# Patient Record
Sex: Female | Born: 1940
Health system: Southern US, Community
[De-identification: ages and names within clinical notes are randomized; demographics above are authoritative.]

## PROBLEM LIST (undated history)

## (undated) DIAGNOSIS — F32A Depression, unspecified: Secondary | ICD-10-CM

## (undated) DIAGNOSIS — E785 Hyperlipidemia, unspecified: Secondary | ICD-10-CM

## (undated) DIAGNOSIS — M1990 Unspecified osteoarthritis, unspecified site: Secondary | ICD-10-CM

## (undated) DIAGNOSIS — F419 Anxiety disorder, unspecified: Secondary | ICD-10-CM

## (undated) DIAGNOSIS — C801 Malignant (primary) neoplasm, unspecified: Secondary | ICD-10-CM

## (undated) DIAGNOSIS — M8580 Other specified disorders of bone density and structure, unspecified site: Secondary | ICD-10-CM

## (undated) HISTORY — DX: Other specified disorders of bone density and structure, unspecified site: M85.80

## (undated) HISTORY — PX: CARPAL TUNNEL RELEASE: SHX101

## (undated) HISTORY — DX: Anxiety disorder, unspecified: F41.9

## (undated) HISTORY — DX: Hyperlipidemia, unspecified: E78.5

## (undated) HISTORY — PX: DILATION AND CURETTAGE OF UTERUS: SHX78

---

## 1998-10-21 ENCOUNTER — Other Ambulatory Visit: Admission: RE | Admit: 1998-10-21 | Discharge: 1998-10-21 | Payer: Self-pay | Admitting: Obstetrics and Gynecology

## 1999-11-26 ENCOUNTER — Other Ambulatory Visit: Admission: RE | Admit: 1999-11-26 | Discharge: 1999-11-26 | Payer: Self-pay | Admitting: Obstetrics and Gynecology

## 2000-12-06 ENCOUNTER — Other Ambulatory Visit: Admission: RE | Admit: 2000-12-06 | Discharge: 2000-12-06 | Payer: Self-pay | Admitting: Obstetrics and Gynecology

## 2001-12-07 ENCOUNTER — Other Ambulatory Visit: Admission: RE | Admit: 2001-12-07 | Discharge: 2001-12-07 | Payer: Self-pay | Admitting: Obstetrics and Gynecology

## 2002-12-10 ENCOUNTER — Other Ambulatory Visit: Admission: RE | Admit: 2002-12-10 | Discharge: 2002-12-10 | Payer: Self-pay | Admitting: Obstetrics and Gynecology

## 2003-12-16 ENCOUNTER — Other Ambulatory Visit: Admission: RE | Admit: 2003-12-16 | Discharge: 2003-12-16 | Payer: Self-pay | Admitting: Obstetrics and Gynecology

## 2005-04-02 ENCOUNTER — Other Ambulatory Visit: Admission: RE | Admit: 2005-04-02 | Discharge: 2005-04-02 | Payer: Self-pay | Admitting: Obstetrics and Gynecology

## 2006-04-19 ENCOUNTER — Other Ambulatory Visit: Admission: RE | Admit: 2006-04-19 | Discharge: 2006-04-19 | Payer: Self-pay | Admitting: Obstetrics and Gynecology

## 2007-09-19 LAB — HM COLONOSCOPY

## 2008-04-04 LAB — HM PAP SMEAR

## 2010-06-24 ENCOUNTER — Telehealth: Payer: Self-pay | Admitting: Internal Medicine

## 2010-06-24 NOTE — Telephone Encounter (Signed)
Pt has medicare  and two additional supplement ins. Pt has rx with no additional refills. Pt would like to become a new pt. Can I sch?

## 2010-07-08 NOTE — Telephone Encounter (Signed)
Per Dr. Fabian Sharp- can do 7/24- at 1:45pm to 2pm

## 2010-07-09 NOTE — Telephone Encounter (Signed)
Pt aware of this appt.

## 2010-08-03 ENCOUNTER — Encounter: Payer: Self-pay | Admitting: Internal Medicine

## 2010-08-11 ENCOUNTER — Ambulatory Visit (INDEPENDENT_AMBULATORY_CARE_PROVIDER_SITE_OTHER): Payer: Medicare Other | Admitting: Internal Medicine

## 2010-08-11 ENCOUNTER — Encounter: Payer: Self-pay | Admitting: Internal Medicine

## 2010-08-11 VITALS — BP 110/60 | HR 66 | Ht 65.25 in | Wt 151.0 lb

## 2010-08-11 DIAGNOSIS — F419 Anxiety disorder, unspecified: Secondary | ICD-10-CM | POA: Insufficient documentation

## 2010-08-11 DIAGNOSIS — E785 Hyperlipidemia, unspecified: Secondary | ICD-10-CM

## 2010-08-11 DIAGNOSIS — Z79890 Hormone replacement therapy (postmenopausal): Secondary | ICD-10-CM

## 2010-08-11 DIAGNOSIS — M899 Disorder of bone, unspecified: Secondary | ICD-10-CM

## 2010-08-11 DIAGNOSIS — M8580 Other specified disorders of bone density and structure, unspecified site: Secondary | ICD-10-CM

## 2010-08-11 DIAGNOSIS — F411 Generalized anxiety disorder: Secondary | ICD-10-CM

## 2010-08-11 NOTE — Patient Instructions (Addendum)
Stop the glucosamine and see how your feel and if get increasing joint pains then  Restart to see  If this ireally helping you. I will review your record about advisability of lipid medications. And interventions. No need to take the red yeast  rice   For now.  Decrease hormones trial to 5 days a week.    Check labs in about 8 weeks and then Wellness visit .

## 2010-08-11 NOTE — Progress Notes (Signed)
  Subjective:    Patient ID: Ina Homes, female    DOB: Mar 05, 1940, 70 y.o.   MRN: [id number]  HPI  New patient  Comes in as a new patient visit and has a few ongoing medical issues . There are med records to review.  Past PCP  Was Dr Raquel James who is no longer in practice . Prev  Dr Ashley Royalty .Marland Kitchen LIPIDS:  On meds for about 18 months .  No problems taking it  Had  Declined statins.   Concern about se.   Low doze  zoloft for anxiety for a while.      Helps.  Red yeast rice   and lovaza .     On prempro" forever".   Had sx  When tried to  stop .   Other  Options  didnt work well.  Review of Systems ? Episode a of chest pain in the past .    ROS:  GEN/ HEENTNo fever, significant weight changes sweats headaches vision problems hearing changes, CV/ PULM; [place], syncope,edema  change in exercise tolerance. GI /GU: No adominal pain, vomiting, change in bowel habits. No blood in the stool. No significant GU symptoms. SKIN/HEME: ,no acute skin rashes suspicious lesions or bleeding. No lymphadenopathy, nodules, masses.  NEURO/ PSYCH:  No neurologic signs such as weakness numbness No depression anxiety. IMM/ Allergy: No unusual infections.  Allergy .   REST of 12 system review negative G4P2 UTD on  HCM parameters Ex light smoker   Reviewed  Data sheet    Objective:   Physical Exam Physical Exam: Vital signs reviewed [id number] is a well-developed well-nourished alert cooperative  white female who appears her stated age in no acute distress.  HEENT: normocephalic  atraumatic , Eyes: PERRL EOM's full, conjunctiva clear, Nares: paten,t no deformity discharge or tenderness., Ears: no deformity EAC's clear TMs with normal landmarks. Mouth: clear OP, no lesions, edema.  Moist mucous membranes. Dentition in adequate repair. NECK: supple without masses, thyromegaly or bruits. CHEST/PULM:  Clear to auscultation and percussion breath sounds equal no wheeze , rales or rhonchi. No chest wall deformities  or tenderness. CV: PMI is nondisplaced, S1 S2 no gallops, murmurs, rubs. Peripheral pulses are full without delay.No JVD .  ABDOMEN: Bowel sounds normal nontender  No guard or rebound, no hepato splenomegal no CVA tenderness.  No  Extremtities:  No clubbing cyanosis or edema, no acute joint swelling or redness no focal atrophy NEURO:  Oriented x3, cranial nerves 3-12 appear to be intact, no obvious focal weakness,gait within normal limits  SKIN: No acute rashes normal turgor, color, no bruising or petechiae. PSYCH: Oriented, good eye contact, no obvious depression anxiety, cognition and judgment appear normal. records     Assessment & Plan:  HRT   Does better on than off per patient Mood  Does well on current med regimen LIPIDS. Disc use of supplements  And lack of credible data  Risk benefit  And outcomes data.  She doesn't have high risk except for age . Osteopenia  Supplements .

## 2010-08-22 ENCOUNTER — Encounter: Payer: Self-pay | Admitting: Internal Medicine

## 2010-08-22 DIAGNOSIS — E785 Hyperlipidemia, unspecified: Secondary | ICD-10-CM | POA: Insufficient documentation

## 2010-08-22 DIAGNOSIS — M8580 Other specified disorders of bone density and structure, unspecified site: Secondary | ICD-10-CM | POA: Insufficient documentation

## 2010-08-22 DIAGNOSIS — F419 Anxiety disorder, unspecified: Secondary | ICD-10-CM | POA: Insufficient documentation

## 2010-08-22 DIAGNOSIS — Z79890 Hormone replacement therapy (postmenopausal): Secondary | ICD-10-CM | POA: Insufficient documentation

## 2010-09-07 ENCOUNTER — Other Ambulatory Visit: Payer: Self-pay | Admitting: Internal Medicine

## 2010-10-09 ENCOUNTER — Other Ambulatory Visit (INDEPENDENT_AMBULATORY_CARE_PROVIDER_SITE_OTHER): Payer: Medicare Other

## 2010-10-09 DIAGNOSIS — M899 Disorder of bone, unspecified: Secondary | ICD-10-CM

## 2010-10-09 DIAGNOSIS — E785 Hyperlipidemia, unspecified: Secondary | ICD-10-CM

## 2010-10-09 LAB — CBC WITH DIFFERENTIAL/PLATELET
Basophils Relative: 0.9 % (ref 0.0–3.0)
Eosinophils Relative: 3.4 % (ref 0.0–5.0)
Lymphocytes Relative: 27.2 % (ref 12.0–46.0)
MCV: 94.7 fl (ref 78.0–100.0)
Monocytes Absolute: 0.5 10*3/uL (ref 0.1–1.0)
Neutrophils Relative %: 58.4 % (ref 43.0–77.0)
Platelets: 280 10*3/uL (ref 150.0–400.0)
RBC: 4.08 Mil/uL (ref 3.87–5.11)
WBC: 5.2 10*3/uL (ref 4.5–10.5)

## 2010-10-09 LAB — LIPID PANEL
Cholesterol: 170 mg/dL (ref 0–200)
LDL Cholesterol: 102 mg/dL — ABNORMAL HIGH (ref 0–99)
Total CHOL/HDL Ratio: 4
Triglycerides: 117 mg/dL (ref 0.0–149.0)
VLDL: 23.4 mg/dL (ref 0.0–40.0)

## 2010-10-09 LAB — HEPATIC FUNCTION PANEL
ALT: 68 U/L — ABNORMAL HIGH (ref 0–35)
Alkaline Phosphatase: 44 U/L (ref 39–117)
Bilirubin, Direct: 0 mg/dL (ref 0.0–0.3)
Total Bilirubin: 0.4 mg/dL (ref 0.3–1.2)
Total Protein: 7.3 g/dL (ref 6.0–8.3)

## 2010-10-09 LAB — BASIC METABOLIC PANEL
BUN: 18 mg/dL (ref 6–23)
Calcium: 9.1 mg/dL (ref 8.4–10.5)
Creatinine, Ser: 0.7 mg/dL (ref 0.4–1.2)
GFR: 92.46 mL/min (ref >60.00)

## 2010-10-15 ENCOUNTER — Encounter: Payer: Medicare Other | Admitting: Internal Medicine

## 2010-10-27 ENCOUNTER — Encounter: Payer: Self-pay | Admitting: Internal Medicine

## 2010-10-27 ENCOUNTER — Ambulatory Visit (INDEPENDENT_AMBULATORY_CARE_PROVIDER_SITE_OTHER): Payer: Medicare Other | Admitting: Internal Medicine

## 2010-10-27 DIAGNOSIS — Z0000 Encounter for general adult medical examination without abnormal findings: Secondary | ICD-10-CM

## 2010-10-27 DIAGNOSIS — F419 Anxiety disorder, unspecified: Secondary | ICD-10-CM

## 2010-10-27 DIAGNOSIS — Z23 Encounter for immunization: Secondary | ICD-10-CM

## 2010-10-27 DIAGNOSIS — Z136 Encounter for screening for cardiovascular disorders: Secondary | ICD-10-CM

## 2010-10-27 DIAGNOSIS — E785 Hyperlipidemia, unspecified: Secondary | ICD-10-CM

## 2010-10-27 DIAGNOSIS — Z79890 Hormone replacement therapy (postmenopausal): Secondary | ICD-10-CM

## 2010-10-27 DIAGNOSIS — R945 Abnormal results of liver function studies: Secondary | ICD-10-CM

## 2010-10-27 DIAGNOSIS — F411 Generalized anxiety disorder: Secondary | ICD-10-CM

## 2010-10-27 DIAGNOSIS — M949 Disorder of cartilage, unspecified: Secondary | ICD-10-CM

## 2010-10-27 DIAGNOSIS — M8580 Other specified disorders of bone density and structure, unspecified site: Secondary | ICD-10-CM

## 2010-10-27 NOTE — Patient Instructions (Addendum)
Avoid advil aleve . Alcohol.   Check labs in 1 month and then plan follow up  After that.   Wean hormone. To 3- 4 days per week as discussed . Get sleep.

## 2010-10-27 NOTE — Progress Notes (Signed)
Subjective:    Summer Collins is a 70 y.o. female who presents for a  Annual Medicare Wellness Visit. And other problems  Patient comes in today for preventive visit and follow-up of medical issues. Decreasing  hrt .   Update of her history since her last visit. Stopped the fish oil and glucosamine  And less hrt Has seen eye doc Cardiac risk factors: advanced age (older than 35 for men, 20 for women) and dyslipidemia.  Activities of Daily Living  In your present state of health, do you have any difficulty performing the following activities?:  Preparing food and eating?: No Bathing yourself: No Getting dressed: No Using the toilet:No Moving around from place to place: No In the past year have you fallen or had a near fall?:Yes  Current exercise habits: The patient does not participate in regular exercise at present.   Dietary issues discussed: healthy eating and exercise  Depression Screen (Note: if answer to either of the following is "Yes", then a more complete depression screening is indicated)  Q1: Over the past two weeks, have you felt down, depressed or hopeless?no Q2: Over the past two weeks, have you felt little interest or pleasure in doing things? no    Hearing: good  Vision:  No limitations at present .  roseacea of eyes   And had steroid treatment and  Another drop.  Safety:  Has smoke detector and wears seat belts.  No firearms. No excess sun exposure. Sees dentist regularly.  Falls: no fall of injury in last 6 months  Advance directive :  Reviewed  Has one.  Memory: Felt to be good  , no concern from her or her family.  Depression: No anhedonia unusual crying or depressive symptoms  Nutrition: Eats well balanced diet; adequate calcium and vitamin D. No swallowing chewiing problems.  Injury: no major injuries in the last six months.  Other healthcare providers:  Reviewed today .  Social:  Lives   With husband  . No pets.   Preventive parameters:  up-to-date on colonoscopy, mammogram, immunizations. Including Tdap and pneumovax.  ADLS:   There are no problems or need for assistance  driving, feeding, obtaining food, dressing, toileting and bathing, managing money using phone. She is independent.    Review of Systems ROS:  GEN/ HEENTNo fever, significant weight changes sweats headaches vision problems hearing changes, CV/ PULM; No chest pain shortness of breath cough, syncope,edema  change in exercise tolerance. GI /GU: No adominal pain, vomiting, change in bowel habits. No blood in the stool. No significant GU symptoms. SKIN/HEME: ,no acute skin rashes suspicious lesions or bleeding. No lymphadenopathy, nodules, masses.  NEURO/ PSYCH:  No neurologic signs such as weakness numbness No depression anxiety. IMM/ Allergy: No unusual infections.  Allergy .   REST of 12 system review negative  Past history family history social history reviewed in the electronic medical record.    Objective:     Vision done 09/19/2010 normal except for roceace of the eyeballs done by unsure of name. Blood pressure 110/70, pulse 70, height 5' 5.5" (1.664 m), weight 154 lb (69.854 kg). Body mass index is 25.24 kg/(m^2). Physical Exam: Vital signs reviewed [id number] is a well-developed well-nourished alert cooperative  white female who appears her stated age in no acute distress.  HEENT: normocephalic  traumatic , Eyes: PERRL EOM's full, conjunctiva clear, Nares: paten,t no deformity discharge or tenderness., Ears: no deformity EAC's clear TMs with normal landmarks. Mouth: clear OP, no lesions, edema.  Moist mucous membranes. Dentition in adequate repair. NECK: supple without masses, thyromegaly or bruits. Breast: normal by inspection . No dimpling, discharge, masses, tenderness or discharge .  CHEST/PULM:  Clear to auscultation and percussion breath sounds equal no wheeze , rales or rhonchi. No chest wall deformities or tenderness. CV: PMI is  nondisplaced, S1 S2 no gallops, murmurs, rubs. Peripheral pulses are full without delay.No JVD .  ABDOMEN: Bowel sounds normal nontender  No guard or rebound, no hepato splenomegal no CVA tenderness.  No hernia. Extremtities:  No clubbing cyanosis or edema, no acute joint swelling or redness no focal atrophy NEURO:  Oriented x3, cranial nerves 3-12 appear to be intact, no obvious focal weakness,gait within normal limits no abnormal reflexes or asymmetrical SKIN: No acute rashes normal turgor, color, no bruising or petechiae. PSYCH: Oriented, good eye contact, no obvious depression anxiety, cognition and judgment appear normal.   Pelvic: NL ext GU, labia clear without lesions or rash . Vagina no lesions . UTERUS: Neg CMT Adnexa:  clear no masses,   EKG nsr no acutefindings   Assessment:   Preventive Health Care Counseled regarding healthy nutrition, exercise, sleep, injury prevention, calcium vit d and healthy weight .  LIPIDS taking meds no se  LFTS  No excessive etoh some nsaid tylenol for ms pain HRT weaning Osteopenia hx no fracture  Mood hrt  Ok so far  Abnormal lfts  Minor but needs fu no hx of ivdu risk of hep etc  recheck     Plan:    Seeabove During the course of the visit the patient was educated and counseled about appropriate screening and preventive services including:     Patient Instructions (the written plan) was given to the patient.   Medicare Attestation I have personally reviewed: The patient's medical and social history Their use of alcohol, tobacco or illicit drugs Their current medications and supplements The patient's functional ability including ADLs,fall risks, home safety risks, cognitive, and hearing and visual impairment Diet and physical activities Evidence for depression or mood disorders  The patient's weight, height, BMI, and visual acuity have been recorded in the chart.  I have made referrals, counseling, and provided education to the patient  based on review of the above and I have provided the patient with a written personalized care plan for preventive services.

## 2010-10-31 DIAGNOSIS — Z0000 Encounter for general adult medical examination without abnormal findings: Secondary | ICD-10-CM | POA: Insufficient documentation

## 2010-10-31 DIAGNOSIS — R945 Abnormal results of liver function studies: Secondary | ICD-10-CM | POA: Insufficient documentation

## 2010-10-31 DIAGNOSIS — Z136 Encounter for screening for cardiovascular disorders: Secondary | ICD-10-CM | POA: Insufficient documentation

## 2010-11-02 DIAGNOSIS — Z23 Encounter for immunization: Secondary | ICD-10-CM

## 2010-11-23 ENCOUNTER — Other Ambulatory Visit (INDEPENDENT_AMBULATORY_CARE_PROVIDER_SITE_OTHER): Payer: Medicare Other

## 2010-11-23 DIAGNOSIS — R945 Abnormal results of liver function studies: Secondary | ICD-10-CM

## 2010-11-23 DIAGNOSIS — K769 Liver disease, unspecified: Secondary | ICD-10-CM

## 2010-11-23 LAB — HEPATIC FUNCTION PANEL
ALT: 38 U/L — ABNORMAL HIGH (ref 0–35)
Bilirubin, Direct: 0 mg/dL (ref 0.0–0.3)
Total Bilirubin: 0.4 mg/dL (ref 0.3–1.2)
Total Protein: 7.4 g/dL (ref 6.0–8.3)

## 2010-11-24 ENCOUNTER — Other Ambulatory Visit: Payer: Medicare Other

## 2010-11-24 LAB — HEPATITIS B SURFACE ANTIGEN: Hepatitis B Surface Ag: NEGATIVE

## 2010-11-24 LAB — HEPATITIS C ANTIBODY: HCV Ab: NEGATIVE

## 2010-11-24 LAB — HEPATITIS B SURFACE ANTIBODY,QUALITATIVE: Hep B S Ab: NEGATIVE

## 2010-11-27 ENCOUNTER — Encounter: Payer: Self-pay | Admitting: *Deleted

## 2010-11-27 ENCOUNTER — Other Ambulatory Visit: Payer: Self-pay | Admitting: Internal Medicine

## 2010-11-27 DIAGNOSIS — R945 Abnormal results of liver function studies: Secondary | ICD-10-CM

## 2011-01-05 ENCOUNTER — Other Ambulatory Visit: Payer: Self-pay | Admitting: Internal Medicine

## 2011-01-05 MED ORDER — CONJ ESTROG-MEDROXYPROGEST ACE 0.3-1.5 MG PO TABS: 1.0000 | ORAL_TABLET | Freq: Every day | ORAL | Status: DC

## 2011-01-05 NOTE — Telephone Encounter (Signed)
Pt need refills on prempro call into [facility]

## 2011-01-11 ENCOUNTER — Telehealth: Payer: Self-pay | Admitting: *Deleted

## 2011-01-11 MED ORDER — SERTRALINE HCL 50 MG PO TABS: 50.0000 mg | ORAL_TABLET | Freq: Every day | ORAL | Status: DC

## 2011-01-11 NOTE — Telephone Encounter (Signed)
Refill on zoloft  

## 2011-04-08 DIAGNOSIS — H01009 Unspecified blepharitis unspecified eye, unspecified eyelid: Secondary | ICD-10-CM | POA: Diagnosis not present

## 2011-04-08 DIAGNOSIS — L719 Rosacea, unspecified: Secondary | ICD-10-CM | POA: Diagnosis not present

## 2011-07-29 DIAGNOSIS — Z1231 Encounter for screening mammogram for malignant neoplasm of breast: Secondary | ICD-10-CM | POA: Diagnosis not present

## 2011-08-05 ENCOUNTER — Encounter: Payer: Self-pay | Admitting: Internal Medicine

## 2011-08-15 ENCOUNTER — Other Ambulatory Visit: Payer: Self-pay | Admitting: Internal Medicine

## 2011-08-16 DIAGNOSIS — C44611 Basal cell carcinoma of skin of unspecified upper limb, including shoulder: Secondary | ICD-10-CM | POA: Diagnosis not present

## 2011-08-16 DIAGNOSIS — D485 Neoplasm of uncertain behavior of skin: Secondary | ICD-10-CM | POA: Diagnosis not present

## 2011-08-16 DIAGNOSIS — L821 Other seborrheic keratosis: Secondary | ICD-10-CM | POA: Diagnosis not present

## 2011-08-16 DIAGNOSIS — D2330 Other benign neoplasm of skin of unspecified part of face: Secondary | ICD-10-CM | POA: Diagnosis not present

## 2011-08-17 NOTE — Telephone Encounter (Signed)
Needs refills.  Last seen 10-27-10.  No fu scheduled.  Please advise.

## 2011-08-18 NOTE — Telephone Encounter (Signed)
Due for visit in October  Have her schedule appt then and refill med until then

## 2011-08-27 DIAGNOSIS — L570 Actinic keratosis: Secondary | ICD-10-CM | POA: Diagnosis not present

## 2011-08-27 DIAGNOSIS — C44611 Basal cell carcinoma of skin of unspecified upper limb, including shoulder: Secondary | ICD-10-CM | POA: Diagnosis not present

## 2011-09-10 ENCOUNTER — Other Ambulatory Visit: Payer: Self-pay | Admitting: Internal Medicine

## 2011-09-10 NOTE — Telephone Encounter (Signed)
Needs CPE scheduled for Oct.

## 2011-09-13 NOTE — Telephone Encounter (Signed)
Pt is sch for 12-15-2011. Pt is out of med

## 2011-10-04 DIAGNOSIS — H01009 Unspecified blepharitis unspecified eye, unspecified eyelid: Secondary | ICD-10-CM | POA: Diagnosis not present

## 2011-10-04 DIAGNOSIS — H2510 Age-related nuclear cataract, unspecified eye: Secondary | ICD-10-CM | POA: Diagnosis not present

## 2011-10-04 DIAGNOSIS — L719 Rosacea, unspecified: Secondary | ICD-10-CM | POA: Diagnosis not present

## 2011-12-13 ENCOUNTER — Other Ambulatory Visit: Payer: Self-pay | Admitting: Internal Medicine

## 2011-12-15 ENCOUNTER — Encounter: Payer: Self-pay | Admitting: Internal Medicine

## 2011-12-15 ENCOUNTER — Ambulatory Visit (INDEPENDENT_AMBULATORY_CARE_PROVIDER_SITE_OTHER): Payer: Medicare Other | Admitting: Internal Medicine

## 2011-12-15 VITALS — BP 124/70 | HR 73 | Temp 98.4°F | Ht 65.5 in | Wt 154.0 lb

## 2011-12-15 DIAGNOSIS — Z23 Encounter for immunization: Secondary | ICD-10-CM

## 2011-12-15 DIAGNOSIS — L719 Rosacea, unspecified: Secondary | ICD-10-CM | POA: Diagnosis not present

## 2011-12-15 DIAGNOSIS — Z0000 Encounter for general adult medical examination without abnormal findings: Secondary | ICD-10-CM

## 2011-12-15 DIAGNOSIS — Z01419 Encounter for gynecological examination (general) (routine) without abnormal findings: Secondary | ICD-10-CM | POA: Diagnosis not present

## 2011-12-15 DIAGNOSIS — F419 Anxiety disorder, unspecified: Secondary | ICD-10-CM

## 2011-12-15 DIAGNOSIS — F411 Generalized anxiety disorder: Secondary | ICD-10-CM

## 2011-12-15 DIAGNOSIS — M949 Disorder of cartilage, unspecified: Secondary | ICD-10-CM

## 2011-12-15 DIAGNOSIS — Z85828 Personal history of other malignant neoplasm of skin: Secondary | ICD-10-CM | POA: Insufficient documentation

## 2011-12-15 DIAGNOSIS — Z79890 Hormone replacement therapy (postmenopausal): Secondary | ICD-10-CM | POA: Diagnosis not present

## 2011-12-15 DIAGNOSIS — M899 Disorder of bone, unspecified: Secondary | ICD-10-CM | POA: Diagnosis not present

## 2011-12-15 DIAGNOSIS — L718 Other rosacea: Secondary | ICD-10-CM | POA: Insufficient documentation

## 2011-12-15 DIAGNOSIS — M8580 Other specified disorders of bone density and structure, unspecified site: Secondary | ICD-10-CM

## 2011-12-15 DIAGNOSIS — E785 Hyperlipidemia, unspecified: Secondary | ICD-10-CM

## 2011-12-15 LAB — HEPATIC FUNCTION PANEL
ALT: 37 U/L — ABNORMAL HIGH (ref 0–35)
AST: 31 U/L (ref 0–37)
Albumin: 4 g/dL (ref 3.5–5.2)
Alkaline Phosphatase: 38 U/L — ABNORMAL LOW (ref 39–117)
Total Protein: 7.7 g/dL (ref 6.0–8.3)

## 2011-12-15 LAB — CBC WITH DIFFERENTIAL/PLATELET
Basophils Absolute: 0.1 10*3/uL (ref 0.0–0.1)
Basophils Relative: 1 % (ref 0.0–3.0)
Eosinophils Relative: 2.7 % (ref 0.0–5.0)
HCT: 40.5 % (ref 36.0–46.0)
Hemoglobin: 13.4 g/dL (ref 12.0–15.0)
Lymphocytes Relative: 34.1 % (ref 12.0–46.0)
Lymphs Abs: 1.9 10*3/uL (ref 0.7–4.0)
Monocytes Relative: 11.2 % (ref 3.0–12.0)
Neutro Abs: 2.9 10*3/uL (ref 1.4–7.7)
RBC: 4.34 Mil/uL (ref 3.87–5.11)
RDW: 13.3 % (ref 11.5–14.6)
WBC: 5.7 10*3/uL (ref 4.5–10.5)

## 2011-12-15 LAB — BASIC METABOLIC PANEL
CO2: 27 mEq/L (ref 19–32)
Calcium: 9.5 mg/dL (ref 8.4–10.5)
Creatinine, Ser: 0.7 mg/dL (ref 0.4–1.2)
GFR: 89.07 mL/min (ref >60.00)
Sodium: 138 mEq/L (ref 135–145)

## 2011-12-15 LAB — LIPID PANEL
Cholesterol: 161 mg/dL (ref 0–200)
VLDL: 27.2 mg/dL (ref 0.0–40.0)

## 2011-12-15 LAB — TSH: TSH: 2.48 u[IU]/mL (ref 0.35–5.50)

## 2011-12-15 NOTE — Patient Instructions (Signed)
Continue minimize hrt as needed.  Exercise can help  Maintain bone health  And cardio vascular health.  Will notify you  of labs when available.  Preventive Care for Adults, Female A healthy lifestyle and preventive care can promote health and wellness. Preventive health guidelines for women include the following key practices.  A routine yearly physical is a good way to check with your caregiver about your health and preventive screening. It is a chance to share any concerns and updates on your health, and to receive a thorough exam.  Visit your dentist for a routine exam and preventive care every 6 months. Brush your teeth twice a day and floss once a day. Good oral hygiene prevents tooth decay and gum disease.  The frequency of eye exams is based on your age, health, family medical history, use of contact lenses, and other factors. Follow your caregiver's recommendations for frequency of eye exams.  Eat a healthy diet. Foods like vegetables, fruits, whole grains, low-fat dairy products, and lean protein foods contain the nutrients you need without too many calories. Decrease your intake of foods high in solid fats, added sugars, and salt. Eat the right amount of calories for you.Get information about a proper diet from your caregiver, if necessary.  Regular physical exercise is one of the most important things you can do for your health. Most adults should get at least 150 minutes of moderate-intensity exercise (any activity that increases your heart rate and causes you to sweat) each week. In addition, most adults need muscle-strengthening exercises on 2 or more days a week.  Maintain a healthy weight. The body mass index (BMI) is a screening tool to identify possible weight problems. It provides an estimate of body fat based on height and weight. Your caregiver can help determine your BMI, and can help you achieve or maintain a healthy weight.For adults 20 years and older:  A BMI below 18.5  is considered underweight.  A BMI of 18.5 to 24.9 is normal.  A BMI of 25 to 29.9 is considered overweight.  A BMI of 30 and above is considered obese.  Maintain normal blood lipids and cholesterol levels by exercising and minimizing your intake of saturated fat. Eat a balanced diet with plenty of fruit and vegetables. Blood tests for lipids and cholesterol should begin at age 64 and be repeated every 5 years. If your lipid or cholesterol levels are high, you are over 50, or you are at high risk for heart disease, you may need your cholesterol levels checked more frequently.Ongoing high lipid and cholesterol levels should be treated with medicines if diet and exercise are not effective.  If you smoke, find out from your caregiver how to quit. If you do not use tobacco, do not start.  If you are pregnant, do not drink alcohol. If you are breastfeeding, be very cautious about drinking alcohol. If you are not pregnant and choose to drink alcohol, do not exceed 1 drink per day. One drink is considered to be 12 ounces (355 mL) of beer, 5 ounces (148 mL) of wine, or 1.5 ounces (44 mL) of liquor.  Avoid use of street drugs. Do not share needles with anyone. Ask for help if you need support or instructions about stopping the use of drugs.  High blood pressure causes heart disease and increases the risk of stroke. Your blood pressure should be checked at least every 1 to 2 years. Ongoing high blood pressure should be treated with medicines if weight  loss and exercise are not effective.  If you are 30 to 71 years old, ask your caregiver if you should take aspirin to prevent strokes.  Diabetes screening involves taking a blood sample to check your fasting blood sugar level. This should be done once every 3 years, after age 24, if you are within normal weight and without risk factors for diabetes. Testing should be considered at a younger age or be carried out more frequently if you are overweight and have  at least 1 risk factor for diabetes.  Breast cancer screening is essential preventive care for women. You should practice "breast self-awareness." This means understanding the normal appearance and feel of your breasts and may include breast self-examination. Any changes detected, no matter how small, should be reported to a caregiver. Women in their 33s and 30s should have a clinical breast exam (CBE) by a caregiver as part of a regular health exam every 1 to 3 years. After age 30, women should have a CBE every year. Starting at age 60, women should consider having a mammography (breast X-ray test) every year. Women who have a family history of breast cancer should talk to their caregiver about genetic screening. Women at a high risk of breast cancer should talk to their caregivers about having magnetic resonance imaging (MRI) and a mammography every year.  The Pap test is a screening test for cervical cancer. A Pap test can show cell changes on the cervix that might become cervical cancer if left untreated. A Pap test is a procedure in which cells are obtained and examined from the lower end of the uterus (cervix).  Women should have a Pap test starting at age 70.  Between ages 88 and 42, Pap tests should be repeated every 2 years.  Beginning at age 73, you should have a Pap test every 3 years as long as the past 3 Pap tests have been normal.  Some women have medical problems that increase the chance of getting cervical cancer. Talk to your caregiver about these problems. It is especially important to talk to your caregiver if a new problem develops soon after your last Pap test. In these cases, your caregiver may recommend more frequent screening and Pap tests.  The above recommendations are the same for women who have or have not gotten the vaccine for human papillomavirus (HPV).  If you had a hysterectomy for a problem that was not cancer or a condition that could lead to cancer, then you no  longer need Pap tests. Even if you no longer need a Pap test, a regular exam is a good idea to make sure no other problems are starting.  If you are between ages 55 and 34, and you have had normal Pap tests going back 10 years, you no longer need Pap tests. Even if you no longer need a Pap test, a regular exam is a good idea to make sure no other problems are starting.  If you have had past treatment for cervical cancer or a condition that could lead to cancer, you need Pap tests and screening for cancer for at least 20 years after your treatment.  If Pap tests have been discontinued, risk factors (such as a new sexual partner) need to be reassessed to determine if screening should be resumed.  The HPV test is an additional test that may be used for cervical cancer screening. The HPV test looks for the virus that can cause the cell changes on the cervix.  The cells collected during the Pap test can be tested for HPV. The HPV test could be used to screen women aged 70 years and older, and should be used in women of any age who have unclear Pap test results. After the age of 38, women should have HPV testing at the same frequency as a Pap test.  Colorectal cancer can be detected and often prevented. Most routine colorectal cancer screening begins at the age of 57 and continues through age 38. However, your caregiver may recommend screening at an earlier age if you have risk factors for colon cancer. On a yearly basis, your caregiver may provide home test kits to check for hidden blood in the stool. Use of a small camera at the end of a tube, to directly examine the colon (sigmoidoscopy or colonoscopy), can detect the earliest forms of colorectal cancer. Talk to your caregiver about this at age 76, when routine screening begins. Direct examination of the colon should be repeated every 5 to 10 years through age 60, unless early forms of pre-cancerous polyps or small growths are found.  Hepatitis C blood  testing is recommended for all people born from [birth year] through 1965 and any individual with known risks for hepatitis C.  Practice safe sex. Use condoms and avoid high-risk sexual practices to reduce the spread of sexually transmitted infections (STIs). STIs include gonorrhea, chlamydia, syphilis, trichomonas, herpes, HPV, and human immunodeficiency virus (HIV). Herpes, HIV, and HPV are viral illnesses that have no cure. They can result in disability, cancer, and death. Sexually active women aged 80 and younger should be checked for chlamydia. Older women with new or multiple partners should also be tested for chlamydia. Testing for other STIs is recommended if you are sexually active and at increased risk.  Osteoporosis is a disease in which the bones lose minerals and strength with aging. This can result in serious bone fractures. The risk of osteoporosis can be identified using a bone density scan. Women ages 48 and over and women at risk for fractures or osteoporosis should discuss screening with their caregivers. Ask your caregiver whether you should take a calcium supplement or vitamin D to reduce the rate of osteoporosis.  Menopause can be associated with physical symptoms and risks. Hormone replacement therapy is available to decrease symptoms and risks. You should talk to your caregiver about whether hormone replacement therapy is right for you.  Use sunscreen with sun protection factor (SPF) of 30 or more. Apply sunscreen liberally and repeatedly throughout the day. You should seek shade when your shadow is shorter than you. Protect yourself by wearing long sleeves, pants, a wide-brimmed hat, and sunglasses year round, whenever you are outdoors.  Once a month, do a whole body skin exam, using a mirror to look at the skin on your back. Notify your caregiver of new moles, moles that have irregular borders, moles that are larger than a pencil eraser, or moles that have changed in shape or  color.  Stay current with required immunizations.  Influenza. You need a dose every fall (or winter). The composition of the flu vaccine changes each year, so being vaccinated once is not enough.  Pneumococcal polysaccharide. You need 1 to 2 doses if you smoke cigarettes or if you have certain chronic medical conditions. You need 1 dose at age 34 (or older) if you have never been vaccinated.  Tetanus, diphtheria, pertussis (Tdap, Td). Get 1 dose of Tdap vaccine if you are younger than age 69, are over  65 and have contact with an infant, are a Research scientist (physical sciences), are pregnant, or simply want to be protected from whooping cough. After that, you need a Td booster dose every 10 years. Consult your caregiver if you have not had at least 3 tetanus and diphtheria-containing shots sometime in your life or have a deep or dirty wound.  HPV. You need this vaccine if you are a woman age 23 or younger. The vaccine is given in 3 doses over 6 months.  Measles, mumps, rubella (MMR). You need at least 1 dose of MMR if you were born in 1957 or later. You may also need a second dose.  Meningococcal. If you are age 68 to 45 and a first-year college student living in a residence hall, or have one of several medical conditions, you need to get vaccinated against meningococcal disease. You may also need additional booster doses.  Zoster (shingles). If you are age 80 or older, you should get this vaccine.  Varicella (chickenpox). If you have never had chickenpox or you were vaccinated but received only 1 dose, talk to your caregiver to find out if you need this vaccine.  Hepatitis A. You need this vaccine if you have a specific risk factor for hepatitis A virus infection or you simply wish to be protected from this disease. The vaccine is usually given as 2 doses, 6 to 18 months apart.  Hepatitis B. You need this vaccine if you have a specific risk factor for hepatitis B virus infection or you simply wish to be  protected from this disease. The vaccine is given in 3 doses, usually over 6 months. Preventive Services / Frequency Ages 58 and over  Blood pressure check.** / Every 1 to 2 years.  Lipid and cholesterol check.** / Every 5 years beginning at age 66.  Clinical breast exam.** / Every year after age 55.  Mammogram.** / Every year beginning at age 72 and continuing for as long as you are in good health. Consult with your caregiver.  Pap test.** / Every 3 years starting at age 90 through age 50 or 21 with a 3 consecutive normal Pap tests. Testing can be stopped between 65 and 70 with 3 consecutive normal Pap tests and no abnormal Pap or HPV tests in the past 10 years.  HPV screening.** / Every 3 years from ages 74 through ages 67 or 28 with a history of 3 consecutive normal Pap tests. Testing can be stopped between 65 and 70 with 3 consecutive normal Pap tests and no abnormal Pap or HPV tests in the past 10 years.  Fecal occult blood test (FOBT) of stool. / Every year beginning at age 15 and continuing until age 33. You may not need to do this test if you get a colonoscopy every 10 years.  Flexible sigmoidoscopy or colonoscopy.** / Every 5 years for a flexible sigmoidoscopy or every 10 years for a colonoscopy beginning at age 56 and continuing until age 36.  Hepatitis C blood test.** / For all people born from [birth year] through 1965 and any individual with known risks for hepatitis C.  Osteoporosis screening.** / A one-time screening for women ages 30 and over and women at risk for fractures or osteoporosis.  Skin self-exam. / Monthly.  Influenza immunization.** / Every year.  Pneumococcal polysaccharide immunization.** / 1 dose at age 59 (or older) if you have never been vaccinated.  Tetanus, diphtheria, pertussis (Tdap, Td) immunization. / A one-time dose of Tdap vaccine if you are  over 65 and have contact with an infant, are a Research scientist (physical sciences), or simply want to be protected from whooping  cough. After that, you need a Td booster dose every 10 years.  Varicella immunization.** / Consult your caregiver.  Meningococcal immunization.** / Consult your caregiver.  Hepatitis A immunization.** / Consult your caregiver. 2 doses, 6 to 18 months apart.  Hepatitis B immunization.** / Check with your caregiver. 3 doses, usually over 6 months. ** Family history and personal history of risk and conditions may change your caregiver's recommendations. Document Released: 03/02/2001 Document Revised: 03/29/2011 Document Reviewed: 06/01/2010 [facility] Patient Information 2013 [place], [place].

## 2011-12-15 NOTE — Progress Notes (Signed)
Chief Complaint  Patient presents with  . Annual Exam    Medicare    HPI: Patient comes in today for Preventive Medicare wellness visit . And medication management .  No major injuries, ed visits ,hospitalizations , new medications since last visit. Sees opthalmology  for ocular rosacea and dermatologist she had a skin cancer removed from her right arm. Increase med of Zoloft during x mas. This is to help with maintaining her mood stability she feels the Zoloft has helped her. Lipids continuing the TriCor due for lab tests. Hormone replacement therapy has gone down on the dosing and noticed increased hot flashes so staying on the lowest dose most days of the week. Wants to stay on the medication states that she is aware of risk.   Hearing:  Ok  Vision:  No limitations at present .  Safety:  Has smoke detector and wears seat belts.  No firearms. No excess sun exposure. Sees dentist regularly.  Falls: NONE   Advance directive :  Reviewed  Has one.  Memory: Felt to be good  , no concern from her or her family.  Depression: No anhedonia unusual crying or depressive symptoms  Nutrition: Eats well balanced diet; adequate calcium and vitamin D. No swallowing chewiing problems.  Injury: no major injuries in the last six months.  Other healthcare providers:  Reviewed today .  Social:  Lives with husband married. No pets.   Preventive parameters: up-to-date on colonoscopy, mammogram, immunizations. Including Tdap and pneumovax.  ADLS:   There are no problems or need for assistance  driving, feeding, obtaining food, dressing, toileting and bathing, managing money using phone. She is independent.  etoh few per week.  Remote tobacco off for 15 about 20 years.  sneaky  Exercise not regulary    ROS:  GEN/ HEENT: No fever, significant weight changes sweats headaches vision problems hearing changes, CV/ PULM; No  shortness of breath cough, syncope,edema  change in exercise tolerance. Had   episode of nocturnal chest pain without associated cyst symptoms took aspirin no heartburn per se no chest pain dyspnea on exertion that is change from baseline GI /GU: No adominal pain, vomiting, change in bowel habits. No blood in the stool. No significant GU symptoms. SKIN/HEME: ,no acute skin rashes suspicious lesions or bleeding. No lymphadenopathy, nodules, masses.  NEURO/ PSYCH:  No neurologic signs such as weakness numbness. No depression anxiety. Controlled by medication. IMM/ Allergy: No unusual infections.  Allergy .   REST of 12 system review negative except as per HPI   Past Medical History  Diagnosis Date  . Hyperlipidemia   . Anxiety      on low dose zoloft for a while  . Vitamin D deficiency   . Osteopenia     Family History  Problem Relation Age of Onset  . Cancer Father     prostate and spread    History   Social History  . Marital Status: Married    Spouse Name: N/A    Number of Children: N/A  . Years of Education: N/A   Social History Main Topics  . Smoking status: Former Games developer  . Smokeless tobacco: None  . Alcohol Use: 0.5 oz/week    1 drink(s) per week  . Drug Use: No  . Sexually Active: None   Other Topics Concern  . 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    Outpatient Encounter Prescriptions as of 12/15/2011  Medication Sig Dispense Refill  . Calcium Carbonate-Vitamin D (CALCIUM 600 + D PO) Take by mouth. 1 bid         . cholecalciferol (VITAMIN D) 1000 UNITS tablet Take 1,000 Units by mouth daily. 1 daily or bid        . Cyanocobalamin (VITAMIN B 12 PO) Take by mouth. daily       . estrogen, conjugated,-medroxyprogesterone (PREMPRO) 0.3-1.5 MG per tablet Take 1 tablet by mouth daily. Takes 5 days out of the week  30 tablet  11  . fenofibrate (TRICOR) 145 MG tablet TAKE 1 TABLET BY MOUTH  EVERY DAY  90 tablet  0  . sertraline (ZOLOFT) 50 MG tablet TAKE 1 TABLET (50 MG TOTAL) BY MOUTH DAILY.  30 tablet  3  . [DISCONTINUED] fenofibrate (TRICOR) 145 MG tablet TAKE 1 TABLET BY MOUTH EVERY DAY  90 tablet  0     EXAM:  BP 124/70  Pulse 73  Temp 98.4 F (36.9 C) (Oral)  Ht 5' 5.5" (1.664 m)  Wt 154 lb (69.854 kg)  BMI 25.24 kg/m2  SpO2 98%  Body mass index is 25.24 kg/(m^2).  Physical Exam: Vital signs reviewed MWU:XLKG is a well-developed well-nourished alert cooperative   female who appears her stated age in no acute distress.  HEENT: normocephalic atraumatic , Eyes: PERRL EOM's full, conjunctiva clear, Nares: paten,t no deformity discharge or tenderness., Ears: no deformity EAC's clear TMs with normal landmarks. Mouth: clear OP, no lesions, edema.  Moist mucous membranes. Dentition in adequate repair. NECK: supple without masses, thyromegaly or bruits. CHEST/PULM:  Clear to auscultation and percussion breath sounds equal no wheeze , rales or rhonchi. No chest wall deformities or tenderness. Breast; no nodules discharge or dimpling. Axilla is clear CV: PMI is nondisplaced, S1 S2 no gallops, murmurs, rubs. Peripheral pulses are full without delay.No JVD .  ABDOMEN: Bowel sounds normal nontender  No guard or rebound, no hepato splenomegal no CVA tenderness.  No hernia. Extremtities:  No clubbing cyanosis or edema, no acute joint swelling or redness no focal atrophy NEURO:  Oriented x3, cranial nerves 3-12 appear to be intact, no obvious focal weakness,gait within normal limits no abnormal reflexes or asymmetrical SKIN: No acute rashes normal turgor, color, no bruising or petechiae. Sun actinic changes well-healed scar on right forearm. PSYCH: Oriented, good eye contact, no obvious depression anxiety, cognition and judgment appear normal. LN: no cervical axillary inguinal adenopathy Pelvic Pelvic: NL ext GU, labia clear without lesions or rash . Vagina no lesions .Cervix:  clear  UTERUS: Neg CMT Adnexa:  clear no masses . rectal negative masses  Oriented x 3 and no noted deficits in memory, attention, and speech.   Lab Results  Component Value Date   WBC 5.2 10/09/2010   HGB 12.9 10/09/2010   HCT 38.6 10/09/2010   PLT 280.0 10/09/2010   GLUCOSE 91 10/09/2010   CHOL 170 10/09/2010   TRIG 117.0 10/09/2010   HDL 44.90 10/09/2010   LDLCALC 102* 10/09/2010   ALT 38* 11/23/2010   AST 30 11/23/2010   NA 141 10/09/2010   K 4.6 10/09/2010   CL 108 10/09/2010   CREATININE 0.7 10/09/2010   BUN 18 10/09/2010   CO2 27 10/09/2010   TSH 2.61 10/09/2010    ASSESSMENT AND PLAN:  Discussed the following assessment and plan: Preventive Health Care Counseled regarding healthy nutrition, exercise, sleep,  injury prevention, calcium vit d and healthy weight .   1. Medicare annual wellness visit, subsequent    2. Preventative health care  Basic metabolic panel, CBC with Differential, Hepatic function panel, Lipid panel, TSH, Vitamin D 25 hydroxy  3. Hyperlipidemia  Basic metabolic panel, CBC with Differential, Hepatic function panel, Lipid panel, TSH, Vitamin D 25 hydroxy   on tricor for a number of years  checl levelstoday  4. Hormone replacement therapy (postmenopausal)  Basic metabolic panel, CBC with Differential, Hepatic function panel, Lipid panel, TSH, Vitamin D 25 hydroxy   had weaned  at lower dose had hot flushes   5. Anxiety  Basic metabolic panel, CBC with Differential, Hepatic function panel, Lipid panel, TSH, Vitamin D 25 hydroxy   mood controlled by sertraline  ok to continue   6. Need for prophylactic vaccination and inoculation against influenza  Basic metabolic panel, CBC with Differential, Hepatic function panel, Lipid panel, TSH, Vitamin D 25 hydroxy  7. Ocular rosacea  Basic metabolic panel, CBC with Differential, Hepatic function panel, Lipid panel, TSH, Vitamin D 25 hydroxy  8. Osteopenia  Basic metabolic panel, CBC with Differential, Hepatic function panel,  Lipid panel, TSH, Vitamin D 25 hydroxy   check vit d today   9. Hx of nonmelanoma skin cancer     flu  Patient Instructions  Continue minimize hrt as needed.  Exercise can help  Maintain bone health  And cardio vascular health.  Will notify you  of labs when available.  Preventive Care for Adults, Female A healthy lifestyle and preventive care can promote health and wellness. Preventive health guidelines for women include the following key practices.  A routine yearly physical is a good way to check with your caregiver about your health and preventive screening. It is a chance to share any concerns and updates on your health, and to receive a thorough exam.  Visit your dentist for a routine exam and preventive care every 6 months. Brush your teeth twice a day and floss once a day. Good oral hygiene prevents tooth decay and gum disease.  The frequency of eye exams is based on your age, health, family medical history, use of contact lenses, and other factors. Follow your caregiver's recommendations for frequency of eye exams.  Eat a healthy diet. Foods like vegetables, fruits, whole grains, low-fat dairy products, and lean protein foods contain the nutrients you need without too many calories. Decrease your intake of foods high in solid fats, added sugars, and salt. Eat the right amount of calories for you.Get information about a proper diet from your caregiver, if necessary.  Regular physical exercise is one of the most important things you can do for your health. Most adults should get at least 150 minutes of moderate-intensity exercise (any activity that increases your heart rate and causes you to sweat) each week. In addition, most adults need muscle-strengthening exercises on 2 or more days a week.  Maintain a healthy weight. The body mass index (BMI) is a screening tool to identify possible weight problems. It provides an estimate of body fat based on height and weight. Your caregiver  can help determine your BMI, and can help you achieve or maintain a healthy weight.For adults 20 years and older:  A BMI below 18.5 is considered underweight.  A BMI of 18.5 to 24.9 is normal.  A BMI of 25 to 29.9 is considered overweight.  A BMI of 30 and above is considered obese.  Maintain normal blood lipids and  cholesterol levels by exercising and minimizing your intake of saturated fat. Eat a balanced diet with plenty of fruit and vegetables. Blood tests for lipids and cholesterol should begin at age 77 and be repeated every 5 years. If your lipid or cholesterol levels are high, you are over 50, or you are at high risk for heart disease, you may need your cholesterol levels checked more frequently.Ongoing high lipid and cholesterol levels should be treated with medicines if diet and exercise are not effective.  If you smoke, find out from your caregiver how to quit. If you do not use tobacco, do not start.  If you are pregnant, do not drink alcohol. If you are breastfeeding, be very cautious about drinking alcohol. If you are not pregnant and choose to drink alcohol, do not exceed 1 drink per day. One drink is considered to be 12 ounces (355 mL) of beer, 5 ounces (148 mL) of wine, or 1.5 ounces (44 mL) of liquor.  Avoid use of street drugs. Do not share needles with anyone. Ask for help if you need support or instructions about stopping the use of drugs.  High blood pressure causes heart disease and increases the risk of stroke. Your blood pressure should be checked at least every 1 to 2 years. Ongoing high blood pressure should be treated with medicines if weight loss and exercise are not effective.  If you are 54 to 71 years old, ask your caregiver if you should take aspirin to prevent strokes.  Diabetes screening involves taking a blood sample to check your fasting blood sugar level. This should be done once every 3 years, after age 10, if you are within normal weight and without  risk factors for diabetes. Testing should be considered at a younger age or be carried out more frequently if you are overweight and have at least 1 risk factor for diabetes.  Breast cancer screening is essential preventive care for women. You should practice "breast self-awareness." This means understanding the normal appearance and feel of your breasts and may include breast self-examination. Any changes detected, no matter how small, should be reported to a caregiver. Women in their 69s and 30s should have a clinical breast exam (CBE) by a caregiver as part of a regular health exam every 1 to 3 years. After age 60, women should have a CBE every year. Starting at age 4, women should consider having a mammography (breast X-ray test) every year. Women who have a family history of breast cancer should talk to their caregiver about genetic screening. Women at a high risk of breast cancer should talk to their caregivers about having magnetic resonance imaging (MRI) and a mammography every year.  The Pap test is a screening test for cervical cancer. A Pap test can show cell changes on the cervix that might become cervical cancer if left untreated. A Pap test is a procedure in which cells are obtained and examined from the lower end of the uterus (cervix).  Women should have a Pap test starting at age 68.  Between ages 64 and 81, Pap tests should be repeated every 2 years.  Beginning at age 47, you should have a Pap test every 3 years as long as the past 3 Pap tests have been normal.  Some women have medical problems that increase the chance of getting cervical cancer. Talk to your caregiver about these problems. It is especially important to talk to your caregiver if a new problem develops soon after your last  Pap test. In these cases, your caregiver may recommend more frequent screening and Pap tests.  The above recommendations are the same for women who have or have not gotten the vaccine for human  papillomavirus (HPV).  If you had a hysterectomy for a problem that was not cancer or a condition that could lead to cancer, then you no longer need Pap tests. Even if you no longer need a Pap test, a regular exam is a good idea to make sure no other problems are starting.  If you are between ages 46 and 37, and you have had normal Pap tests going back 10 years, you no longer need Pap tests. Even if you no longer need a Pap test, a regular exam is a good idea to make sure no other problems are starting.  If you have had past treatment for cervical cancer or a condition that could lead to cancer, you need Pap tests and screening for cancer for at least 20 years after your treatment.  If Pap tests have been discontinued, risk factors (such as a new sexual partner) need to be reassessed to determine if screening should be resumed.  The HPV test is an additional test that may be used for cervical cancer screening. The HPV test looks for the virus that can cause the cell changes on the cervix. The cells collected during the Pap test can be tested for HPV. The HPV test could be used to screen women aged 63 years and older, and should be used in women of any age who have unclear Pap test results. After the age of 61, women should have HPV testing at the same frequency as a Pap test.  Colorectal cancer can be detected and often prevented. Most routine colorectal cancer screening begins at the age of 65 and continues through age 62. However, your caregiver may recommend screening at an earlier age if you have risk factors for colon cancer. On a yearly basis, your caregiver may provide home test kits to check for hidden blood in the stool. Use of a small camera at the end of a tube, to directly examine the colon (sigmoidoscopy or colonoscopy), can detect the earliest forms of colorectal cancer. Talk to your caregiver about this at age 36, when routine screening begins. Direct examination of the colon should be  repeated every 5 to 10 years through age 49, unless early forms of pre-cancerous polyps or small growths are found.  Hepatitis C blood testing is recommended for all people born from [birth year] through 1965 and any individual with known risks for hepatitis C.  Practice safe sex. Use condoms and avoid high-risk sexual practices to reduce the spread of sexually transmitted infections (STIs). STIs include gonorrhea, chlamydia, syphilis, trichomonas, herpes, HPV, and human immunodeficiency virus (HIV). Herpes, HIV, and HPV are viral illnesses that have no cure. They can result in disability, cancer, and death. Sexually active women aged 21 and younger should be checked for chlamydia. Older women with new or multiple partners should also be tested for chlamydia. Testing for other STIs is recommended if you are sexually active and at increased risk.  Osteoporosis is a disease in which the bones lose minerals and strength with aging. This can result in serious bone fractures. The risk of osteoporosis can be identified using a bone density scan. Women ages 59 and over and women at risk for fractures or osteoporosis should discuss screening with their caregivers. Ask your caregiver whether you should take a calcium supplement or  vitamin D to reduce the rate of osteoporosis.  Menopause can be associated with physical symptoms and risks. Hormone replacement therapy is available to decrease symptoms and risks. You should talk to your caregiver about whether hormone replacement therapy is right for you.  Use sunscreen with sun protection factor (SPF) of 30 or more. Apply sunscreen liberally and repeatedly throughout the day. You should seek shade when your shadow is shorter than you. Protect yourself by wearing long sleeves, pants, a wide-brimmed hat, and sunglasses year round, whenever you are outdoors.  Once a month, do a whole body skin exam, using a mirror to look at the skin on your back. Notify your caregiver of new  moles, moles that have irregular borders, moles that are larger than a pencil eraser, or moles that have changed in shape or color.  Stay current with required immunizations.  Influenza. You need a dose every fall (or winter). The composition of the flu vaccine changes each year, so being vaccinated once is not enough.  Pneumococcal polysaccharide. You need 1 to 2 doses if you smoke cigarettes or if you have certain chronic medical conditions. You need 1 dose at age 5 (or older) if you have never been vaccinated.  Tetanus, diphtheria, pertussis (Tdap, Td). Get 1 dose of Tdap vaccine if you are younger than age 41, are over 71 and have contact with an infant, are a Research scientist (physical sciences), are pregnant, or simply want to be protected from whooping cough. After that, you need a Td booster dose every 10 years. Consult your caregiver if you have not had at least 3 tetanus and diphtheria-containing shots sometime in your life or have a deep or dirty wound.  HPV. You need this vaccine if you are a woman age 63 or younger. The vaccine is given in 3 doses over 6 months.  Measles, mumps, rubella (MMR). You need at least 1 dose of MMR if you were born in 1957 or later. You may also need a second dose.  Meningococcal. If you are age 31 to 81 and a first-year college student living in a residence hall, or have one of several medical conditions, you need to get vaccinated against meningococcal disease. You may also need additional booster doses.  Zoster (shingles). If you are age 7 or older, you should get this vaccine.  Varicella (chickenpox). If you have never had chickenpox or you were vaccinated but received only 1 dose, talk to your caregiver to find out if you need this vaccine.  Hepatitis A. You need this vaccine if you have a specific risk factor for hepatitis A virus infection or you simply wish to be protected from this disease. The vaccine is usually given as 2 doses, 6 to 18 months apart.  Hepatitis  B. You need this vaccine if you have a specific risk factor for hepatitis B virus infection or you simply wish to be protected from this disease. The vaccine is given in 3 doses, usually over 6 months. Preventive Services / Frequency Ages 55 and over  Blood pressure check.** / Every 1 to 2 years.  Lipid and cholesterol check.** / Every 5 years beginning at age 20.  Clinical breast exam.** / Every year after age 67.  Mammogram.** / Every year beginning at age 56 and continuing for as long as you are in good health. Consult with your caregiver.  Pap test.** / Every 3 years starting at age 44 through age 35 or 34 with a 3 consecutive normal Pap tests.  Testing can be stopped between 65 and 70 with 3 consecutive normal Pap tests and no abnormal Pap or HPV tests in the past 10 years.  HPV screening.** / Every 3 years from ages 63 through ages 61 or 74 with a history of 3 consecutive normal Pap tests. Testing can be stopped between 65 and 70 with 3 consecutive normal Pap tests and no abnormal Pap or HPV tests in the past 10 years.  Fecal occult blood test (FOBT) of stool. / Every year beginning at age 29 and continuing until age 55. You may not need to do this test if you get a colonoscopy every 10 years.  Flexible sigmoidoscopy or colonoscopy.** / Every 5 years for a flexible sigmoidoscopy or every 10 years for a colonoscopy beginning at age 16 and continuing until age 65.  Hepatitis C blood test.** / For all people born from [birth year] through 1965 and any individual with known risks for hepatitis C.  Osteoporosis screening.** / A one-time screening for women ages 18 and over and women at risk for fractures or osteoporosis.  Skin self-exam. / Monthly.  Influenza immunization.** / Every year.  Pneumococcal polysaccharide immunization.** / 1 dose at age 66 (or older) if you have never been vaccinated.  Tetanus, diphtheria, pertussis (Tdap, Td) immunization. / A one-time dose of Tdap vaccine if you  are over 65 and have contact with an infant, are a Research scientist (physical sciences), or simply want to be protected from whooping cough. After that, you need a Td booster dose every 10 years.  Varicella immunization.** / Consult your caregiver.  Meningococcal immunization.** / Consult your caregiver.  Hepatitis A immunization.** / Consult your caregiver. 2 doses, 6 to 18 months apart.  Hepatitis B immunization.** / Check with your caregiver. 3 doses, usually over 6 months. ** Family history and personal history of risk and conditions may change your caregiver's recommendations. Document Released: 03/02/2001 Document Revised: 03/29/2011 Document Reviewed: 06/01/2010 [facility] Patient Information 2013 [place], [place].      Neta Mends. Panosh M.D.

## 2011-12-16 LAB — VITAMIN D 25 HYDROXY (VIT D DEFICIENCY, FRACTURES): Vit D, 25-Hydroxy: 51 ng/mL (ref 30–89)

## 2012-01-26 ENCOUNTER — Other Ambulatory Visit: Payer: Self-pay | Admitting: Internal Medicine

## 2012-01-27 ENCOUNTER — Other Ambulatory Visit: Payer: Self-pay | Admitting: Internal Medicine

## 2012-01-27 MED ORDER — SERTRALINE HCL 50 MG PO TABS: 50.0000 mg | ORAL_TABLET | Freq: Every day | ORAL | Status: DC

## 2012-03-14 ENCOUNTER — Other Ambulatory Visit: Payer: Self-pay | Admitting: Internal Medicine

## 2012-04-05 ENCOUNTER — Other Ambulatory Visit: Payer: Self-pay | Admitting: Family Medicine

## 2012-04-05 MED ORDER — SERTRALINE HCL 50 MG PO TABS: 50.0000 mg | ORAL_TABLET | Freq: Every day | ORAL | Status: DC

## 2012-04-05 MED ORDER — CONJ ESTROG-MEDROXYPROGEST ACE 0.3-1.5 MG PO TABS: ORAL_TABLET | ORAL | Status: DC

## 2012-04-05 MED ORDER — FENOFIBRATE 145 MG PO TABS: ORAL_TABLET | ORAL | Status: DC

## 2012-05-08 ENCOUNTER — Telehealth: Payer: Self-pay | Admitting: Internal Medicine

## 2012-05-08 NOTE — Telephone Encounter (Signed)
Patient Information:  Caller Name: Summer Collins  Phone: [phone]  Patient: Summer Collins, Summer Collins  Gender: Female  DOB: 1940-06-17  Age: 72 Years  PCP: Berniece Andreas (Family Practice)  Office Follow Up:  Does the office need to follow up with this patient?: No  Instructions For The Office: N/A   Symptoms  Reason For Call & Symptoms: Pt/Summer Collins calling that Fenofibrate  (for cholesterol)145 mg 1 PO QD  she stopped taking 04/28/12.  She has been on this for a long time.  Pt is having pain in all her joints, legs and had trouble walking.   So she took herself off of this.   Also her right knee is bothering her.  It is swelling and has been hurting for the past week.  Reviewed Health History In EMR: Yes  Reviewed Medications In EMR: Yes  Reviewed Allergies In EMR: Yes  Reviewed Surgeries / Procedures: Yes  Date of Onset of Symptoms: 04/28/2012  Treatments Tried: Watching diet and she took an Ibuprofen x 1 and is helped.  Treatments Tried Worked: No  Guideline(s) Used:  Knee Pain  Disposition Per Guideline:   See Within 3 Days in Office  Reason For Disposition Reached:   Swollen knee joint (no fever or redness)  Advice Given:  Rest Your Knee   for the next couple days. Avoid activities that worsen your pain. Reduce activities that put a lot of strain on the knee joint (e.g., deep knee bends, stair climbing, running).  Pain Medicines:  For pain relief, you can take either acetaminophen, ibuprofen, or naproxen.  Patient Will Follow Care Advice:  YES  Appointment Scheduled:  05/09/2012 15:15:00 Appointment Scheduled Provider:  Berniece Andreas [facility])

## 2012-05-09 ENCOUNTER — Encounter: Payer: Self-pay | Admitting: Internal Medicine

## 2012-05-09 ENCOUNTER — Ambulatory Visit (INDEPENDENT_AMBULATORY_CARE_PROVIDER_SITE_OTHER): Payer: Medicare Other | Admitting: Internal Medicine

## 2012-05-09 VITALS — BP 114/74 | HR 74 | Temp 98.0°F

## 2012-05-09 DIAGNOSIS — M25469 Effusion, unspecified knee: Secondary | ICD-10-CM | POA: Diagnosis not present

## 2012-05-09 DIAGNOSIS — T887XXA Unspecified adverse effect of drug or medicament, initial encounter: Secondary | ICD-10-CM | POA: Diagnosis not present

## 2012-05-09 DIAGNOSIS — Z79890 Hormone replacement therapy (postmenopausal): Secondary | ICD-10-CM

## 2012-05-09 DIAGNOSIS — M25461 Effusion, right knee: Secondary | ICD-10-CM

## 2012-05-09 DIAGNOSIS — T50905A Adverse effect of unspecified drugs, medicaments and biological substances, initial encounter: Secondary | ICD-10-CM | POA: Insufficient documentation

## 2012-05-09 MED ORDER — CONJ ESTROG-MEDROXYPROGEST ACE 0.3-1.5 MG PO TABS: 1.0000 | ORAL_TABLET | Freq: Every day | ORAL | Status: DC

## 2012-05-09 NOTE — Progress Notes (Signed)
Chief Complaint  Patient presents with  . Joint Swelling    Rt knee.  Started on Saturday.  Painful when she is on her leg for extended periods.    HPI: Patient comes in today for SDA for  new problem evaluation. Onset 2 days ago . No fall sore knee    No  Knee surgery  No new sx   No increase   nocturnal and  ibu at times  .    Feels full in back and sometimes like giving away but no falling lockng or injury  Dr Luiz Blare is husband ortho ROS: See pertinent positives and negatives per HPI. Low dose prempro     For now and asking refill  for 90 days as was only given 2 months she is trying to take last up to 5 days a week or less new symptoms with this  Stopped the TriCor as she had severe body aches with this and got better when she stopped it. Past Medical History  Diagnosis Date  . Hyperlipidemia   . Anxiety      on low dose zoloft for a while  . Vitamin D deficiency   . Osteopenia     Family History  Problem Relation Age of Onset  . Cancer Father     prostate and spread    History   Social History  . Marital Status: Married    Spouse Name: N/A    Number of Children: N/A  . Years of Education: N/A   Social History Main Topics  . Smoking status: Former Games developer  . Smokeless tobacco: None  . Alcohol Use: 0.5 oz/week    1 drink(s) per week  . Drug Use: No  . Sexually Active: None   Other Topics Concern  . None   Social History Narrative   Married   hh of 2    2 children and grandchildren   ai   In [place] since 19 68   G4 P2  2 miscar   BS degree Home economics form [employer]   retired  Currently in Airline pilot.             Outpatient Encounter Prescriptions as of 05/09/2012  Medication Sig Dispense Refill  . Calcium Carbonate-Vitamin D (CALCIUM 600 + D PO) Take by mouth. 1 bid         . cholecalciferol (VITAMIN D) 1000 UNITS tablet Take 1,000 Units by mouth daily. 1 daily or bid        . Cyanocobalamin (VITAMIN B 12 PO) Take by mouth. daily       .  estrogen, conjugated,-medroxyprogesterone (PREMPRO) 0.3-1.5 MG per tablet TAKE 1 TABLET EVERY DAY (TAKE 5 DAYS OUT OF THE WEEK)  60 tablet  1  . sertraline (ZOLOFT) 50 MG tablet Take 1 tablet (50 mg total) by mouth daily.  90 tablet  1  . [DISCONTINUED] fenofibrate (TRICOR) 145 MG tablet TAKE 1 TABLET BY MOUTH EVERY DAY  90 tablet  1   No facility-administered encounter medications on file as of 05/09/2012.    EXAM:  BP 114/74  Pulse 74  Temp(Src) 98 F (36.7 C) (Oral)  SpO2 94%  Body mass index is 0.00 kg/(m^2).  GENERAL: vitals reviewed and listed above, alert, oriented, appears well hydrated and in no acute distress  HEENT: atraumatic, conjunctiva  clear, no obvious abnormalities on inspection of external nose and ears  NECK: no obvious masses on inspection palpation  MS: moves all extremities  Mild limp  Right knee with 2+ effusion no armth or redness    rom full neg negative drawer I believe no crepitus or locking .  Some tenderness in the anterior knee also medially.  left knee has mild swelling to minimal with some crepitance PSYCH: pleasant and cooperative, no obvious depression or anxiety  ASSESSMENT AND PLAN:  Discussed the following assessment and plan:  Knee swelling, right - acute onset  no hx of arthritis ? if internal derangement vs injury  see ortho she will contact dr Luiz Blare off for appt.   Hormone replacement therapy (postmenopausal) - weaning to lowest dose poss  ok to refill  for now.   Adverse drug effect, initial encounter - tricor myalgias ok to stay off    Expectant management.  -Patient advised to return or notify health care team  if symptoms worsen or persist or new concerns arise.  Patient Instructions  This is not an infection  But knee swelling   Although no injury concern about internal derangement such as cartilage etc.    Arthritis can also cause this but I agree that this came on suddenly  And should be evaluated by orthopedics .  In the  meantime  Antiinflammatory  meds for 7- 14 days maximum.  Take 1-2 Aleve  Twice a day  .   Stop if any stomach side effects .      Summer Collins. Summer Collins M.D.

## 2012-05-09 NOTE — Patient Instructions (Signed)
This is not an infection  But knee swelling   Although no injury concern about internal derangement such as cartilage etc.    Arthritis can also cause this but I agree that this came on suddenly  And should be evaluated by orthopedics .  In the meantime  Antiinflammatory  meds for 7- 14 days maximum.  Take 1-2 Aleve  Twice a day  .   Stop if any stomach side effects .

## 2012-05-09 NOTE — Addendum Note (Signed)
Addended by: Raj Janus T on: 05/09/2012 04:40 PM   Modules accepted: Orders

## 2012-05-10 DIAGNOSIS — M1710 Unilateral primary osteoarthritis, unspecified knee: Secondary | ICD-10-CM | POA: Diagnosis not present

## 2012-05-10 DIAGNOSIS — M12269 Villonodular synovitis (pigmented), unspecified knee: Secondary | ICD-10-CM | POA: Diagnosis not present

## 2012-05-24 DIAGNOSIS — M1710 Unilateral primary osteoarthritis, unspecified knee: Secondary | ICD-10-CM | POA: Diagnosis not present

## 2012-07-24 DIAGNOSIS — Z85828 Personal history of other malignant neoplasm of skin: Secondary | ICD-10-CM | POA: Diagnosis not present

## 2012-07-24 DIAGNOSIS — L570 Actinic keratosis: Secondary | ICD-10-CM | POA: Diagnosis not present

## 2012-07-24 DIAGNOSIS — D485 Neoplasm of uncertain behavior of skin: Secondary | ICD-10-CM | POA: Diagnosis not present

## 2012-07-24 DIAGNOSIS — L821 Other seborrheic keratosis: Secondary | ICD-10-CM | POA: Diagnosis not present

## 2012-07-24 DIAGNOSIS — D235 Other benign neoplasm of skin of trunk: Secondary | ICD-10-CM | POA: Diagnosis not present

## 2012-08-01 ENCOUNTER — Other Ambulatory Visit: Payer: Self-pay | Admitting: Internal Medicine

## 2012-09-07 DIAGNOSIS — S7000XA Contusion of unspecified hip, initial encounter: Secondary | ICD-10-CM | POA: Diagnosis not present

## 2012-09-07 DIAGNOSIS — S8000XA Contusion of unspecified knee, initial encounter: Secondary | ICD-10-CM | POA: Diagnosis not present

## 2012-09-13 ENCOUNTER — Encounter: Payer: Self-pay | Admitting: Internal Medicine

## 2012-09-13 DIAGNOSIS — Z1231 Encounter for screening mammogram for malignant neoplasm of breast: Secondary | ICD-10-CM | POA: Diagnosis not present

## 2012-09-13 LAB — HM MAMMOGRAPHY

## 2012-10-10 DIAGNOSIS — H43819 Vitreous degeneration, unspecified eye: Secondary | ICD-10-CM | POA: Diagnosis not present

## 2012-10-10 DIAGNOSIS — H2510 Age-related nuclear cataract, unspecified eye: Secondary | ICD-10-CM | POA: Diagnosis not present

## 2012-10-10 DIAGNOSIS — H01009 Unspecified blepharitis unspecified eye, unspecified eyelid: Secondary | ICD-10-CM | POA: Diagnosis not present

## 2012-10-10 DIAGNOSIS — H04129 Dry eye syndrome of unspecified lacrimal gland: Secondary | ICD-10-CM | POA: Diagnosis not present

## 2012-10-23 DIAGNOSIS — Z23 Encounter for immunization: Secondary | ICD-10-CM | POA: Diagnosis not present

## 2012-11-09 DIAGNOSIS — L719 Rosacea, unspecified: Secondary | ICD-10-CM | POA: Diagnosis not present

## 2012-11-09 DIAGNOSIS — H0019 Chalazion unspecified eye, unspecified eyelid: Secondary | ICD-10-CM | POA: Diagnosis not present

## 2012-12-18 ENCOUNTER — Ambulatory Visit (INDEPENDENT_AMBULATORY_CARE_PROVIDER_SITE_OTHER): Payer: Medicare Other | Admitting: Internal Medicine

## 2012-12-18 ENCOUNTER — Encounter: Payer: Self-pay | Admitting: Internal Medicine

## 2012-12-18 VITALS — BP 130/80 | HR 60 | Temp 98.1°F | Ht 65.5 in | Wt 158.0 lb

## 2012-12-18 DIAGNOSIS — Z0110 Encounter for examination of ears and hearing without abnormal findings: Secondary | ICD-10-CM

## 2012-12-18 DIAGNOSIS — Z23 Encounter for immunization: Secondary | ICD-10-CM

## 2012-12-18 DIAGNOSIS — F419 Anxiety disorder, unspecified: Secondary | ICD-10-CM

## 2012-12-18 DIAGNOSIS — R945 Abnormal results of liver function studies: Secondary | ICD-10-CM

## 2012-12-18 DIAGNOSIS — E785 Hyperlipidemia, unspecified: Secondary | ICD-10-CM

## 2012-12-18 DIAGNOSIS — Z0000 Encounter for general adult medical examination without abnormal findings: Secondary | ICD-10-CM

## 2012-12-18 DIAGNOSIS — F411 Generalized anxiety disorder: Secondary | ICD-10-CM | POA: Diagnosis not present

## 2012-12-18 DIAGNOSIS — Z79890 Hormone replacement therapy (postmenopausal): Secondary | ICD-10-CM | POA: Diagnosis not present

## 2012-12-18 LAB — BASIC METABOLIC PANEL
BUN: 13 mg/dL (ref 6–23)
Chloride: 106 mEq/L (ref 96–112)
Creatinine, Ser: 0.8 mg/dL (ref 0.4–1.2)
GFR: 77.1 mL/min (ref >60.00)
Potassium: 4.2 mEq/L (ref 3.5–5.1)

## 2012-12-18 LAB — LIPID PANEL
Cholesterol: 197 mg/dL (ref 0–200)
LDL Cholesterol: 128 mg/dL — ABNORMAL HIGH (ref 0–99)
Total CHOL/HDL Ratio: 5

## 2012-12-18 LAB — CBC WITH DIFFERENTIAL/PLATELET
Basophils Relative: 0.9 % (ref 0.0–3.0)
Eosinophils Relative: 2.4 % (ref 0.0–5.0)
HCT: 39.8 % (ref 36.0–46.0)
MCV: 92.3 fl (ref 78.0–100.0)
Monocytes Absolute: 0.5 10*3/uL (ref 0.1–1.0)
Monocytes Relative: 8.9 % (ref 3.0–12.0)
Neutrophils Relative %: 55.6 % (ref 43.0–77.0)
Platelets: 267 10*3/uL (ref 150.0–400.0)
RBC: 4.31 Mil/uL (ref 3.87–5.11)
WBC: 5.8 10*3/uL (ref 4.5–10.5)

## 2012-12-18 LAB — HEPATIC FUNCTION PANEL
ALT: 21 U/L (ref 0–35)
AST: 18 U/L (ref 0–37)
Bilirubin, Direct: 0 mg/dL (ref 0.0–0.3)
Total Bilirubin: 0.4 mg/dL (ref 0.3–1.2)
Total Protein: 7 g/dL (ref 6.0–8.3)

## 2012-12-18 NOTE — Patient Instructions (Signed)
Try to decrease the hormone days  When you are on the higher dose of the sertraline and wean as possible.  Will notify you  of labs when available.  ROV yearly or as  Indicated.

## 2012-12-18 NOTE — Progress Notes (Signed)
Chief Complaint  Patient presents with  . Medicare Wellness    HPI: Patient comes in today for Preventive Medicare wellness visit . No major injuries, ed visits ,hospitalizations , new medications since last visit.   Rosacea in eyes and was given  Pills  Allergy and other drops.   And  to change  Eye doctors.   Shoulder  Cortisone shot  BETTER   zoloft helps .   And now taking  Whole pill.  And weans down 25 mg   HOT FLUSHES helped by hrt  Taking 6 days per week  Helps her mood also  Hearing: ok  Vision:  No limitations at present . Last eye check UTD  Safety:  Has smoke detector and wears seat belts.  No firearms. No excess sun exposure. Sees dentist regularly.  Falls:  No   Advance directive :  Reviewed  Has one.  Memory: Felt to be good  , no concern from her or her family.  Depression: No anhedonia unusual crying or depressive symptoms  Nutrition: Eats well balanced diet; adequate calcium and vitamin D. No swallowing chewing problems.  Injury: no major injuries in the last six months.  Other healthcare providers:  Reviewed today .  Social:  Lives with spouse married. No pets.   Preventive parameters: up-to-date  Reviewed   ADLS:   There are no problems or need for assistance  driving, feeding, obtaining food, dressing, toileting and bathing, managing money using phone. She is independent.   EXERCISE/ HABITS  Per week going to start  Stairs    No tobacco  ;  etoh glass wine ocass white    ROS:  GEN/ HEENT: No fever, significant weight changes sweats headaches vision problems hearing changes, CV/ PULM; No chest pain shortness of breath cough, syncope,edema  change in exercise tolerance. GI /GU: No adominal pain, vomiting, change in bowel habits. No blood in the stool. No significant GU symptoms. SKIN/HEME: ,no acute skin rashes suspicious lesions or bleeding. Except hands forarems  Bleed easily when hit  Like what mom had  No lymphadenopathy, nodules, masses.   NEURO/ PSYCH:  No neurologic signs such as weakness numbness. No  New depression anxiety. IMM/ Allergy: No unusual infections.  Allergy .   REST of 12 system review negative except as per HPI   Past Medical History  Diagnosis Date  . Hyperlipidemia   . Anxiety      on low dose zoloft for a while  . Vitamin D deficiency   . Osteopenia     Family History  Problem Relation Age of Onset  . Cancer Father     prostate and spread    History   Social History  . Marital Status: Married    Spouse Name: N/A    Number of Children: N/A  . Years of Education: N/A   Social History Main Topics  . Smoking status: Former Games developer  . Smokeless tobacco: None  . Alcohol Use: 0.5 oz/week    1 drink(s) per week  . Drug Use: No  . Sexual Activity: None   Other Topics Concern  . 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   retired  Currently in Airline pilot.  Outpatient Encounter Prescriptions as of 12/18/2012  Medication Sig  . Calcium Carbonate-Vitamin D (CALCIUM 600 + D PO) Take by mouth. 1 bid     . Cholecalciferol (VITAMIN D3) 1000 UNITS CAPS Take by mouth.  . Cyanocobalamin (VITAMIN B 12 PO) Take by mouth. daily  . doxycycline (DORYX) 100 MG DR capsule Take 100 mg by mouth daily.  Marland Kitchen estrogen, conjugated,-medroxyprogesterone (PREMPRO) 0.3-1.5 MG per tablet TAKE 1 TABLET DAILY, 6 DAYS OUT OF THE WEEK  . Glucos-MSM-C-Mn-Ginger-Willow (GLUCOSAMINE MSM COMPLEX PO) Take by mouth.  . Omega-3 Fatty Acids (FISH OIL) 1000 MG CAPS Take by mouth.  . sertraline (ZOLOFT) 50 MG tablet Take 1 tablet (50 mg total) by mouth daily.  . [DISCONTINUED] PREMPRO 0.3-1.5 MG per tablet TAKE 1 TABLET DAILY, 5 DAYS OUT OF THE WEEK  . [DISCONTINUED] cholecalciferol (VITAMIN D) 1000 UNITS tablet Take 1,000 Units by mouth daily. 1 daily or bid    .  [DISCONTINUED] estrogen, conjugated,-medroxyprogesterone (PREMPRO) 0.3-1.5 MG per tablet Take 1 tablet by mouth daily.    EXAM:  BP 130/80  Pulse 60  Temp(Src) 98.1 F (36.7 C) (Oral)  Ht 5' 5.5" (1.664 m)  Wt 158 lb (71.668 kg)  BMI 25.88 kg/m2  SpO2 96%  Body mass index is 25.88 kg/(m^2).  Physical Exam: Vital signs reviewed ZOX:WRUE is a well-developed well-nourished alert cooperative   who appears stated age in no acute distress.  HEENT: normocephalic atraumatic , Eyes: PERRL EOM's full, conjunctiva clear, Nares: paten,t no deformity discharge or tenderness., Ears: no deformity EAC's clear TMs with normal landmarks. Mouth: clear OP, no lesions, edema.  Moist mucous membranes. Dentition in adequate repair. NECK: supple without masses, thyromegaly or bruits. CHEST/PULM:  Clear to auscultation and percussion breath sounds equal no wheeze , rales or rhonchi. No chest wall deformities or tenderness. CV: PMI is nondisplaced, S1 S2 no gallops, murmurs, rubs. Peripheral pulses are full without delay.No JVD .  Breast: normal by inspection . No dimpling, discharge, masses, tenderness or discharge . ABDOMEN: Bowel sounds normal nontender  No guard or rebound, no hepato splenomegal no CVA tenderness.  No hernia. Extremtities:  No clubbing cyanosis or edema, no acute joint swelling or redness no focal atrophy NEURO:  Oriented x3, cranial nerves 3-12 appear to be intact, no obvious focal weakness,gait within normal limits no abnormal reflexes or asymmetrical SKIN: No acute rashes normal turgor, color, no  Petechiae. Forearm forsal hands fadking ecchymosis  PSYCH: Oriented, good eye contact, no obvious depression anxiety, cognition and judgment appear normal. LN: no cervical axillary inguinal adenopathy No noted deficits in memory, attention, and speech.   Lab Results  Component Value Date   WBC 5.7 12/15/2011   HGB 13.4 12/15/2011   HCT 40.5 12/15/2011   PLT 319.0 12/15/2011   GLUCOSE 97  12/15/2011   CHOL 161 12/15/2011   TRIG 136.0 12/15/2011   HDL 40.20 12/15/2011   LDLCALC 94 12/15/2011   ALT 37* 12/15/2011   AST 31 12/15/2011   NA 138 12/15/2011   K 4.3 12/15/2011   CL 105 12/15/2011   CREATININE 0.7 12/15/2011   BUN 16 12/15/2011   CO2 27 12/15/2011   TSH 2.48 12/15/2011    ASSESSMENT AND PLAN:  Discussed the following assessment and plan:  Medicare annual wellness visit, subsequent - utd had flu inj prevnar 13 today - Plan: Omega-3 Fatty Acids (FISH OIL) 1000 MG CAPS, Cholecalciferol (VITAMIN D3) 1000 UNITS CAPS, doxycycline (DORYX) 100 MG DR capsule, Glucos-MSM-C-Mn-Ginger-Willow (GLUCOSAMINE MSM COMPLEX  PO), estrogen, conjugated,-medroxyprogesterone (PREMPRO) 0.3-1.5 MG per tablet, Basic metabolic panel, CBC with Differential, Hepatic function panel, Lipid panel, TSH  Hyperlipidemia - Plan: Omega-3 Fatty Acids (FISH OIL) 1000 MG CAPS, Cholecalciferol (VITAMIN D3) 1000 UNITS CAPS, doxycycline (DORYX) 100 MG DR capsule, Glucos-MSM-C-Mn-Ginger-Willow (GLUCOSAMINE MSM COMPLEX PO), estrogen, conjugated,-medroxyprogesterone (PREMPRO) 0.3-1.5 MG per tablet, Basic metabolic panel, CBC with Differential, Hepatic function panel, Lipid panel, TSH  Hormone replacement therapy (postmenopausal) - disc tryuing to wean again  reasons disc - Plan: Omega-3 Fatty Acids (FISH OIL) 1000 MG CAPS, Cholecalciferol (VITAMIN D3) 1000 UNITS CAPS, doxycycline (DORYX) 100 MG DR capsule, Glucos-MSM-C-Mn-Ginger-Willow (GLUCOSAMINE MSM COMPLEX PO), estrogen, conjugated,-medroxyprogesterone (PREMPRO) 0.3-1.5 MG per tablet, Basic metabolic panel, CBC with Differential, Hepatic function panel, Lipid panel, TSH  Nonspecific abnormal results of liver function study - better at last check recheck today had neg [facility]  - Plan: Omega-3 Fatty Acids (FISH OIL) 1000 MG CAPS, Cholecalciferol (VITAMIN D3) 1000 UNITS CAPS, doxycycline (DORYX) 100 MG DR capsule, Glucos-MSM-C-Mn-Ginger-Willow (GLUCOSAMINE MSM  COMPLEX PO), estrogen, conjugated,-medroxyprogesterone (PREMPRO) 0.3-1.5 MG per tablet, Basic metabolic panel, CBC with Differential, Hepatic function panel, Lipid panel, TSH  Need for prophylactic vaccination against Streptococcus pneumoniae (pneumococcus) - Plan: Pneumococcal conjugate vaccine 13-valent  Anxiety - zoloft helps  disc  dosing etc    Patient Care Team: Madelin Headings, MD as PCP - General (Internal Medicine)  Patient Instructions  Try to decrease the hormone days  When you are on the higher dose of the sertraline and wean as possible.  Will notify you  of labs when available.  ROV yearly or as  Indicated.     Neta Mends. Maeven Mcdougall M.D.    Health Maintenance  Topic Date Due  . Influenza Vaccine  08/18/2013  . Mammogram  09/13/2013  . Colonoscopy  09/18/2017  . Tetanus/tdap  07/26/2019  . Pneumococcal Polysaccharide Vaccine Age 40 And Over  Completed  . Zostavax  Completed   Health Maintenance Review }

## 2013-02-13 ENCOUNTER — Other Ambulatory Visit: Payer: Self-pay | Admitting: Family Medicine

## 2013-02-13 MED ORDER — SERTRALINE HCL 50 MG PO TABS: 50.0000 mg | ORAL_TABLET | Freq: Every day | ORAL | Status: DC

## 2013-05-09 ENCOUNTER — Other Ambulatory Visit: Payer: Self-pay | Admitting: Internal Medicine

## 2013-05-09 NOTE — Telephone Encounter (Signed)
Request is for 2 tablets.  Please advise.  Thanks!

## 2013-05-09 NOTE — Telephone Encounter (Signed)
i think it should be 2 monthly packs ? 60 tabs?

## 2013-07-29 DIAGNOSIS — J00 Acute nasopharyngitis [common cold]: Secondary | ICD-10-CM | POA: Diagnosis not present

## 2013-09-13 ENCOUNTER — Other Ambulatory Visit: Payer: Self-pay | Admitting: Internal Medicine

## 2013-09-13 NOTE — Telephone Encounter (Signed)
Sent to the pharmacy by e-scribe. 

## 2013-09-18 ENCOUNTER — Other Ambulatory Visit: Payer: Self-pay | Admitting: Internal Medicine

## 2013-09-18 DIAGNOSIS — B351 Tinea unguium: Secondary | ICD-10-CM | POA: Diagnosis not present

## 2013-09-18 DIAGNOSIS — L821 Other seborrheic keratosis: Secondary | ICD-10-CM | POA: Diagnosis not present

## 2013-09-18 DIAGNOSIS — L819 Disorder of pigmentation, unspecified: Secondary | ICD-10-CM | POA: Diagnosis not present

## 2013-09-18 DIAGNOSIS — L820 Inflamed seborrheic keratosis: Secondary | ICD-10-CM | POA: Diagnosis not present

## 2013-09-18 DIAGNOSIS — B353 Tinea pedis: Secondary | ICD-10-CM | POA: Diagnosis not present

## 2013-09-18 DIAGNOSIS — Z85828 Personal history of other malignant neoplasm of skin: Secondary | ICD-10-CM | POA: Diagnosis not present

## 2013-09-18 NOTE — Telephone Encounter (Signed)
Refilled for 3 months and send a letter as a reminder to schedule her wellness exam in Dec. 2015.

## 2013-09-25 DIAGNOSIS — Z1231 Encounter for screening mammogram for malignant neoplasm of breast: Secondary | ICD-10-CM | POA: Diagnosis not present

## 2013-09-25 LAB — HM MAMMOGRAPHY

## 2013-09-28 ENCOUNTER — Encounter: Payer: Self-pay | Admitting: Internal Medicine

## 2013-10-10 ENCOUNTER — Encounter: Payer: Self-pay | Admitting: Internal Medicine

## 2013-11-15 ENCOUNTER — Other Ambulatory Visit: Payer: Self-pay | Admitting: Internal Medicine

## 2013-11-15 NOTE — Telephone Encounter (Signed)
Sent to the pharmacy by e-scribe.  She will be due for her annual wellness in Dec.  I will send a reminder by mail for her to make that appt.

## 2013-11-19 ENCOUNTER — Encounter: Payer: Self-pay | Admitting: Internal Medicine

## 2013-11-29 ENCOUNTER — Other Ambulatory Visit: Payer: Self-pay | Admitting: Internal Medicine

## 2013-11-29 ENCOUNTER — Telehealth: Payer: Self-pay | Admitting: Internal Medicine

## 2013-11-29 ENCOUNTER — Telehealth: Payer: Self-pay | Admitting: Family Medicine

## 2013-11-29 NOTE — Telephone Encounter (Signed)
This patient is due for her Medicare Wellness exam in December.  Please help to get on the schedule for a fasting appointment.  Thanks!

## 2013-11-29 NOTE — Telephone Encounter (Signed)
Sent to the pharmacy for 90 days.  Pt is due for her medicare wellness exam in December.  Will send a message to scheduling to help her get on the schedule.

## 2013-11-29 NOTE — Telephone Encounter (Signed)
Pt has been sch

## 2013-11-29 NOTE — Telephone Encounter (Signed)
Pt is a little dizzy and off balance and would like to come in tomorrow. Can I use sda?

## 2013-11-29 NOTE — Telephone Encounter (Signed)
I looked in the chart.  Appears to be a new problem.  Please make 30 minute appointment.  Thanks!

## 2013-11-30 ENCOUNTER — Ambulatory Visit (INDEPENDENT_AMBULATORY_CARE_PROVIDER_SITE_OTHER): Payer: Medicare Other | Admitting: Internal Medicine

## 2013-11-30 ENCOUNTER — Encounter: Payer: Self-pay | Admitting: Internal Medicine

## 2013-11-30 VITALS — BP 136/80 | Temp 98.1°F | Ht 65.5 in

## 2013-11-30 DIAGNOSIS — Z23 Encounter for immunization: Secondary | ICD-10-CM | POA: Diagnosis not present

## 2013-11-30 DIAGNOSIS — R42 Dizziness and giddiness: Secondary | ICD-10-CM | POA: Diagnosis not present

## 2013-11-30 NOTE — Patient Instructions (Addendum)
Your exam is good today   This acts like vertigo positional .   Reassuring that your exam is normal today .   Plan  Wellness  Exam  In January . Or December if available   Dizziness Dizziness is a common problem. It is a feeling of unsteadiness or light-headedness. You may feel like you are about to faint. Dizziness can lead to injury if you stumble or fall. A person of any age group can suffer from dizziness, but dizziness is more common in older adults. CAUSES  Dizziness can be caused by many different things, including:  Middle ear problems.  Standing for too long.  Infections.  An allergic reaction.  Aging.  An emotional response to something, such as the sight of blood.  Side effects of medicines.  Tiredness.  Problems with circulation or blood pressure.  Excessive use of alcohol or medicines, or illegal drug use.  Breathing too fast (hyperventilation).  An irregular heart rhythm (arrhythmia).  A low red blood cell count (anemia).  Pregnancy.  Vomiting, diarrhea, fever, or other illnesses that cause body fluid loss (dehydration).  Diseases or conditions such as Parkinson's disease, high blood pressure (hypertension), diabetes, and thyroid problems.  Exposure to extreme heat. DIAGNOSIS  Your health care provider will ask about your symptoms, perform a physical exam, and perform an electrocardiogram (ECG) to record the electrical activity of your heart. Your health care provider may also perform other heart or blood tests to determine the cause of your dizziness. These may include:  Transthoracic echocardiogram (TTE). During echocardiography, sound waves are used to evaluate how blood flows through your heart.  Transesophageal echocardiogram (TEE).  Cardiac monitoring. This allows your health care provider to monitor your heart rate and rhythm in real time.  Holter monitor. This is a portable device that records your heartbeat and can help diagnose heart  arrhythmias. It allows your health care provider to track your heart activity for several days if needed.  Stress tests by exercise or by giving medicine that makes the heart beat faster. TREATMENT  Treatment of dizziness depends on the cause of your symptoms and can vary greatly. HOME CARE INSTRUCTIONS   Drink enough fluids to keep your urine clear or pale yellow. This is especially important in very hot weather. In older adults, it is also important in cold weather.  Take your medicine exactly as directed if your dizziness is caused by medicines. When taking blood pressure medicines, it is especially important to get up slowly.  Rise slowly from chairs and steady yourself until you feel okay.  In the morning, first sit up on the side of the bed. When you feel okay, stand slowly while holding onto something until you know your balance is fine.  Move your legs often if you need to stand in one place for a long time. Tighten and relax your muscles in your legs while standing.  Have someone stay with you for 1-2 days if dizziness continues to be a problem. Do this until you feel you are well enough to stay alone. Have the person call your health care provider if he or she notices changes in you that are concerning.  Do not drive or use heavy machinery if you feel dizzy.  Do not drink alcohol. SEEK IMMEDIATE MEDICAL CARE IF:   Your dizziness or light-headedness gets worse.  You feel nauseous or vomit.  You have problems talking, walking, or using your arms, hands, or legs.  You feel weak.  You  are not thinking clearly or you have trouble forming sentences. It may take a friend or family member to notice this.  You have chest pain, abdominal pain, shortness of breath, or sweating.  Your vision changes.  You notice any bleeding.  You have side effects from medicine that seems to be getting worse rather than better. MAKE SURE YOU:   Understand these instructions.  Will watch  your condition.  Will get help right away if you are not doing well or get worse. Document Released: 06/30/2000 Document Revised: 01/09/2013 Document Reviewed: 07/24/2010 [facility] Patient Information 2015 [place], [place]. This information is not intended to replace advice given to you by your health care provider. Make sure you discuss any questions you have with your health care provider.  BP Readings from Last 3 Encounters:  11/30/13 150/80  12/18/12 130/80  05/09/12 114/74

## 2013-11-30 NOTE — Progress Notes (Signed)
Pre visit review using our clinic review tool, if applicable. No additional management support is needed unless otherwise documented below in the visit note.  Chief Complaint  Patient presents with  . Dizziness    Started on Tuesday.  No dizziness today.    HPI: Patient Summer Collins  comes in today for SDA for  new problem evaluation. Last ov was 12 14 for wellnesshas been doing well since that however this week having problems with dizziness  Mon 4 days ago noted some minimal dizziness and then Tuesday when woke up and sat up and felt dizzy  And had to hold on to function initially  And tueas  Am  2 days ago sat up and was much worse felt nauseous couldn't keep her balance but had no falling visual changes hearing changes felt like she was going to get a headache and nausea but didn't laid on thereclinerwith an afghan and then felt better. Yesterday he was able to do many functions went to an event last night and has no residual symptoms today. Tender righ ear tenderness. Fever chest pain shortness of breath ROS: See pertinent positives and negatives per HPI. Saline washes for sinuses Has shoulder pain on left To see ortho.  Dr. Eddie Dibbles next week No change in medicines injuries are other wise change in health. Has never had this problem before no associated numbness weakness.  Past Medical History  Diagnosis Date  . Hyperlipidemia   . Anxiety      on low dose zoloft for a while  . Vitamin D deficiency   . Osteopenia     Family History  Problem Relation Age of Onset  . Cancer Father     prostate and spread    History   Social History  . Marital Status: Married    Spouse Name: N/A    Number of Children: N/A  . Years of Education: N/A   Social History Main Topics  . Smoking status: Former Research scientist (life sciences)  . Smokeless tobacco: None  . Alcohol Use: 0.5 oz/week    1 drink(s) per week  . Drug Use: No  . Sexual Activity: None   Other Topics Concern  . 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   retired  Currently in Press photographer.                Outpatient Encounter Prescriptions as of 11/30/2013  Medication Sig  . Calcium Carbonate-Vitamin D (CALCIUM 600 + D PO) Take by mouth. 1 bid     . Cholecalciferol (VITAMIN D3) 1000 UNITS CAPS Take by mouth.  . Cyanocobalamin (VITAMIN B 12 PO) Take by mouth. 2544mcg daily  . estrogen, conjugated,-medroxyprogesterone (PREMPRO) 0.3-1.5 MG per tablet TAKE 1 TABLET DAILY, 6 DAYS OUT OF THE WEEK  . Glucos-MSM-C-Mn-Ginger-Willow (GLUCOSAMINE MSM COMPLEX PO) Take by mouth.  . Omega-3 Fatty Acids (FISH OIL) 1000 MG CAPS Take by mouth.  . sertraline (ZOLOFT) 50 MG tablet TAKE 1 TABLET DAILY  . [DISCONTINUED] doxycycline (DORYX) 100 MG DR capsule Take 100 mg by mouth daily.  . [DISCONTINUED] PREMPRO 0.3-1.5 MG per tablet TAKE 1 TABLET DAILY FIVE DAYS OUT OF THE WEEK    EXAM:  BP 136/80 mmHg  Temp(Src) 98.1 F (36.7 C) (  Oral)  Ht 5' 5.5" (1.664 m)  Wt   Body mass index is 0.00 kg/(m^2).  GENERAL: vitals reviewed and listed above, alert, oriented, appears well hydrated and in no acute distress HEENT: atraumatic, conjunctiva  clear, EOMs are fullno obvious abnormalities on inspection of external nose and ears OP : no lesion edema or exudate tongue is midline NECK: no obvious masses on inspection palpation no bruit is heard LUNGS: clear to auscultation bilaterally, no wheezes, rales or rhonchi, good air movement CV: HRRR, no clubbing cyanosis or  peripheral edema nl cap refill  Abdomen soft without organomegaly guarding or rebound MS: moves all extremities without noticeable focal  abnormality PSYCH: pleasant and cooperative, no obvious depression or anxiety NEURO: oriented x 3 CN 3-12 appear intact. No focal muscle weakness or atrophy. DTRs symmetrical. Gait WNL.  Grossly non focal.  No tremor or abnormal movement. Negative Romberg finger to nose normal heel to toe normal  ASSESSMENT AND PLAN:  Discussed the following assessment and plan:  Need for prophylactic vaccination and inoculation against influenza - Plan: Flu vaccine HIGH DOSE PF (Fluzone Tri High dose)  Postural dizziness - prob vertiog vestibular  nl exam today  Need for prophylactic vaccination and inoculation against cholera alone Fu if recurring  Reassuring exam today due for wellens lab monitoring etc    Expectant management.  -Patient advised to return or notify health care team  if symptoms worsen ,persist or new concerns arise.  Patient Instructions   Your exam is good today   This acts like vertigo positional .   Reassuring that your exam is normal today .   Plan  Wellness  Exam  In January . Or December if available   Dizziness Dizziness is a common problem. It is a feeling of unsteadiness or light-headedness. You may feel like you are about to faint. Dizziness can lead to injury if you stumble or fall. A person of any age group can suffer from dizziness, but dizziness is more common in older adults. CAUSES  Dizziness can be caused by many different things, including:  Middle ear problems.  Standing for too long.  Infections.  An allergic reaction.  Aging.  An emotional response to something, such as the sight of blood.  Side effects of medicines.  Tiredness.  Problems with circulation or blood pressure.  Excessive use of alcohol or medicines, or illegal drug use.  Breathing too fast (hyperventilation).  An irregular heart rhythm (arrhythmia).  A low red blood cell count (anemia).  Pregnancy.  Vomiting, diarrhea, fever, or other illnesses that cause body fluid loss (dehydration).  Diseases or conditions such as Parkinson's disease, high blood pressure (hypertension), diabetes, and thyroid problems.  Exposure to extreme heat. DIAGNOSIS  Your health care provider  will ask about your symptoms, perform a physical exam, and perform an electrocardiogram (ECG) to record the electrical activity of your heart. Your health care provider may also perform other heart or blood tests to determine the cause of your dizziness. These may include:  Transthoracic echocardiogram (TTE). During echocardiography, sound waves are used to evaluate how blood flows through your heart.  Transesophageal echocardiogram (TEE).  Cardiac monitoring. This allows your health care provider to monitor your heart rate and rhythm in real time.  Holter monitor. This is a portable device that records your heartbeat and can help diagnose heart arrhythmias. It allows your health care provider to track your heart activity for several days if needed.  Stress tests by exercise or by giving  medicine that makes the heart beat faster. TREATMENT  Treatment of dizziness depends on the cause of your symptoms and can vary greatly. HOME CARE INSTRUCTIONS   Drink enough fluids to keep your urine clear or pale yellow. This is especially important in very hot weather. In older adults, it is also important in cold weather.  Take your medicine exactly as directed if your dizziness is caused by medicines. When taking blood pressure medicines, it is especially important to get up slowly.  Rise slowly from chairs and steady yourself until you feel okay.  In the morning, first sit up on the side of the bed. When you feel okay, stand slowly while holding onto something until you know your balance is fine.  Move your legs often if you need to stand in one place for a long time. Tighten and relax your muscles in your legs while standing.  Have someone stay with you for 1-2 days if dizziness continues to be a problem. Do this until you feel you are well enough to stay alone. Have the person call your health care provider if he or she notices changes in you that are concerning.  Do not drive or use heavy  machinery if you feel dizzy.  Do not drink alcohol. SEEK IMMEDIATE MEDICAL CARE IF:   Your dizziness or light-headedness gets worse.  You feel nauseous or vomit.  You have problems talking, walking, or using your arms, hands, or legs.  You feel weak.  You are not thinking clearly or you have trouble forming sentences. It may take a friend or family member to notice this.  You have chest pain, abdominal pain, shortness of breath, or sweating.  Your vision changes.  You notice any bleeding.  You have side effects from medicine that seems to be getting worse rather than better. MAKE SURE YOU:   Understand these instructions.  Will watch your condition.  Will get help right away if you are not doing well or get worse. Document Released: 06/30/2000 Document Revised: 01/09/2013 Document Reviewed: 07/24/2010 [facility] Patient Information 2015 [place], [place]. This information is not intended to replace advice given to you by your health care provider. Make sure you discuss any questions you have with your health care provider.  BP Readings from Last 3 Encounters:  11/30/13 150/80  12/18/12 130/80  05/09/12 114/74        Wanda K. Panosh M.D.  Total visit 75mins > 50% spent counseling and coordinating care

## 2013-12-03 DIAGNOSIS — M7542 Impingement syndrome of left shoulder: Secondary | ICD-10-CM | POA: Diagnosis not present

## 2013-12-07 NOTE — Telephone Encounter (Signed)
Pt has been sch

## 2013-12-11 DIAGNOSIS — J069 Acute upper respiratory infection, unspecified: Secondary | ICD-10-CM | POA: Diagnosis not present

## 2013-12-17 DIAGNOSIS — M75112 Incomplete rotator cuff tear or rupture of left shoulder, not specified as traumatic: Secondary | ICD-10-CM | POA: Diagnosis not present

## 2014-01-17 ENCOUNTER — Other Ambulatory Visit: Payer: Self-pay | Admitting: Internal Medicine

## 2014-01-17 NOTE — Telephone Encounter (Signed)
Will see if Dr. Regis Bill is filling.  No past refills in the system.

## 2014-01-17 NOTE — Telephone Encounter (Signed)
Can refill x 1 month   [facility] appt in   January

## 2014-01-17 NOTE — Telephone Encounter (Signed)
Sent to the pharmacy by e-scribe. 

## 2014-01-28 DIAGNOSIS — M7542 Impingement syndrome of left shoulder: Secondary | ICD-10-CM | POA: Diagnosis not present

## 2014-02-04 ENCOUNTER — Ambulatory Visit (INDEPENDENT_AMBULATORY_CARE_PROVIDER_SITE_OTHER): Payer: Medicare Other | Admitting: Internal Medicine

## 2014-02-04 ENCOUNTER — Encounter: Payer: Self-pay | Admitting: Internal Medicine

## 2014-02-04 VITALS — BP 124/80 | Temp 97.9°F | Ht 65.0 in | Wt 157.0 lb

## 2014-02-04 DIAGNOSIS — Z79890 Hormone replacement therapy (postmenopausal): Secondary | ICD-10-CM

## 2014-02-04 DIAGNOSIS — R0981 Nasal congestion: Secondary | ICD-10-CM

## 2014-02-04 DIAGNOSIS — Z0000 Encounter for general adult medical examination without abnormal findings: Secondary | ICD-10-CM

## 2014-02-04 DIAGNOSIS — Z79899 Other long term (current) drug therapy: Secondary | ICD-10-CM

## 2014-02-04 DIAGNOSIS — E785 Hyperlipidemia, unspecified: Secondary | ICD-10-CM | POA: Diagnosis not present

## 2014-02-04 DIAGNOSIS — E2839 Other primary ovarian failure: Secondary | ICD-10-CM | POA: Diagnosis not present

## 2014-02-04 LAB — BASIC METABOLIC PANEL
BUN: 16 mg/dL (ref 6–23)
CHLORIDE: 109 meq/L (ref 96–112)
CO2: 28 meq/L (ref 19–32)
Calcium: 9.5 mg/dL (ref 8.4–10.5)
Creatinine, Ser: 0.68 mg/dL (ref 0.40–1.20)
GFR: 90.04 mL/min (ref >60.00)
Glucose, Bld: 104 mg/dL — ABNORMAL HIGH (ref 70–99)
Potassium: 4.9 mEq/L (ref 3.5–5.1)
Sodium: 143 mEq/L (ref 135–145)

## 2014-02-04 LAB — CBC WITH DIFFERENTIAL/PLATELET
Basophils Absolute: 0.1 10*3/uL (ref 0.0–0.1)
Basophils Relative: 1.2 % (ref 0.0–3.0)
Eosinophils Absolute: 0.2 10*3/uL (ref 0.0–0.7)
Eosinophils Relative: 3 % (ref 0.0–5.0)
HCT: 40.1 % (ref 36.0–46.0)
HEMOGLOBIN: 13.3 g/dL (ref 12.0–15.0)
LYMPHS ABS: 1.9 10*3/uL (ref 0.7–4.0)
Lymphocytes Relative: 30.6 % (ref 12.0–46.0)
MCHC: 33.1 g/dL (ref 30.0–36.0)
MCV: 92.1 fl (ref 78.0–100.0)
Monocytes Absolute: 0.6 10*3/uL (ref 0.1–1.0)
Monocytes Relative: 9.3 % (ref 3.0–12.0)
Neutro Abs: 3.4 10*3/uL (ref 1.4–7.7)
Neutrophils Relative %: 55.9 % (ref 43.0–77.0)
PLATELETS: 285 10*3/uL (ref 150.0–400.0)
RBC: 4.35 Mil/uL (ref 3.87–5.11)
RDW: 13.3 % (ref 11.5–15.5)
WBC: 6.1 10*3/uL (ref 4.0–10.5)

## 2014-02-04 LAB — LIPID PANEL
Cholesterol: 204 mg/dL — ABNORMAL HIGH (ref 0–200)
HDL: 39.8 mg/dL (ref >39.00)
LDL CALC: 126 mg/dL — AB (ref 0–99)
NonHDL: 164.2
Total CHOL/HDL Ratio: 5
Triglycerides: 191 mg/dL — ABNORMAL HIGH (ref 0.0–149.0)
VLDL: 38.2 mg/dL (ref 0.0–40.0)

## 2014-02-04 LAB — HEPATIC FUNCTION PANEL
ALBUMIN: 3.9 g/dL (ref 3.5–5.2)
ALT: 15 U/L (ref 0–35)
AST: 15 U/L (ref 0–37)
Alkaline Phosphatase: 43 U/L (ref 39–117)
BILIRUBIN DIRECT: 0.1 mg/dL (ref 0.0–0.3)
BILIRUBIN TOTAL: 0.3 mg/dL (ref 0.2–1.2)
Total Protein: 6.9 g/dL (ref 6.0–8.3)

## 2014-02-04 LAB — TSH: TSH: 2.04 u[IU]/mL (ref 0.35–4.50)

## 2014-02-04 NOTE — Progress Notes (Signed)
Pre visit review using our clinic review tool, if applicable. No additional management support is needed unless otherwise documented below in the visit note.  Chief Complaint  Patient presents with  . Medicare Wellness    HPI: Patient comes in today for Preventive Medicare wellness visit .and Chronic disease management   ZOLOFT : for depressionDoubles  Dose at x mas and doing well   Had vertigo in fall and now with minor cough  No cp sob some nasal congestion no fever   prempro every day bu Sunday.  5  Still gest some hot flushes    Had tried other meds in past and got worse  Skin and shoulder  Sees specialists  Dr Eddie Dibbles and derm   Health Maintenance  Topic Date Due  . DEXA SCAN  08/29/2005  . INFLUENZA VACCINE  08/19/2014  . MAMMOGRAM  09/26/2014  . COLONOSCOPY  09/18/2017  . TETANUS/TDAP  07/26/2019  . PNEUMOCOCCAL POLYSACCHARIDE VACCINE AGE 74 AND OVER  Completed  . ZOSTAVAX  Completed   Health Maintenance Review  LIFESTYLE:  Exercise:  Nothing  Tobacco/ETS: no Alcohol: social Sugar beverages: no  To rare juice  Sleep: 7 hours  Drug use: no Bone density:  Colonoscopy:  SEE scanned Document for more hx  Hearing:  Ok   Vision:  No limitations at present . Last eye check due  Safety:  Has smoke detector and wears seat belts.  No firearms. No excess sun exposure. Sees dentist regularly.  Falls: no  Advance directive :  Reviewed .  No  Form given to her    Memory: Felt to be good  , no concern from her or her family.  Depression: No anhedonia unusual crying or depressive symptoms  Nutrition: Eats well balanced diet; adequate calcium and vitamin D. No swallowing chewing problems.  Injury: no major injuries in the last six months.  Other healthcare providers:  Reviewed today .  Social:  Lives with spouse married. No pets.   Preventive parameters: up-to-date  Reviewed   ADLS:   There are no problems or need for assistance  driving, feeding, obtaining food,  dressing, toileting and bathing, managing money using phone. She is independent.    ROS:  GEN/ HEENT: No fever, significant weight changes  headaches vision problems hearing changes, CV/ PULM; No chest pain shortness of breath cough, syncope,edema  change in exercise tolerance. GI /GU: No adominal pain, vomiting, change in bowel habits. No blood in the stool. No significant GU symptoms. SKIN/HEME: ,no acute skin rashes suspicious lesions or bleeding. No lymphadenopathy, nodules, masses.  NEURO/ PSYCH:  No neurologic signs such as weakness numbness. No depression anxiety. IMM/ Allergy: No unusual infections.  Allergy .   REST of 12 system review negative except as per HPI   Past Medical History  Diagnosis Date  . Hyperlipidemia   . Anxiety      on low dose zoloft for a while  . Vitamin D deficiency   . Osteopenia     Family History  Problem Relation Age of Onset  . Cancer Father     prostate and spread    History   Social History  . Marital Status: Married    Spouse Name: N/A    Number of Children: N/A  . Years of Education: N/A   Social History Main Topics  . Smoking status: Former Research scientist (life sciences)  . Smokeless tobacco: None  . Alcohol Use: 0.5 oz/week    1 drink(s) per week  . Drug  Use: No  . Sexual Activity: None   Other Topics Concern  . 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                Outpatient Encounter Prescriptions as of 02/04/2014  Medication Sig  . Calcium Carbonate-Vitamin D (CALCIUM 600 + D PO) Take by mouth. 1 bid     . Cholecalciferol (VITAMIN D3) 1000 UNITS CAPS Take by mouth.  . Cyanocobalamin (VITAMIN B 12 PO) Take by mouth. 2546mcg daily  . estrogen, conjugated,-medroxyprogesterone (PREMPRO) 0.3-1.5 MG per tablet TAKE 1 TABLET DAILY, 6 DAYS OUT OF THE WEEK  .  Glucos-MSM-C-Mn-Ginger-Willow (GLUCOSAMINE MSM COMPLEX PO) Take by mouth.  . Omega-3 Fatty Acids (FISH OIL) 1000 MG CAPS Take by mouth.  . sertraline (ZOLOFT) 50 MG tablet TAKE 1 TABLET DAILY  . [DISCONTINUED] estrogen, conjugated,-medroxyprogesterone (PREMPRO) 0.3-1.5 MG per tablet TAKE 1 TABLET FIVE DAYS OUT OF THE WEEK.  WEAN AS DIRECTED.    EXAM:  BP 124/80 mmHg  Temp(Src) 97.9 F (36.6 C) (Oral)  Ht 5\' 5"  (1.651 m)  Wt 157 lb (71.215 kg)  BMI 26.13 kg/m2  Body mass index is 26.13 kg/(m^2).  Physical Exam: Vital signs reviewed PQZ:RAQT is a well-developed well-nourished alert cooperative   who appears stated age in no acute distress.  HEENT: normocephalic atraumatic , Eyes: PERRL EOM's full, conjunctiva clear, Nares: paten,t no deformity discharge or tenderness., Ears: no deformity EAC's clear TMs with normal landmarks. Mouth: clear OP, no lesions, edema.  Moist mucous membranes. Dentition in adequate repair. NECK: supple without masses, thyromegaly or bruits. CHEST/PULM:  Clear to auscultation and percussion breath sounds equal no wheeze , rales or rhonchi. No chest wall deformities or tenderness. Breast: normal by inspection . No dimpling, discharge, masses, tenderness or discharge . CV: PMI is nondisplaced, S1 S2 no gallops, murmurs, rubs. Peripheral pulses are full without delay.No JVD .  ABDOMEN: Bowel sounds normal nontender  No guard or rebound, no hepato splenomegal no CVA tenderness.  No hernia. Extremtities:  No clubbing cyanosis or edema, no acute joint swelling or redness no focal atrophy NEURO:  Oriented x3, cranial nerves 3-12 appear to be intact, no obvious focal weakness,gait within normal limits no abnormal reflexes or asymmetrical SKIN: No acute rashes normal turgor, color, no bruising or petechiae. Solare changes  PSYCH: Oriented, good eye contact, no obvious depression anxiety, cognition and judgment appear normal. LN: no cervical axillary inguinal adenopathy No  noted deficits in memory, attention, and speech.   ASSESSMENT AND PLAN:  Discussed the following assessment and plan:  Medicare annual wellness visit, subsequent - dexa due order given to pt  - Plan: Hepatic function panel  Hyperlipidemia - add exer cise diet etc  - Plan: Lipid panel, TSH, Basic metabolic panel, CBC with Differential, Hepatic function panel  Hormone replacement therapy (postmenopausal) - advise dec  as tolerated for hot flushes  - Plan: Lipid panel, TSH, Basic metabolic panel, CBC with Differential, Hepatic function panel  Medication management - continue on the zoloft at this time helful for her  - Plan: Lipid panel, TSH, Basic metabolic panel, CBC with Differential, Hepatic function panel  Estrogen deficiency - dexa scan  when gets mammor  gram order given rx  - Plan: Hepatic function panel  Nasal congestion HO advance directives given . And review Patient Care Team: Burnis Medin, MD as PCP - General (Internal Medicine)  Patient Instructions  Can try nasal cortisone such as nasacort and flonase for congestion. Have to use every day for at least 2 weeks. Continue saline also. Decrease the prempro to 3-5 days per day .   Advanced.  Directives. Get back to exercise to help balance .  Healthy lifestyle includes : At least 150 minutes of exercise weeks  , weight at healthy levels, which is usually   BMI 19-25. Avoid trans fats and processed foods;  Increase fresh fruits and veges to 5 servings per day. And avoid sweet beverages including tea and juice. Mediterranean diet with olive oil and nuts have been noted to be heart and brain healthy . Avoid tobacco products . Limit  alcohol to  7 per week for women and 14 servings for men.  Get adequate sleep . Wear seat belts . Don't text and drive .    Bone density order  Can make appt with [facility] when convenient .     Standley Brooking. Ivyrose Hashman M.D.

## 2014-02-04 NOTE — Patient Instructions (Addendum)
Can try nasal cortisone such as nasacort and flonase for congestion. Have to use every day for at least 2 weeks. Continue saline also. Decrease the prempro to 3-5 days per day .   Advanced.  Directives. Get back to exercise to help balance .  Healthy lifestyle includes : At least 150 minutes of exercise weeks  , weight at healthy levels, which is usually   BMI 19-25. Avoid trans fats and processed foods;  Increase fresh fruits and veges to 5 servings per day. And avoid sweet beverages including tea and juice. Mediterranean diet with olive oil and nuts have been noted to be heart and brain healthy . Avoid tobacco products . Limit  alcohol to  7 per week for women and 14 servings for men.  Get adequate sleep . Wear seat belts . Don't text and drive .    Bone density order  Can make appt with [facility] when convenient .

## 2014-02-07 DIAGNOSIS — M7542 Impingement syndrome of left shoulder: Secondary | ICD-10-CM | POA: Diagnosis not present

## 2014-02-09 ENCOUNTER — Other Ambulatory Visit: Payer: Self-pay | Admitting: Internal Medicine

## 2014-02-11 NOTE — Telephone Encounter (Signed)
Sent to the pharmacy by e-scribe. 

## 2014-02-21 DIAGNOSIS — M899 Disorder of bone, unspecified: Secondary | ICD-10-CM | POA: Diagnosis not present

## 2014-02-27 DIAGNOSIS — H43393 Other vitreous opacities, bilateral: Secondary | ICD-10-CM | POA: Diagnosis not present

## 2014-03-18 DIAGNOSIS — H25013 Cortical age-related cataract, bilateral: Secondary | ICD-10-CM | POA: Diagnosis not present

## 2014-03-18 DIAGNOSIS — H2513 Age-related nuclear cataract, bilateral: Secondary | ICD-10-CM | POA: Diagnosis not present

## 2014-03-18 DIAGNOSIS — H43391 Other vitreous opacities, right eye: Secondary | ICD-10-CM | POA: Diagnosis not present

## 2014-03-20 ENCOUNTER — Other Ambulatory Visit: Payer: Self-pay | Admitting: Internal Medicine

## 2014-03-20 ENCOUNTER — Encounter: Payer: Self-pay | Admitting: Internal Medicine

## 2014-03-20 NOTE — Telephone Encounter (Signed)
Sent to the pharmacy by e-scribe. 

## 2014-09-25 DIAGNOSIS — L309 Dermatitis, unspecified: Secondary | ICD-10-CM | POA: Diagnosis not present

## 2014-09-25 DIAGNOSIS — L821 Other seborrheic keratosis: Secondary | ICD-10-CM | POA: Diagnosis not present

## 2014-09-25 DIAGNOSIS — L812 Freckles: Secondary | ICD-10-CM | POA: Diagnosis not present

## 2014-09-25 DIAGNOSIS — L820 Inflamed seborrheic keratosis: Secondary | ICD-10-CM | POA: Diagnosis not present

## 2014-09-25 DIAGNOSIS — D1801 Hemangioma of skin and subcutaneous tissue: Secondary | ICD-10-CM | POA: Diagnosis not present

## 2014-10-02 DIAGNOSIS — Z1231 Encounter for screening mammogram for malignant neoplasm of breast: Secondary | ICD-10-CM | POA: Diagnosis not present

## 2014-10-02 LAB — HM MAMMOGRAPHY

## 2014-10-07 ENCOUNTER — Encounter: Payer: Self-pay | Admitting: Family Medicine

## 2015-01-18 ENCOUNTER — Other Ambulatory Visit: Payer: Self-pay | Admitting: Internal Medicine

## 2015-01-21 ENCOUNTER — Telehealth: Payer: Self-pay | Admitting: Family Medicine

## 2015-01-21 NOTE — Telephone Encounter (Signed)
Sent to the pharmacy by e-scribe.  Message sent to scheduling.  Pt now due for medicare wellness.

## 2015-01-21 NOTE — Telephone Encounter (Signed)
Pt is now due for medicare wellness exam.  Please contact the pt for fasting appointment.  Thanks!

## 2015-01-21 NOTE — Telephone Encounter (Signed)
lmom for pt to call back

## 2015-01-24 NOTE — Telephone Encounter (Signed)
Pt has been sch

## 2015-01-25 ENCOUNTER — Other Ambulatory Visit: Payer: Self-pay | Admitting: Internal Medicine

## 2015-01-28 NOTE — Telephone Encounter (Signed)
Sent to the pharmacy by e-scribe.  Pt states she is using only 5 days per week.

## 2015-02-26 DIAGNOSIS — M65341 Trigger finger, right ring finger: Secondary | ICD-10-CM | POA: Diagnosis not present

## 2015-03-26 DIAGNOSIS — M65341 Trigger finger, right ring finger: Secondary | ICD-10-CM | POA: Diagnosis not present

## 2015-04-19 ENCOUNTER — Other Ambulatory Visit: Payer: Self-pay | Admitting: Internal Medicine

## 2015-04-21 NOTE — Telephone Encounter (Signed)
Sent to the pharmacy by e-scribe.  Pt has upcoming cpx on 06/11/15

## 2015-05-14 DIAGNOSIS — M65341 Trigger finger, right ring finger: Secondary | ICD-10-CM | POA: Diagnosis not present

## 2015-06-10 NOTE — Progress Notes (Signed)
Chief Complaint  Patient presents with  . Medicare Wellness    HPI: Summer Collins 75 y.o. comes in today for Preventive Medicare wellness visit .Since last visit.  LKG:MWNUUVO to 4 days per week . Doing ok  25 mg  Sertraline except at x mas.   50 mg  Seems to help her  Healthy ls but no exercise reg  Tend to get a off balance feeling a lot no evernt  Vascular events  Vision hearing changes or neuro sx.   Health Maintenance  Topic Date Due  . PNA vac Low Risk Adult (2 of 2 - PPSV23) 12/18/2013  . INFLUENZA VACCINE  08/19/2015  . MAMMOGRAM  10/02/2015  . COLONOSCOPY  09/18/2017  . TETANUS/TDAP  07/26/2019  . DEXA SCAN  Completed  . ZOSTAVAX  Completed   Health Maintenance Review LIFESTYLE:  TAD no tobacco  3-4 etoh week  No exercise   Reg   Active  Sugar beverages:  Lots of splenda and stevia  Sleep: lucky if gets 7     MEDICARE DOCUMENT QUESTIONS  TO SCAN   Hearing:  Ok   Vision:  No limitations at present . Last eye check UTD  Safety:  Has smoke detector and wears seat belts.  No firearms. No excess sun exposure. Sees dentist regularly.  Falls: no  Advance directive :  Reviewed   To be done .Marland Kitchen  Memory: Felt to be good  , no concern from her or her family.  Depression: No anhedonia unusual crying or depressive symptoms  Nutrition: Eats well balanced diet; adequate calcium and vitamin D. No swallowing chewing problems.  Injury: no major injuries in the last six months.  Other healthcare providers:  Reviewed today .  Social:  Lives with spouse married. No pets.  h h of 2   Preventive parameters: up-to-date  Reviewed   ADLS:   There are no problems or need for assistance  driving, feeding, obtaining food, dressing, toileting and bathing, managing money using phone. She is independent.    ROS:  GEN/ HEENT: No fever, significant weight changes sweats headaches vision problems hearing changes, CV/ PULM; No chest pain shortness of breath cough,  syncope,edema  change in exercise tolerance. GI /GU: No adominal pain, vomiting, change in bowel habits. No blood in the stool. No significant GU symptoms. SKIN/HEME: ,no acute skin rashes suspicious lesions or bleeding. No lymphadenopathy, nodules, masses.  NEURO/ PSYCH:  No neurologic signs such as weakness numbness. No depression anxiety. IMM/ Allergy: No unusual infections.  Allergy .   REST of 12 system review negative except as per HPI   Past Medical History  Diagnosis Date  . Hyperlipidemia   . Anxiety      on low dose zoloft for a while  . Vitamin D deficiency   . Osteopenia     Family History  Problem Relation Age of Onset  . Cancer Father     prostate and spread    Social History   Social History  . Marital Status: Married    Spouse Name: N/A  . Number of Children: N/A  . Years of Education: N/A   Social History Main Topics  . Smoking status: Former Research scientist (life sciences)  . Smokeless tobacco: None  . Alcohol Use: 0.5 oz/week    1 drink(s) per week  . Drug Use: No  . Sexual Activity: Not Asked   Other Topics Concern  . 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   retired  Currently in Press photographer.                Outpatient Encounter Prescriptions as of 06/11/2015  Medication Sig  . Calcium Carbonate-Vitamin D (CALCIUM 600 + D PO) Take by mouth. 1 bid     . Cholecalciferol (VITAMIN D3) 1000 UNITS CAPS Take by mouth.  . Cyanocobalamin (VITAMIN B 12 PO) Take by mouth. 2542mg daily  . estrogen, conjugated,-medroxyprogesterone (PREMPRO) 0.3-1.5 MG per tablet TAKE 1 TABLET DAILY, 6 DAYS OUT OF THE WEEK  . Glucos-MSM-C-Mn-Ginger-Willow (GLUCOSAMINE MSM COMPLEX PO) Take by mouth.  . Omega-3 Fatty Acids (FISH OIL) 1000 MG CAPS Take by mouth.  .Marland KitchenPREMPRO 0.3-1.5 MG tablet TAKE ONE TABLET 5 DAYS OUT OF THE WEEK, WEAN AS DIRECTED  .  sertraline (ZOLOFT) 50 MG tablet TAKE 1 TABLET DAILY   No facility-administered encounter medications on file as of 06/11/2015.    EXAM:  BP 130/82 mmHg  Pulse 71  Temp(Src) 98.1 F (36.7 C) (Oral)  Ht _0  (1.651 m)  Wt 160 lb (72.576 kg)  BMI 26.63 kg/m2  SpO2 97%  Body mass index is 26.63 kg/(m^2).  Physical Exam: Vital signs reviewed GMEB:RAXEis a well-developed well-nourished alert cooperative   who appears stated age in no acute distress.  HEENT: normocephalic atraumatic , Eyes: PERRL EOM's full, conjunctiva clear, Nares: paten,t no deformity discharge or tenderness., Ears: no deformity EAC's clear TMs with normal landmarks. Mouth: clear OP, no lesions, edema.  Moist mucous membranes. Dentition in adequate repair. NECK: supple without masses, thyromegaly or bruits. CHEST/PULM:  Clear to auscultation and percussion breath sounds equal no wheeze , rales or rhonchi. No chest wall deformities or tenderness.Breast: normal by inspection . No dimpling, discharge, masses, tenderness or discharge . CV: PMI is nondisplaced, S1 S2 no gallops, murmurs, rubs. Peripheral pulses are full without delay.No JVD .  ABDOMEN: Bowel sounds normal nontender  No guard or rebound, no hepato splenomegal no CVA tenderness.   Extremtities:  No clubbing cyanosis or edema, no acute joint swelling or redness no focal atrophy NEURO:  Oriented x3, cranial nerves 3-12 appear to be intact, no obvious focal weakness,gait within normal limits no abnormal reflexes or asymmetrical SKIN: No acute rashes normal turgor, color, no bruising or petechiae. PSYCH: Oriented, good eye contact, no obvious depression anxiety, cognition and judgment appear normal. LN: no cervical axillary inguinal adenopathy No noted deficits in memory, attention, and speech.   Lab Results  Component Value Date   WBC 6.1 02/04/2014   HGB 13.3 02/04/2014   HCT 40.1 02/04/2014   PLT 285.0 02/04/2014   GLUCOSE 104* 02/04/2014   CHOL 204*  02/04/2014   TRIG 191.0* 02/04/2014   HDL 39.80 02/04/2014   LDLCALC 126* 02/04/2014   ALT 15 02/04/2014   AST 15 02/04/2014   NA 143 02/04/2014   K 4.9 02/04/2014   CL 109 02/04/2014   CREATININE 0.68 02/04/2014   BUN 16 02/04/2014   CO2 28 02/04/2014   TSH 2.04 02/04/2014    ASSESSMENT AND PLAN:  Discussed the following assessment and plan:  Medicare annual wellness visit, subsequent  Hormone replacement therapy (postmenopausal) - Plan: Basic metabolic panel, CBC with Differential/Platelet, Hemoglobin A1c, Lipid panel, Hepatic function panel, TSH, VITAMIN D 25  Hydroxy (Vit-D Deficiency, Fractures)  Hyperlipidemia - Plan: Basic metabolic panel, CBC with Differential/Platelet, Hemoglobin A1c, Lipid panel, Hepatic function panel, TSH, VITAMIN D 25 Hydroxy (Vit-D Deficiency, Fractures)  Medication management - Plan: Basic metabolic panel, CBC with Differential/Platelet, Hemoglobin A1c, Lipid panel, Hepatic function panel, TSH, VITAMIN D 25 Hydroxy (Vit-D Deficiency, Fractures)  Osteopenia - Plan: Basic metabolic panel, CBC with Differential/Platelet, Hemoglobin A1c, Lipid panel, Hepatic function panel, TSH, VITAMIN D 25 Hydroxy (Vit-D Deficiency, Fractures)  Hyperglycemia - Plan: Basic metabolic panel, Hemoglobin A1c  Estrogen deficiency - Plan: VITAMIN D 25 Hydroxy (Vit-D Deficiency, Fractures)  Vitamin D deficiency - Plan: VITAMIN D 25 Hydroxy (Vit-D Deficiency, Fractures)   Recheck lipids bg  adiscactivities and if getting  persistent or progressive sx of  Concern make fu appt   Disc adding yoga or tai chi and balance  conditining Patient Care Team: Burnis Medin, MD as PCP - General (Internal Medicine)  Patient Instructions  Continue reduced  Hormone treatment . ty going down to 3 x per week .   Add exercise  Yoga and or tai chi may be good for balance .  Will notify you  of labs when available.  Exercising to Stay Healthy Exercising regularly is important. It has  many health benefits, such as:  Improving your overall fitness, flexibility, and endurance.  Increasing your bone density.  Helping with weight control.  Decreasing your body fat.  Increasing your muscle strength.  Reducing stress and tension.  Improving your overall health. In order to become healthy and stay healthy, it is recommended that you do moderate-intensity and vigorous-intensity exercise. You can tell that you are exercising at a moderate intensity if you have a higher heart rate and faster breathing, but you are still able to hold a conversation. You can tell that you are exercising at a vigorous intensity if you are breathing much harder and faster and cannot hold a conversation while exercising. HOW OFTEN SHOULD I EXERCISE? Choose an activity that you enjoy and set realistic goals. Your health care provider can help you to make an activity plan that works for you. Exercise regularly as directed by your health care provider. This may include:   Doing resistance training twice each week, such as:  Push-ups.  Sit-ups.  Lifting weights.  Using resistance bands.  Doing a given intensity of exercise for a given amount of time. Choose from these options:  150 minutes of moderate-intensity exercise every week.  75 minutes of vigorous-intensity exercise every week.  A mix of moderate-intensity and vigorous-intensity exercise every week. Children, pregnant women, people who are out of shape, people who are overweight, and older adults may need to consult a health care provider for individual recommendations. If you have any sort of medical condition, be sure to consult your health care provider before starting a new exercise program.  WHAT ARE SOME EXERCISE IDEAS? Some moderate-intensity exercise ideas include:   Walking at a rate of 1 mile in 15 minutes.  Biking.  Hiking.  Golfing.  Dancing. Some vigorous-intensity exercise ideas include:   Walking at a rate of  at least 4.5 miles per hour.  Jogging or running at a rate of 5 miles per hour.  Biking at a rate of at least 10 miles per hour.  Lap swimming.  Roller-skating or in-line skating.  Cross-country skiing.  Vigorous competitive sports, such as football, basketball, and soccer.  Jumping rope.  Aerobic dancing. WHAT ARE SOME EVERYDAY ACTIVITIES THAT CAN HELP ME TO  GET EXERCISE?  Yard work, such as:  Psychologist, educational.  Raking and bagging leaves.  Washing and waxing your car.  Pushing a stroller.  Shoveling snow.  Gardening.  Washing windows or floors. HOW CAN I BE MORE ACTIVE IN MY DAY-TO-DAY ACTIVITIES?  Use the stairs instead of the elevator.  Take a walk during your lunch break.  If you drive, park your car farther away from work or school.  If you take public transportation, get off one stop early and walk the rest of the way.  Make all of your phone calls while standing up and walking around.  Get up, stretch, and walk around every 30 minutes throughout the day. WHAT GUIDELINES SHOULD I FOLLOW WHILE EXERCISING?  Do not exercise so much that you hurt yourself, feel dizzy, or get very short of breath.  Consult your health care provider before starting a new exercise program.  Wear comfortable clothes and shoes with good support.  Drink plenty of water while you exercise to prevent dehydration or heat stroke. Body water is lost during exercise and must be replaced.  Work out until you breathe faster and your heart beats faster.   This information is not intended to replace advice given to you by your health care provider. Make sure you discuss any questions you have with your health care provider.   Document Released: 02/06/2010 Document Revised: 01/25/2014 Document Reviewed: 06/07/2013 Elsevier Interactive Patient Education 2016 [place] Maintenance, Female Adopting a healthy lifestyle and getting preventive care can go a long way to  promote health and wellness. Talk with your health care provider about what schedule of regular examinations is right for you. This is a good chance for you to check in with your provider about disease prevention and staying healthy. In between checkups, there are plenty of things you can do on your own. Experts have done a lot of research about which lifestyle changes and preventive measures are most likely to keep you healthy. Ask your health care provider for more information. WEIGHT AND DIET  Eat a healthy diet  Be sure to include plenty of vegetables, fruits, low-fat dairy products, and lean protein.  Do not eat a lot of foods high in solid fats, added sugars, or salt.  Get regular exercise. This is one of the most important things you can do for your health.  Most adults should exercise for at least 150 minutes each week. The exercise should increase your heart rate and make you sweat (moderate-intensity exercise).  Most adults should also do strengthening exercises at least twice a week. This is in addition to the moderate-intensity exercise.  Maintain a healthy weight  Body mass index (BMI) is a measurement that can be used to identify possible weight problems. It estimates body fat based on height and weight. Your health care provider can help determine your BMI and help you achieve or maintain a healthy weight.  For females 25 years of age and older:   A BMI below 18.5 is considered underweight.  A BMI of 18.5 to 24.9 is normal.  A BMI of 25 to 29.9 is considered overweight.  A BMI of 30 and above is considered obese.  Watch levels of cholesterol and blood lipids  You should start having your blood tested for lipids and cholesterol at 75 years of age, then have this test every 5 years.  You may need to have your cholesterol levels checked more often if:  Your lipid or cholesterol  levels are high.  You are older than 75 years of age.  You are at high risk for heart  disease.  CANCER SCREENING   Lung Cancer  Lung cancer screening is recommended for adults 107-25 years old who are at high risk for lung cancer because of a history of smoking.  A yearly low-dose CT scan of the lungs is recommended for people who:  Currently smoke.  Have quit within the past 15 years.  Have at least a 30-pack-year history of smoking. A pack year is smoking an average of one pack of cigarettes a day for 1 year.  Yearly screening should continue until it has been 15 years since you quit.  Yearly screening should stop if you develop a health problem that would prevent you from having lung cancer treatment.  Breast Cancer  Practice breast self-awareness. This means understanding how your breasts normally appear and feel.  It also means doing regular breast self-exams. Let your health care provider know about any changes, no matter how small.  If you are in your 20s or 30s, you should have a clinical breast exam (CBE) by a health care provider every 1-3 years as part of a regular health exam.  If you are 63 or older, have a CBE every year. Also consider having a breast X-ray (mammogram) every year.  If you have a family history of breast cancer, talk to your health care provider about genetic screening.  If you are at high risk for breast cancer, talk to your health care provider about having an MRI and a mammogram every year.  Breast cancer gene (BRCA) assessment is recommended for women who have family members with BRCA-related cancers. BRCA-related cancers include:  Breast.  Ovarian.  Tubal.  Peritoneal cancers.  Results of the assessment will determine the need for genetic counseling and BRCA1 and BRCA2 testing. Cervical Cancer Your health care provider may recommend that you be screened regularly for cancer of the pelvic organs (ovaries, uterus, and vagina). This screening involves a pelvic examination, including checking for microscopic changes to the  surface of your cervix (Pap test). You may be encouraged to have this screening done every 3 years, beginning at age 28.  For women ages 58-65, health care providers may recommend pelvic exams and Pap testing every 3 years, or they may recommend the Pap and pelvic exam, combined with testing for human papilloma virus (HPV), every 5 years. Some types of HPV increase your risk of cervical cancer. Testing for HPV may also be done on women of any age with unclear Pap test results.  Other health care providers may not recommend any screening for nonpregnant women who are considered low risk for pelvic cancer and who do not have symptoms. Ask your health care provider if a screening pelvic exam is right for you.  If you have had past treatment for cervical cancer or a condition that could lead to cancer, you need Pap tests and screening for cancer for at least 20 years after your treatment. If Pap tests have been discontinued, your risk factors (such as having a new sexual partner) need to be reassessed to determine if screening should resume. Some women have medical problems that increase the chance of getting cervical cancer. In these cases, your health care provider may recommend more frequent screening and Pap tests. Colorectal Cancer  This type of cancer can be detected and often prevented.  Routine colorectal cancer screening usually begins at 75 years of age and continues  through 75 years of age.  Your health care provider may recommend screening at an earlier age if you have risk factors for colon cancer.  Your health care provider may also recommend using home test kits to check for hidden blood in the stool.  A small camera at the end of a tube can be used to examine your colon directly (sigmoidoscopy or colonoscopy). This is done to check for the earliest forms of colorectal cancer.  Routine screening usually begins at age 89.  Direct examination of the colon should be repeated every 5-10  years through 75 years of age. However, you may need to be screened more often if early forms of precancerous polyps or small growths are found. Skin Cancer  Check your skin from head to toe regularly.  Tell your health care provider about any new moles or changes in moles, especially if there is a change in a mole's shape or color.  Also tell your health care provider if you have a mole that is larger than the size of a pencil eraser.  Always use sunscreen. Apply sunscreen liberally and repeatedly throughout the day.  Protect yourself by wearing long sleeves, pants, a wide-brimmed hat, and sunglasses whenever you are outside. HEART DISEASE, DIABETES, AND HIGH BLOOD PRESSURE   High blood pressure causes heart disease and increases the risk of stroke. High blood pressure is more likely to develop in:  People who have blood pressure in the high end of the normal range (130-139/85-89 mm Hg).  People who are overweight or obese.  People who are African American.  If you are 67-47 years of age, have your blood pressure checked every 3-5 years. If you are 48 years of age or older, have your blood pressure checked every year. You should have your blood pressure measured twice--once when you are at a hospital or clinic, and once when you are not at a hospital or clinic. Record the average of the two measurements. To check your blood pressure when you are not at a hospital or clinic, you can use:  An automated blood pressure machine at a pharmacy.  A home blood pressure monitor.  If you are between 64 years and 59 years old, ask your health care provider if you should take aspirin to prevent strokes.  Have regular diabetes screenings. This involves taking a blood sample to check your fasting blood sugar level.  If you are at a normal weight and have a low risk for diabetes, have this test once every three years after 74 years of age.  If you are overweight and have a high risk for diabetes,  consider being tested at a younger age or more often. PREVENTING INFECTION  Hepatitis B  If you have a higher risk for hepatitis B, you should be screened for this virus. You are considered at high risk for hepatitis B if:  You were born in a country where hepatitis B is common. Ask your health care provider which countries are considered high risk.  Your parents were born in a high-risk country, and you have not been immunized against hepatitis B (hepatitis B vaccine).  You have HIV or AIDS.  You use needles to inject street drugs.  You live with someone who has hepatitis B.  You have had sex with someone who has hepatitis B.  You get hemodialysis treatment.  You take certain medicines for conditions, including cancer, organ transplantation, and autoimmune conditions. Hepatitis C  Blood testing is recommended for:  Everyone born from [birth year] through 1965.  Anyone with known risk factors for hepatitis C. Sexually transmitted infections (STIs)  You should be screened for sexually transmitted infections (STIs) including gonorrhea and chlamydia if:  You are sexually active and are younger than 75 years of age.  You are older than 75 years of age and your health care provider tells you that you are at risk for this type of infection.  Your sexual activity has changed since you were last screened and you are at an increased risk for chlamydia or gonorrhea. Ask your health care provider if you are at risk.  If you do not have HIV, but are at risk, it may be recommended that you take a prescription medicine daily to prevent HIV infection. This is called pre-exposure prophylaxis (PrEP). You are considered at risk if:  You are sexually active and do not regularly use condoms or know the HIV status of your partner(s).  You take drugs by injection.  You are sexually active with a partner who has HIV. Talk with your health care provider about whether you are at high risk of being  infected with HIV. If you choose to begin PrEP, you should first be tested for HIV. You should then be tested every 3 months for as long as you are taking PrEP.  PREGNANCY   If you are premenopausal and you may become pregnant, ask your health care provider about preconception counseling.  If you may become pregnant, take 400 to 800 micrograms (mcg) of folic acid every day.  If you want to prevent pregnancy, talk to your health care provider about birth control (contraception). OSTEOPOROSIS AND MENOPAUSE   Osteoporosis is a disease in which the bones lose minerals and strength with aging. This can result in serious bone fractures. Your risk for osteoporosis can be identified using a bone density scan.  If you are 73 years of age or older, or if you are at risk for osteoporosis and fractures, ask your health care provider if you should be screened.  Ask your health care provider whether you should take a calcium or vitamin D supplement to lower your risk for osteoporosis.  Menopause may have certain physical symptoms and risks.  Hormone replacement therapy may reduce some of these symptoms and risks. Talk to your health care provider about whether hormone replacement therapy is right for you.  HOME CARE INSTRUCTIONS   Schedule regular health, dental, and eye exams.  Stay current with your immunizations.   Do not use any tobacco products including cigarettes, chewing tobacco, or electronic cigarettes.  If you are pregnant, do not drink alcohol.  If you are breastfeeding, limit how much and how often you drink alcohol.  Limit alcohol intake to no more than 1 drink per day for nonpregnant women. One drink equals 12 ounces of beer, 5 ounces of wine, or 1 ounces of hard liquor.  Do not use street drugs.  Do not share needles.  Ask your health care provider for help if you need support or information about quitting drugs.  Tell your health care provider if you often feel  depressed.  Tell your health care provider if you have ever been abused or do not feel safe at home.   This information is not intended to replace advice given to you by your health care provider. Make sure you discuss any questions you have with your health care provider.   Document Released: 07/20/2010 Document Revised: 01/25/2014 Document Reviewed: 12/06/2012 Elsevier Interactive  Patient Education 2016 [place] Jasn Xia M.D.

## 2015-06-11 ENCOUNTER — Encounter: Payer: Self-pay | Admitting: Internal Medicine

## 2015-06-11 ENCOUNTER — Ambulatory Visit (INDEPENDENT_AMBULATORY_CARE_PROVIDER_SITE_OTHER): Payer: Medicare Other | Admitting: Internal Medicine

## 2015-06-11 VITALS — BP 130/82 | HR 71 | Temp 98.1°F | Ht 65.0 in | Wt 160.0 lb

## 2015-06-11 DIAGNOSIS — Z0000 Encounter for general adult medical examination without abnormal findings: Secondary | ICD-10-CM | POA: Diagnosis not present

## 2015-06-11 DIAGNOSIS — E559 Vitamin D deficiency, unspecified: Secondary | ICD-10-CM

## 2015-06-11 DIAGNOSIS — E2839 Other primary ovarian failure: Secondary | ICD-10-CM

## 2015-06-11 DIAGNOSIS — M8580 Other specified disorders of bone density and structure, unspecified site: Secondary | ICD-10-CM | POA: Diagnosis not present

## 2015-06-11 DIAGNOSIS — E785 Hyperlipidemia, unspecified: Secondary | ICD-10-CM | POA: Diagnosis not present

## 2015-06-11 DIAGNOSIS — Z79890 Hormone replacement therapy (postmenopausal): Secondary | ICD-10-CM

## 2015-06-11 DIAGNOSIS — Z79899 Other long term (current) drug therapy: Secondary | ICD-10-CM

## 2015-06-11 DIAGNOSIS — R739 Hyperglycemia, unspecified: Secondary | ICD-10-CM

## 2015-06-11 LAB — LIPID PANEL
CHOLESTEROL: 222 mg/dL — AB (ref 0–200)
HDL: 35.1 mg/dL — AB (ref >39.00)
LDL CALC: 148 mg/dL — AB (ref 0–99)
NonHDL: 187.12
Total CHOL/HDL Ratio: 6
Triglycerides: 197 mg/dL — ABNORMAL HIGH (ref 0.0–149.0)
VLDL: 39.4 mg/dL (ref 0.0–40.0)

## 2015-06-11 LAB — CBC WITH DIFFERENTIAL/PLATELET
BASOS PCT: 0.9 % (ref 0.0–3.0)
Basophils Absolute: 0 10*3/uL (ref 0.0–0.1)
EOS ABS: 0.2 10*3/uL (ref 0.0–0.7)
EOS PCT: 3 % (ref 0.0–5.0)
HEMATOCRIT: 39.9 % (ref 36.0–46.0)
Hemoglobin: 13.4 g/dL (ref 12.0–15.0)
Lymphocytes Relative: 34.9 % (ref 12.0–46.0)
Lymphs Abs: 1.9 10*3/uL (ref 0.7–4.0)
MCHC: 33.6 g/dL (ref 30.0–36.0)
MCV: 90.1 fl (ref 78.0–100.0)
Monocytes Absolute: 0.6 10*3/uL (ref 0.1–1.0)
Monocytes Relative: 10 % (ref 3.0–12.0)
Neutro Abs: 2.8 10*3/uL (ref 1.4–7.7)
Neutrophils Relative %: 51.2 % (ref 43.0–77.0)
Platelets: 264 10*3/uL (ref 150.0–400.0)
RBC: 4.43 Mil/uL (ref 3.87–5.11)
RDW: 13.6 % (ref 11.5–15.5)
WBC: 5.5 10*3/uL (ref 4.0–10.5)

## 2015-06-11 LAB — HEPATIC FUNCTION PANEL
ALT: 15 U/L (ref 0–35)
AST: 15 U/L (ref 0–37)
Albumin: 4.2 g/dL (ref 3.5–5.2)
Alkaline Phosphatase: 38 U/L — ABNORMAL LOW (ref 39–117)
BILIRUBIN TOTAL: 0.4 mg/dL (ref 0.2–1.2)
Bilirubin, Direct: 0.1 mg/dL (ref 0.0–0.3)
TOTAL PROTEIN: 6.6 g/dL (ref 6.0–8.3)

## 2015-06-11 LAB — BASIC METABOLIC PANEL
BUN: 17 mg/dL (ref 6–23)
CHLORIDE: 106 meq/L (ref 96–112)
CO2: 29 mEq/L (ref 19–32)
CREATININE: 0.68 mg/dL (ref 0.40–1.20)
Calcium: 9.5 mg/dL (ref 8.4–10.5)
GFR: 89.71 mL/min (ref >60.00)
Glucose, Bld: 90 mg/dL (ref 70–99)
POTASSIUM: 4.6 meq/L (ref 3.5–5.1)
Sodium: 141 mEq/L (ref 135–145)

## 2015-06-11 LAB — HEMOGLOBIN A1C: Hgb A1c MFr Bld: 6.1 % (ref 4.6–6.5)

## 2015-06-11 LAB — TSH: TSH: 2.53 u[IU]/mL (ref 0.35–4.50)

## 2015-06-11 LAB — VITAMIN D 25 HYDROXY (VIT D DEFICIENCY, FRACTURES): VITD: 39.06 ng/mL (ref 30.00–100.00)

## 2015-06-11 NOTE — Patient Instructions (Addendum)
Continue reduced  Hormone treatment . ty going down to 3 x per week .   Add exercise  Yoga and or tai chi may be good for balance .  Will notify you  of labs when available.  Exercising to Stay Healthy Exercising regularly is important. It has many health benefits, such as:  Improving your overall fitness, flexibility, and endurance.  Increasing your bone density.  Helping with weight control.  Decreasing your body fat.  Increasing your muscle strength.  Reducing stress and tension.  Improving your overall health. In order to become healthy and stay healthy, it is recommended that you do moderate-intensity and vigorous-intensity exercise. You can tell that you are exercising at a moderate intensity if you have a higher heart rate and faster breathing, but you are still able to hold a conversation. You can tell that you are exercising at a vigorous intensity if you are breathing much harder and faster and cannot hold a conversation while exercising. HOW OFTEN SHOULD I EXERCISE? Choose an activity that you enjoy and set realistic goals. Your health care provider can help you to make an activity plan that works for you. Exercise regularly as directed by your health care provider. This may include:   Doing resistance training twice each week, such as:  Push-ups.  Sit-ups.  Lifting weights.  Using resistance bands.  Doing a given intensity of exercise for a given amount of time. Choose from these options:  150 minutes of moderate-intensity exercise every week.  75 minutes of vigorous-intensity exercise every week.  A mix of moderate-intensity and vigorous-intensity exercise every week. Children, pregnant women, people who are out of shape, people who are overweight, and older adults may need to consult a health care provider for individual recommendations. If you have any sort of medical condition, be sure to consult your health care provider before starting a new exercise  program.  WHAT ARE SOME EXERCISE IDEAS? Some moderate-intensity exercise ideas include:   Walking at a rate of 1 mile in 15 minutes.  Biking.  Hiking.  Golfing.  Dancing. Some vigorous-intensity exercise ideas include:   Walking at a rate of at least 4.5 miles per hour.  Jogging or running at a rate of 5 miles per hour.  Biking at a rate of at least 10 miles per hour.  Lap swimming.  Roller-skating or in-line skating.  Cross-country skiing.  Vigorous competitive sports, such as football, basketball, and soccer.  Jumping rope.  Aerobic dancing. WHAT ARE SOME EVERYDAY ACTIVITIES THAT CAN HELP ME TO GET EXERCISE?  Yard work, such as:  Psychologist, educational.  Raking and bagging leaves.  Washing and waxing your car.  Pushing a stroller.  Shoveling snow.  Gardening.  Washing windows or floors. HOW CAN I BE MORE ACTIVE IN MY DAY-TO-DAY ACTIVITIES?  Use the stairs instead of the elevator.  Take a walk during your lunch break.  If you drive, park your car farther away from work or school.  If you take public transportation, get off one stop early and walk the rest of the way.  Make all of your phone calls while standing up and walking around.  Get up, stretch, and walk around every 30 minutes throughout the day. WHAT GUIDELINES SHOULD I FOLLOW WHILE EXERCISING?  Do not exercise so much that you hurt yourself, feel dizzy, or get very short of breath.  Consult your health care provider before starting a new exercise program.  Wear comfortable clothes and shoes with good  support.  Drink plenty of water while you exercise to prevent dehydration or heat stroke. Body water is lost during exercise and must be replaced.  Work out until you breathe faster and your heart beats faster.   This information is not intended to replace advice given to you by your health care provider. Make sure you discuss any questions you have with your health care provider.    Document Released: 02/06/2010 Document Revised: 01/25/2014 Document Reviewed: 06/07/2013 Elsevier Interactive Patient Education 2016 [place] Maintenance, Female Adopting a healthy lifestyle and getting preventive care can go a long way to promote health and wellness. Talk with your health care provider about what schedule of regular examinations is right for you. This is a good chance for you to check in with your provider about disease prevention and staying healthy. In between checkups, there are plenty of things you can do on your own. Experts have done a lot of research about which lifestyle changes and preventive measures are most likely to keep you healthy. Ask your health care provider for more information. WEIGHT AND DIET  Eat a healthy diet  Be sure to include plenty of vegetables, fruits, low-fat dairy products, and lean protein.  Do not eat a lot of foods high in solid fats, added sugars, or salt.  Get regular exercise. This is one of the most important things you can do for your health.  Most adults should exercise for at least 150 minutes each week. The exercise should increase your heart rate and make you sweat (moderate-intensity exercise).  Most adults should also do strengthening exercises at least twice a week. This is in addition to the moderate-intensity exercise.  Maintain a healthy weight  Body mass index (BMI) is a measurement that can be used to identify possible weight problems. It estimates body fat based on height and weight. Your health care provider can help determine your BMI and help you achieve or maintain a healthy weight.  For females 12 years of age and older:   A BMI below 18.5 is considered underweight.  A BMI of 18.5 to 24.9 is normal.  A BMI of 25 to 29.9 is considered overweight.  A BMI of 30 and above is considered obese.  Watch levels of cholesterol and blood lipids  You should start having your blood tested for lipids and  cholesterol at 75 years of age, then have this test every 5 years.  You may need to have your cholesterol levels checked more often if:  Your lipid or cholesterol levels are high.  You are older than 75 years of age.  You are at high risk for heart disease.  CANCER SCREENING   Lung Cancer  Lung cancer screening is recommended for adults 49-68 years old who are at high risk for lung cancer because of a history of smoking.  A yearly low-dose CT scan of the lungs is recommended for people who:  Currently smoke.  Have quit within the past 15 years.  Have at least a 30-pack-year history of smoking. A pack year is smoking an average of one pack of cigarettes a day for 1 year.  Yearly screening should continue until it has been 15 years since you quit.  Yearly screening should stop if you develop a health problem that would prevent you from having lung cancer treatment.  Breast Cancer  Practice breast self-awareness. This means understanding how your breasts normally appear and feel.  It also means doing regular breast self-exams. Let  your health care provider know about any changes, no matter how small.  If you are in your 20s or 30s, you should have a clinical breast exam (CBE) by a health care provider every 1-3 years as part of a regular health exam.  If you are 46 or older, have a CBE every year. Also consider having a breast X-ray (mammogram) every year.  If you have a family history of breast cancer, talk to your health care provider about genetic screening.  If you are at high risk for breast cancer, talk to your health care provider about having an MRI and a mammogram every year.  Breast cancer gene (BRCA) assessment is recommended for women who have family members with BRCA-related cancers. BRCA-related cancers include:  Breast.  Ovarian.  Tubal.  Peritoneal cancers.  Results of the assessment will determine the need for genetic counseling and BRCA1 and BRCA2  testing. Cervical Cancer Your health care provider may recommend that you be screened regularly for cancer of the pelvic organs (ovaries, uterus, and vagina). This screening involves a pelvic examination, including checking for microscopic changes to the surface of your cervix (Pap test). You may be encouraged to have this screening done every 3 years, beginning at age 3.  For women ages 21-65, health care providers may recommend pelvic exams and Pap testing every 3 years, or they may recommend the Pap and pelvic exam, combined with testing for human papilloma virus (HPV), every 5 years. Some types of HPV increase your risk of cervical cancer. Testing for HPV may also be done on women of any age with unclear Pap test results.  Other health care providers may not recommend any screening for nonpregnant women who are considered low risk for pelvic cancer and who do not have symptoms. Ask your health care provider if a screening pelvic exam is right for you.  If you have had past treatment for cervical cancer or a condition that could lead to cancer, you need Pap tests and screening for cancer for at least 20 years after your treatment. If Pap tests have been discontinued, your risk factors (such as having a new sexual partner) need to be reassessed to determine if screening should resume. Some women have medical problems that increase the chance of getting cervical cancer. In these cases, your health care provider may recommend more frequent screening and Pap tests. Colorectal Cancer  This type of cancer can be detected and often prevented.  Routine colorectal cancer screening usually begins at 75 years of age and continues through 75 years of age.  Your health care provider may recommend screening at an earlier age if you have risk factors for colon cancer.  Your health care provider may also recommend using home test kits to check for hidden blood in the stool.  A small camera at the end of a  tube can be used to examine your colon directly (sigmoidoscopy or colonoscopy). This is done to check for the earliest forms of colorectal cancer.  Routine screening usually begins at age 69.  Direct examination of the colon should be repeated every 5-10 years through 75 years of age. However, you may need to be screened more often if early forms of precancerous polyps or small growths are found. Skin Cancer  Check your skin from head to toe regularly.  Tell your health care provider about any new moles or changes in moles, especially if there is a change in a mole's shape or color.  Also tell your  health care provider if you have a mole that is larger than the size of a pencil eraser.  Always use sunscreen. Apply sunscreen liberally and repeatedly throughout the day.  Protect yourself by wearing long sleeves, pants, a wide-brimmed hat, and sunglasses whenever you are outside. HEART DISEASE, DIABETES, AND HIGH BLOOD PRESSURE   High blood pressure causes heart disease and increases the risk of stroke. High blood pressure is more likely to develop in:  People who have blood pressure in the high end of the normal range (130-139/85-89 mm Hg).  People who are overweight or obese.  People who are African American.  If you are 52-50 years of age, have your blood pressure checked every 3-5 years. If you are 68 years of age or older, have your blood pressure checked every year. You should have your blood pressure measured twice--once when you are at a hospital or clinic, and once when you are not at a hospital or clinic. Record the average of the two measurements. To check your blood pressure when you are not at a hospital or clinic, you can use:  An automated blood pressure machine at a pharmacy.  A home blood pressure monitor.  If you are between 10 years and 53 years old, ask your health care provider if you should take aspirin to prevent strokes.  Have regular diabetes screenings. This  involves taking a blood sample to check your fasting blood sugar level.  If you are at a normal weight and have a low risk for diabetes, have this test once every three years after 75 years of age.  If you are overweight and have a high risk for diabetes, consider being tested at a younger age or more often. PREVENTING INFECTION  Hepatitis B  If you have a higher risk for hepatitis B, you should be screened for this virus. You are considered at high risk for hepatitis B if:  You were born in a country where hepatitis B is common. Ask your health care provider which countries are considered high risk.  Your parents were born in a high-risk country, and you have not been immunized against hepatitis B (hepatitis B vaccine).  You have HIV or AIDS.  You use needles to inject street drugs.  You live with someone who has hepatitis B.  You have had sex with someone who has hepatitis B.  You get hemodialysis treatment.  You take certain medicines for conditions, including cancer, organ transplantation, and autoimmune conditions. Hepatitis C  Blood testing is recommended for:  Everyone born from [birth year] through 1965.  Anyone with known risk factors for hepatitis C. Sexually transmitted infections (STIs)  You should be screened for sexually transmitted infections (STIs) including gonorrhea and chlamydia if:  You are sexually active and are younger than 75 years of age.  You are older than 75 years of age and your health care provider tells you that you are at risk for this type of infection.  Your sexual activity has changed since you were last screened and you are at an increased risk for chlamydia or gonorrhea. Ask your health care provider if you are at risk.  If you do not have HIV, but are at risk, it may be recommended that you take a prescription medicine daily to prevent HIV infection. This is called pre-exposure prophylaxis (PrEP). You are considered at risk if:  You are  sexually active and do not regularly use condoms or know the HIV status of your partner(s).  You  take drugs by injection.  You are sexually active with a partner who has HIV. Talk with your health care provider about whether you are at high risk of being infected with HIV. If you choose to begin PrEP, you should first be tested for HIV. You should then be tested every 3 months for as long as you are taking PrEP.  PREGNANCY   If you are premenopausal and you may become pregnant, ask your health care provider about preconception counseling.  If you may become pregnant, take 400 to 800 micrograms (mcg) of folic acid every day.  If you want to prevent pregnancy, talk to your health care provider about birth control (contraception). OSTEOPOROSIS AND MENOPAUSE   Osteoporosis is a disease in which the bones lose minerals and strength with aging. This can result in serious bone fractures. Your risk for osteoporosis can be identified using a bone density scan.  If you are 53 years of age or older, or if you are at risk for osteoporosis and fractures, ask your health care provider if you should be screened.  Ask your health care provider whether you should take a calcium or vitamin D supplement to lower your risk for osteoporosis.  Menopause may have certain physical symptoms and risks.  Hormone replacement therapy may reduce some of these symptoms and risks. Talk to your health care provider about whether hormone replacement therapy is right for you.  HOME CARE INSTRUCTIONS   Schedule regular health, dental, and eye exams.  Stay current with your immunizations.   Do not use any tobacco products including cigarettes, chewing tobacco, or electronic cigarettes.  If you are pregnant, do not drink alcohol.  If you are breastfeeding, limit how much and how often you drink alcohol.  Limit alcohol intake to no more than 1 drink per day for nonpregnant women. One drink equals 12 ounces of beer, 5  ounces of wine, or 1 ounces of hard liquor.  Do not use street drugs.  Do not share needles.  Ask your health care provider for help if you need support or information about quitting drugs.  Tell your health care provider if you often feel depressed.  Tell your health care provider if you have ever been abused or do not feel safe at home.   This information is not intended to replace advice given to you by your health care provider. Make sure you discuss any questions you have with your health care provider.   Document Released: 07/20/2010 Document Revised: 01/25/2014 Document Reviewed: 12/06/2012 Elsevier Interactive Patient Education [employer].

## 2015-06-11 NOTE — Progress Notes (Signed)
Pre visit review using our clinic review tool, if applicable. No additional management support is needed unless otherwise documented below in the visit note. 

## 2015-06-19 ENCOUNTER — Encounter: Payer: Self-pay | Admitting: *Deleted

## 2015-07-03 ENCOUNTER — Other Ambulatory Visit: Payer: Self-pay | Admitting: Internal Medicine

## 2015-07-03 NOTE — Telephone Encounter (Signed)
Sent to the pharmacy by e-scribe. 

## 2015-07-20 ENCOUNTER — Other Ambulatory Visit: Payer: Self-pay | Admitting: Internal Medicine

## 2015-07-21 NOTE — Telephone Encounter (Signed)
Sent to the pharmacy by e-scribe. 

## 2015-09-25 DIAGNOSIS — L814 Other melanin hyperpigmentation: Secondary | ICD-10-CM | POA: Diagnosis not present

## 2015-09-25 DIAGNOSIS — D1801 Hemangioma of skin and subcutaneous tissue: Secondary | ICD-10-CM | POA: Diagnosis not present

## 2015-09-25 DIAGNOSIS — Z85828 Personal history of other malignant neoplasm of skin: Secondary | ICD-10-CM | POA: Diagnosis not present

## 2015-09-25 DIAGNOSIS — L821 Other seborrheic keratosis: Secondary | ICD-10-CM | POA: Diagnosis not present

## 2015-09-25 DIAGNOSIS — L570 Actinic keratosis: Secondary | ICD-10-CM | POA: Diagnosis not present

## 2015-10-03 DIAGNOSIS — Z1231 Encounter for screening mammogram for malignant neoplasm of breast: Secondary | ICD-10-CM | POA: Diagnosis not present

## 2015-10-03 LAB — HM MAMMOGRAPHY

## 2015-10-07 ENCOUNTER — Encounter: Payer: Self-pay | Admitting: Family Medicine

## 2015-12-04 ENCOUNTER — Ambulatory Visit (INDEPENDENT_AMBULATORY_CARE_PROVIDER_SITE_OTHER): Payer: Medicare Other

## 2015-12-04 DIAGNOSIS — Z23 Encounter for immunization: Secondary | ICD-10-CM | POA: Diagnosis not present

## 2015-12-04 DIAGNOSIS — L821 Other seborrheic keratosis: Secondary | ICD-10-CM | POA: Diagnosis not present

## 2015-12-04 DIAGNOSIS — L570 Actinic keratosis: Secondary | ICD-10-CM | POA: Diagnosis not present

## 2016-02-23 ENCOUNTER — Telehealth: Payer: Self-pay | Admitting: Internal Medicine

## 2016-02-23 NOTE — Telephone Encounter (Signed)
Pt is scheduled for 2:15 PM on 02/24/16

## 2016-02-23 NOTE — Telephone Encounter (Signed)
Patient Name: Summer Collins  DOB: 1940/06/08    Initial Comment Caller states has upper resp congestion, coughing, asking if Z-pack would help   Nurse Assessment  Nurse: Raphael Gibney, RN, Vanita Ingles Date/Time (Eastern Time): 02/23/2016 10:50:43 AM  Confirm and document reason for call. If symptomatic, describe symptoms. ---Caller states she has nasal congestion and she is coughing. She has been taking [facility] decongestant and mucinex. Symptoms started on Friday. No fever.  Does the patient have any new or worsening symptoms? ---Yes  Will a triage be completed? ---Yes  Related visit to physician within the last 2 weeks? ---No  Does the PT have any chronic conditions? (i.e. diabetes, asthma, etc.) ---No  Is this a behavioral health or substance abuse call? ---No     Guidelines    Guideline Title Affirmed Question Affirmed Notes  Sinus Pain or Congestion Lots of coughing    Final Disposition User   See PCP When Office is Open (within 3 days) Raphael Gibney, Therapist, sports, Vanita Ingles    Comments  Pt states she does not have the energy to come in for an appt. She wants to know if she needs a Z pack or another antibiotic called in. Please call pt back regarding medication.   Disagree/Comply: Disagree  Disagree/Comply Reason: Disagree with instructions

## 2016-02-23 NOTE — Telephone Encounter (Signed)
Need OV to assess  What is best therapy

## 2016-02-23 NOTE — Telephone Encounter (Signed)
No fever or sob, Nasal Congestion (clear) and a productive cough (clear). Feels tired.  No chest pains or pain in her back.  Started Friday.  Husband treated with a Z-pak for same illness.    Treating with NyQuil at ngiht.  Mucinex and a [facility] decongestant.    Pt requesting antibiotic be sent to [facility] on [address].

## 2016-02-24 ENCOUNTER — Encounter: Payer: Self-pay | Admitting: Internal Medicine

## 2016-02-24 ENCOUNTER — Ambulatory Visit (INDEPENDENT_AMBULATORY_CARE_PROVIDER_SITE_OTHER): Payer: Medicare Other | Admitting: Internal Medicine

## 2016-02-24 VITALS — BP 126/74 | HR 95 | Temp 97.9°F

## 2016-02-24 DIAGNOSIS — J988 Other specified respiratory disorders: Secondary | ICD-10-CM

## 2016-02-24 DIAGNOSIS — B9789 Other viral agents as the cause of diseases classified elsewhere: Secondary | ICD-10-CM | POA: Diagnosis not present

## 2016-02-24 MED ORDER — BENZONATATE 100 MG PO CAPS: 100.0000 mg | ORAL_CAPSULE | Freq: Three times a day (TID) | ORAL | 0 refills | Status: DC | PRN

## 2016-02-24 NOTE — Patient Instructions (Addendum)
I think this is a viral respiratory infection  That should cure itself  And no signs of pneumonia .   YOu may cough for another week  Or so but  Continue to  feeling better  As the week goes on .  Rest fluids and   meds for comfort ( not cure)    as tolerated .   If   You get fever chills  And relapsing sx   After getting better  Contact us  Concern about other infections   Can try  Tessalon perles   To try for help with cough .Marland Kitchen But not curative .   Hot fluids  Humidified air can help soothe the airway .

## 2016-02-24 NOTE — Progress Notes (Signed)
Pre visit review using our clinic review tool, if applicable. No additional management support is needed unless otherwise documented below in the visit note.  Chief Complaint  Patient presents with  . Cough  . Hoarse  . Shortness of Breath  . Nasal Congestion  . Fatigue    HPI: Summer Collins 76 y.o.  sda  For illness   See phone note  Husband had illness and given z pack  Onset about 4 days ago drippy nose felt bad yesterday.Marland Kitchen Hoarse coughing up some colored phlegm. No hemoptysis. Not as drippy and getting  Out.    Breathing through mouth and dryness  mucinex  And old nyquil   And [facility]  Pill decongestant and saline   Feels better today than yesterday. No fever at this time see above ROS: See pertinent positives and negatives per HPI.  Past Medical History:  Diagnosis Date  . Anxiety     on low dose zoloft for a while  . Hyperlipidemia   . Osteopenia   . Vitamin D deficiency     Family History  Problem Relation Age of Onset  . Cancer Father     prostate and spread    Social History   Social History  . Marital status: Married    Spouse name: N/A  . Number of children: N/A  . Years of education: N/A   Social History Main Topics  . Smoking status: Former Research scientist (life sciences)  . Smokeless tobacco: Never Used  . Alcohol use 0.5 oz/week    1 Standard drinks or equivalent per week  . Drug use: No  . Sexual activity: Not Asked   Other Topics Concern  . 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   retired  Currently in Press photographer.                Outpatient Medications Prior to Visit  Medication Sig Dispense Refill  . Calcium Carbonate-Vitamin D (CALCIUM 600 + D PO) Take by mouth. 1 bid       . Cholecalciferol (VITAMIN D3) 1000 UNITS CAPS Take by mouth.    . Cyanocobalamin (VITAMIN B 12 PO) Take by mouth. 2587mcg daily    . estrogen,  conjugated,-medroxyprogesterone (PREMPRO) 0.3-1.5 MG per tablet TAKE 1 TABLET DAILY, 6 DAYS OUT OF THE WEEK    . Glucos-MSM-C-Mn-Ginger-Willow (GLUCOSAMINE MSM COMPLEX PO) Take by mouth.    . Omega-3 Fatty Acids (FISH OIL) 1000 MG CAPS Take by mouth.    Marland Kitchen PREMPRO 0.3-1.5 MG tablet TAKE ONE TABLET 5 DAYS OUT OF THE WEEK, WEAN AS DIRECTED 56 tablet 2  . sertraline (ZOLOFT) 50 MG tablet TAKE 1 TABLET DAILY 90 tablet 2   No facility-administered medications prior to visit.      EXAM:  BP 126/74 (BP Location: Right Arm, Patient Position: Sitting, Cuff Size: Normal)   Pulse 95   Temp 97.9 F (36.6 C) (Oral)   SpO2 96%   There is no height or weight on file to calculate BMI. WDWN in NAD  quiet respirations; mildly congested  somewhat hoarse. Non toxic . HEENT: Normocephalic ;atraumatic , Eyes;  PERRL, EOMs  Full, lids and conjunctiva clear,,Ears: no deformities, canals nl, TM landmarks normal, Nose: no deformity or discharge but congested;face  minimally tender Mouth : OP clear without lesion or edema . Neck: Supple without adenopathy or masses or bruits Chest:  Clear to A&P without wheezes rales or rhonchi CV:  S1-S2 no gallops or murmurs peripheral perfusion is normal Skin :nl perfusion and no acute rashes    ASSESSMENT AND PLAN:  Discussed the following assessment and plan:  Viral respiratory infection - Seems uncomplicated influenza in the community expectant management follow-up alarm symptoms relapsing etc. Rest and fluids keep [place] informed.  -Patient advised to return or notify health care team  if symptoms worsen ,persist or new concerns arise.  Patient Instructions  I think this is a viral respiratory infection  That should cure itself  And no signs of pneumonia .   YOu may cough for another week  Or so but  Continue to  feeling better  As the week goes on .  Rest fluids and   meds for comfort ( not cure)    as tolerated .   If   You get fever chills  And relapsing sx   After  getting better  Contact us  Concern about other infections   Can try  Tessalon perles   To try for help with cough .Marland Kitchen But not curative .   Hot fluids  Humidified air can help soothe the airway .        Standley Brooking. Daiton Cowles M.D.

## 2016-03-02 ENCOUNTER — Encounter: Payer: Self-pay | Admitting: Internal Medicine

## 2016-03-02 DIAGNOSIS — M8589 Other specified disorders of bone density and structure, multiple sites: Secondary | ICD-10-CM | POA: Diagnosis not present

## 2016-08-04 DIAGNOSIS — R609 Edema, unspecified: Secondary | ICD-10-CM | POA: Diagnosis not present

## 2016-08-06 ENCOUNTER — Other Ambulatory Visit: Payer: Self-pay | Admitting: Orthopedic Surgery

## 2016-08-06 DIAGNOSIS — M7989 Other specified soft tissue disorders: Secondary | ICD-10-CM

## 2016-08-10 ENCOUNTER — Other Ambulatory Visit: Payer: Self-pay

## 2016-08-11 ENCOUNTER — Ambulatory Visit
Admission: RE | Admit: 2016-08-11 | Discharge: 2016-08-11 | Disposition: A | Discharge status: Home / Self Care | Payer: Medicare Other | Source: Ambulatory Visit | Attending: Orthopedic Surgery | Admitting: Orthopedic Surgery

## 2016-08-11 DIAGNOSIS — M7989 Other specified soft tissue disorders: Secondary | ICD-10-CM | POA: Diagnosis not present

## 2016-08-23 DIAGNOSIS — R609 Edema, unspecified: Secondary | ICD-10-CM | POA: Diagnosis not present

## 2016-08-29 ENCOUNTER — Other Ambulatory Visit: Payer: Self-pay | Admitting: Internal Medicine

## 2016-08-31 NOTE — Telephone Encounter (Signed)
Sent to the pharmacy by e-scribe.  Pt has upcoming yearly with Dr. Regis Bill on 09/08/16.

## 2016-09-08 ENCOUNTER — Ambulatory Visit (INDEPENDENT_AMBULATORY_CARE_PROVIDER_SITE_OTHER): Payer: Medicare Other | Admitting: Internal Medicine

## 2016-09-08 ENCOUNTER — Encounter: Payer: Self-pay | Admitting: Internal Medicine

## 2016-09-08 VITALS — BP 100/70 | HR 89 | Temp 98.3°F | Ht 65.0 in | Wt 155.4 lb

## 2016-09-08 DIAGNOSIS — Z79890 Hormone replacement therapy (postmenopausal): Secondary | ICD-10-CM | POA: Diagnosis not present

## 2016-09-08 DIAGNOSIS — E785 Hyperlipidemia, unspecified: Secondary | ICD-10-CM | POA: Diagnosis not present

## 2016-09-08 DIAGNOSIS — E2839 Other primary ovarian failure: Secondary | ICD-10-CM | POA: Diagnosis not present

## 2016-09-08 DIAGNOSIS — Z79899 Other long term (current) drug therapy: Secondary | ICD-10-CM | POA: Diagnosis not present

## 2016-09-08 MED ORDER — ZOSTER VAC RECOMB ADJUVANTED 50 MCG/0.5ML IM SUSR: 0.5000 mL | Freq: Once | INTRAMUSCULAR | 1 refills | Status: AC

## 2016-09-08 NOTE — Patient Instructions (Addendum)
Get fasting labs   As discussed   To check cholesterol .   I fdont wat to take statin then zetia has been recommended for help .  Continue to weane the prm pro  ? 1/2 pill.    Ok to stay on the sertraline.   Can get    shingrix    Vaccine .      Preventive Care 1 Years and Older, Female Preventive care refers to lifestyle choices and visits with your health care provider that can promote health and wellness. What does preventive care include?  A yearly physical exam. This is also called an annual well check.  Dental exams once or twice a year.  Routine eye exams. Ask your health care provider how often you should have your eyes checked.  Personal lifestyle choices, including: ? Daily care of your teeth and gums. ? Regular physical activity. ? Eating a healthy diet. ? Avoiding tobacco and drug use. ? Limiting alcohol use. ? Practicing safe sex. ? Taking low-dose aspirin every day. ? Taking vitamin and mineral supplements as recommended by your health care provider. What happens during an annual well check? The services and screenings done by your health care provider during your annual well check will depend on your age, overall health, lifestyle risk factors, and family history of disease. Counseling Your health care provider may ask you questions about your:  Alcohol use.  Tobacco use.  Drug use.  Emotional well-being.  Home and relationship well-being.  Sexual activity.  Eating habits.  History of falls.  Memory and ability to understand (cognition).  Work and work Statistician.  Reproductive health.  Screening You may have the following tests or measurements:  Height, weight, and BMI.  Blood pressure.  Lipid and cholesterol levels. These may be checked every 5 years, or more frequently if you are over 34 years old.  Skin check.  Lung cancer screening. You may have this screening every year starting at age 73 if you have a 30-pack-year history of  smoking and currently smoke or have quit within the past 15 years.  Fecal occult blood test (FOBT) of the stool. You may have this test every year starting at age 42.  Flexible sigmoidoscopy or colonoscopy. You may have a sigmoidoscopy every 5 years or a colonoscopy every 10 years starting at age 54.  Hepatitis C blood test.  Hepatitis B blood test.  Sexually transmitted disease (STD) testing.  Diabetes screening. This is done by checking your blood sugar (glucose) after you have not eaten for a while (fasting). You may have this done every 1-3 years.  Bone density scan. This is done to screen for osteoporosis. You may have this done starting at age 37.  Mammogram. This may be done every 1-2 years. Talk to your health care provider about how often you should have regular mammograms.  Talk with your health care provider about your test results, treatment options, and if necessary, the need for more tests. Vaccines Your health care provider may recommend certain vaccines, such as:  Influenza vaccine. This is recommended every year.  Tetanus, diphtheria, and acellular pertussis (Tdap, Td) vaccine. You may need a Td booster every 10 years.  Varicella vaccine. You may need this if you have not been vaccinated.  Zoster vaccine. You may need this after age 16.  Measles, mumps, and rubella (MMR) vaccine. You may need at least one dose of MMR if you were born in 1957 or later. You may also need a  second dose.  Pneumococcal 13-valent conjugate (PCV13) vaccine. One dose is recommended after age 13.  Pneumococcal polysaccharide (PPSV23) vaccine. One dose is recommended after age 72.  Meningococcal vaccine. You may need this if you have certain conditions.  Hepatitis A vaccine. You may need this if you have certain conditions or if you travel or work in places where you may be exposed to hepatitis A.  Hepatitis B vaccine. You may need this if you have certain conditions or if you travel or  work in places where you may be exposed to hepatitis B.  Haemophilus influenzae type b (Hib) vaccine. You may need this if you have certain conditions.  Talk to your health care provider about which screenings and vaccines you need and how often you need them. This information is not intended to replace advice given to you by your health care provider. Make sure you discuss any questions you have with your health care provider. Document Released: 01/31/2015 Document Revised: 09/24/2015 Document Reviewed: 11/05/2014 Elsevier Interactive Patient Education  2017 [employer].      Why follow it? Research shows. . Those who follow the Mediterranean diet have a reduced risk of heart disease  . The diet is associated with a reduced incidence of Parkinson's and Alzheimer's diseases . People following the diet may have longer life expectancies and lower rates of chronic diseases  . The Dietary Guidelines for Americans recommends the Mediterranean diet as an eating plan to promote health and prevent disease  What Is the Mediterranean Diet?  . Healthy eating plan based on typical foods and recipes of Mediterranean-style cooking . The diet is primarily a plant based diet; these foods should make up a majority of meals   Starches - Plant based foods should make up a majority of meals - They are an important sources of vitamins, minerals, energy, antioxidants, and fiber - Choose whole grains, foods high in fiber and minimally processed items  - Typical grain sources include wheat, oats, barley, corn, brown rice, bulgar, farro, millet, polenta, couscous  - Various types of beans include chickpeas, lentils, fava beans, black beans, white beans   Fruits  Veggies - Large quantities of antioxidant rich fruits & veggies; 6 or more servings  - Vegetables can be eaten raw or lightly drizzled with oil and cooked  - Vegetables common to the traditional Mediterranean Diet include: artichokes, arugula, beets,  broccoli, brussel sprouts, cabbage, carrots, celery, collard greens, cucumbers, eggplant, kale, leeks, lemons, lettuce, mushrooms, okra, onions, peas, peppers, potatoes, pumpkin, radishes, rutabaga, shallots, spinach, sweet potatoes, turnips, zucchini - Fruits common to the Mediterranean Diet include: apples, apricots, avocados, cherries, clementines, dates, figs, grapefruits, grapes, melons, nectarines, oranges, peaches, pears, pomegranates, strawberries, tangerines  Fats - Replace butter and margarine with healthy oils, such as olive oil, canola oil, and tahini  - Limit nuts to no more than a handful a day  - Nuts include walnuts, almonds, pecans, pistachios, pine nuts  - Limit or avoid candied, honey roasted or heavily salted nuts - Olives are central to the [employer] - can be eaten whole or used in a variety of dishes   Meats Protein - Limiting red meat: no more than a few times a month - When eating red meat: choose lean cuts and keep the portion to the size of deck of cards - Eggs: approx. 0 to 4 times a week  - Fish and lean poultry: at least 2 a week  - Healthy protein sources include, chicken, [place],  lean beef, lamb - Increase intake of seafood such as tuna, salmon, trout, mackerel, shrimp, scallops - Avoid or limit high fat processed meats such as sausage and bacon  Dairy - Include moderate amounts of low fat dairy products  - Focus on healthy dairy such as fat free yogurt, skim milk, low or reduced fat cheese - Limit dairy products higher in fat such as whole or 2% milk, cheese, ice cream  Alcohol - Moderate amounts of red wine is ok  - No more than 5 oz daily for women (all ages) and men older than age 28  - No more than 10 oz of wine daily for men younger than 16  Other - Limit sweets and other desserts  - Use herbs and spices instead of salt to flavor foods  - Herbs and spices common to the traditional Mediterranean Diet include: basil, bay leaves, chives, cloves, cumin,  fennel, garlic, lavender, marjoram, mint, oregano, parsley, pepper, rosemary, sage, savory, sumac, tarragon, thyme   It's not just a diet, it's a lifestyle:  . The Mediterranean diet includes lifestyle factors typical of those in the region  . Foods, drinks and meals are best eaten with others and savored . Daily physical activity is important for overall good health . This could be strenuous exercise like running and aerobics . This could also be more leisurely activities such as walking, housework, yard-work, or taking the stairs . Moderation is the key; a balanced and healthy diet accommodates most foods and drinks . Consider portion sizes and frequency of consumption of certain foods   Meal Ideas & Options:  . Breakfast:  o Whole wheat toast or whole wheat English muffins with peanut butter & hard boiled egg o Steel cut oats topped with apples & cinnamon and skim milk  o Fresh fruit: banana, strawberries, melon, berries, peaches  o Smoothies: strawberries, bananas, greek yogurt, peanut butter o Low fat greek yogurt with blueberries and granola  o Egg white omelet with spinach and mushrooms o Breakfast couscous: whole wheat couscous, apricots, skim milk, cranberries  . Sandwiches:  o Hummus and grilled vegetables (peppers, zucchini, squash) on whole wheat bread   o Grilled chicken on whole wheat pita with lettuce, tomatoes, cucumbers or tzatziki  o Tuna salad on whole wheat bread: tuna salad made with greek yogurt, olives, red peppers, capers, green onions o Garlic rosemary lamb pita: lamb sauted with garlic, rosemary, salt & pepper; add lettuce, cucumber, greek yogurt to pita - flavor with lemon juice and black pepper  . Seafood:  o Mediterranean grilled salmon, seasoned with garlic, basil, parsley, lemon juice and black pepper o Shrimp, lemon, and spinach whole-grain pasta salad made with low fat greek yogurt  o Seared scallops with lemon orzo  o Seared tuna steaks seasoned salt,  pepper, coriander topped with tomato mixture of olives, tomatoes, olive oil, minced garlic, parsley, green onions and cappers  . Meats:  o Herbed greek chicken salad with kalamata olives, cucumber, feta  o Red bell peppers stuffed with spinach, bulgur, lean ground beef (or lentils) & topped with feta   o Kebabs: skewers of chicken, tomatoes, onions, zucchini, squash  o [place] burgers: made with red onions, mint, dill, lemon juice, feta cheese topped with roasted red peppers . Vegetarian o Cucumber salad: cucumbers, artichoke hearts, celery, red onion, feta cheese, tossed in olive oil & lemon juice  o Hummus and whole grain pita points with a greek salad (lettuce, tomato, feta, olives, cucumbers, red onion) o  Lentil soup with celery, carrots made with vegetable broth, garlic, salt and pepper  o Tabouli salad: parsley, bulgur, mint, scallions, cucumbers, tomato, radishes, lemon juice, olive oil, salt and pepper.

## 2016-09-08 NOTE — Progress Notes (Signed)
Chief Complaint  Patient presents with  . Annual Exam    HPI: Summer Collins 76 y.o. come in for Chronic disease management  Medicare yearly visit  On ht about 4 days per week  Trying to avoid hot sweats  Doesn't want to take statin cause   Know people with side effects .    hh of 2 no pets .   Tobacco  etoh per week 5-6  Exercise  Not now  Sleep  [facility] up.  No sugar  drings    Hard boiled egg and toast. This am nf for this visit  utd on colon cancer screen  To get mammo  Sertraline help ful for her mood and takes 1/2 per day and increase in x mas season   Wants to continue..  ROS: See pertinent positives and negatives per HPI.   Had episode of dizziness when got up one day and lasted almost to lunch no falling no assoc sx ? Vertigo  baance never that good doing yoga no baseline changes   Past Medical History:  Diagnosis Date  . Anxiety     on low dose zoloft for a while  . Hyperlipidemia   . Osteopenia   . Vitamin D deficiency     Family History  Problem Relation Age of Onset  . Cancer Father        prostate and spread    Social History   Social History  . Marital status: Married    Spouse name: N/A  . Number of children: N/A  . Years of education: N/A   Social History Main Topics  . Smoking status: Former Research scientist (life sciences)  . Smokeless tobacco: Never Used  . Alcohol use 0.5 oz/week    1 Standard drinks or equivalent per week  . Drug use: No  . Sexual activity: Not Asked   Other Topics Concern  . 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                Outpatient Medications Prior to Visit  Medication Sig Dispense Refill  . Calcium Carbonate-Vitamin D (CALCIUM 600 + D PO) Take by mouth. 1 bid       . Cholecalciferol (VITAMIN D3) 1000 UNITS CAPS Take by mouth.    . Cyanocobalamin  (VITAMIN B 12 PO) Take by mouth. 2547mg daily    . estrogen, conjugated,-medroxyprogesterone (PREMPRO) 0.3-1.5 MG per tablet TAKE 1 TABLET DAILY, 6 DAYS OUT OF THE WEEK    . Glucos-MSM-C-Mn-Ginger-Willow (GLUCOSAMINE MSM COMPLEX PO) Take by mouth.    . Omega-3 Fatty Acids (FISH OIL) 1000 MG CAPS Take by mouth.    .Marland KitchenPREMPRO 0.3-1.5 MG tablet TAKE ONE TABLET 5 DAYS OUT OF THE WEEK, WEAN AS DIRECTED 56 tablet 0  . sertraline (ZOLOFT) 50 MG tablet TAKE 1 TABLET DAILY 90 tablet 2  . benzonatate (TESSALON) 100 MG capsule Take 1 capsule (100 mg total) by mouth 3 (three) times daily as needed for cough. (Patient not taking: Reported on 09/08/2016) 21 capsule 0   No facility-administered medications prior to visit.      EXAM:  BP 100/70 (BP Location: Right Arm, Patient Position: Sitting, Cuff Size: Normal)  Pulse 89   Temp 98.3 F (36.8 C) (Oral)   Ht '5\' 5"'  (1.651 m)   Wt 155 lb 6.4 oz (70.5 kg)   BMI 25.86 kg/m   Body mass index is 25.86 kg/m. Physical Exam: Vital signs reviewed ZOX:WRUE is a well-developed well-nourished alert cooperative  female who appears her stated age in no acute distress.  HEENT: normocephalic atraumatic , Eyes: PERRL EOM's full, conjunctiva clear, Nares: paten,t no deformity discharge or tenderness., Ears: no deformity EAC's clear TMs with normal landmarks. Mouth: clear OP, no lesions, edema.  Moist mucous membranes. Dentition in adequate repair. NECK: supple without masses, thyromegaly or bruits. CHEST/PULM:  Clear to auscultation and percussion breath sounds equal no wheeze , rales or rhonchi. No chest wall deformities or tenderness. CV: PMI is nondisplaced, S1 S2 no gallops, murmurs, rubs. Peripheral pulses are full without delay.No JVD . Breast: normal by inspection . No dimpling, discharge, masses, tenderness or discharge . ABDOMEN: Bowel sounds normal nontender  No guard or rebound, no hepato splenomegal no CVA tenderness.  Extremtities:  No clubbing cyanosis  or edema, no acute joint swelling or redness no focal atrophy NEURO:  Oriented x3, cranial nerves 3-12 appear to be intact, no obvious focal weakness,gait within normal limits no abnormal reflexes or asymmetrical SKIN: No acute rashes normal turgor, color, no bruising or petechiae. Sun  Changes  PSYCH: Oriented, good eye contact, no obvious depression anxiety, cognition and judgment appear normal. LN: no cervical axillary iadenopathy    Lab Results  Component Value Date   WBC 5.5 06/11/2015   HGB 13.4 06/11/2015   HCT 39.9 06/11/2015   PLT 264.0 06/11/2015   GLUCOSE 90 06/11/2015   CHOL 222 (H) 06/11/2015   TRIG 197.0 (H) 06/11/2015   HDL 35.10 (L) 06/11/2015   LDLCALC 148 (H) 06/11/2015   ALT 15 06/11/2015   AST 15 06/11/2015   NA 141 06/11/2015   K 4.6 06/11/2015   CL 106 06/11/2015   CREATININE 0.68 06/11/2015   BUN 17 06/11/2015   CO2 29 06/11/2015   TSH 2.53 06/11/2015   HGBA1C 6.1 06/11/2015   BP Readings from Last 3 Encounters:  09/08/16 100/70  02/24/16 126/74  06/11/15 130/82    ASSESSMENT AND PLAN:  Discussed the following assessment and plan:  Hyperlipidemia, unspecified hyperlipidemia type - Plan: Basic metabolic panel, Lipid panel, Hepatic function panel  Medication management - continue sertraline   - Plan: Basic metabolic panel, Lipid panel, Hepatic function panel  Hormone replacement therapy (postmenopausal) - dsic dv cancer  risk and advise wean as discussed  - Plan: Basic metabolic panel, Lipid panel, Hepatic function panel  Estrogen deficiency Decline statin  Today  Concern about side effects  Fastin lab appt  Orders placed  Total visit 67mns > 50% spent counseling and coordinating care as indicated in above note and in instructions to patient .   The 10-year ASCVD risk score (Mikey BussingDC JBrooke Bonito, et al., 2013) is: 11.2%   Values used to calculate the score:     Age: [age]years     Sex: Female     Is Non-Hispanic African American: No     Diabetic: No      Tobacco smoker: No     Systolic Blood Pressure: 1454mmHg     Is BP treated: No     HDL Cholesterol: 35.1 mg/dL     Total Cholesterol: 222 mg/dL  -Patient advised to return or notify health care team  if  new concerns  arise.  Patient Instructions   Get fasting labs   As discussed   To check cholesterol .   I fdont wat to take statin then zetia has been recommended for help .  Continue to weane the prm pro  ? 1/2 pill.    Ok to stay on the sertraline.   Can get    shingrix    Vaccine .      Preventive Care 42 Years and Older, Female Preventive care refers to lifestyle choices and visits with your health care provider that can promote health and wellness. What does preventive care include?  A yearly physical exam. This is also called an annual well check.  Dental exams once or twice a year.  Routine eye exams. Ask your health care provider how often you should have your eyes checked.  Personal lifestyle choices, including: ? Daily care of your teeth and gums. ? Regular physical activity. ? Eating a healthy diet. ? Avoiding tobacco and drug use. ? Limiting alcohol use. ? Practicing safe sex. ? Taking low-dose aspirin every day. ? Taking vitamin and mineral supplements as recommended by your health care provider. What happens during an annual well check? The services and screenings done by your health care provider during your annual well check will depend on your age, overall health, lifestyle risk factors, and family history of disease. Counseling Your health care provider may ask you questions about your:  Alcohol use.  Tobacco use.  Drug use.  Emotional well-being.  Home and relationship well-being.  Sexual activity.  Eating habits.  History of falls.  Memory and ability to understand (cognition).  Work and work Statistician.  Reproductive health.  Screening You may have the following tests or measurements:  Height, weight, and BMI.  Blood  pressure.  Lipid and cholesterol levels. These may be checked every 5 years, or more frequently if you are over 53 years old.  Skin check.  Lung cancer screening. You may have this screening every year starting at age 19 if you have a 30-pack-year history of smoking and currently smoke or have quit within the past 15 years.  Fecal occult blood test (FOBT) of the stool. You may have this test every year starting at age 13.  Flexible sigmoidoscopy or colonoscopy. You may have a sigmoidoscopy every 5 years or a colonoscopy every 10 years starting at age 73.  Hepatitis C blood test.  Hepatitis B blood test.  Sexually transmitted disease (STD) testing.  Diabetes screening. This is done by checking your blood sugar (glucose) after you have not eaten for a while (fasting). You may have this done every 1-3 years.  Bone density scan. This is done to screen for osteoporosis. You may have this done starting at age 19.  Mammogram. This may be done every 1-2 years. Talk to your health care provider about how often you should have regular mammograms.  Talk with your health care provider about your test results, treatment options, and if necessary, the need for more tests. Vaccines Your health care provider may recommend certain vaccines, such as:  Influenza vaccine. This is recommended every year.  Tetanus, diphtheria, and acellular pertussis (Tdap, Td) vaccine. You may need a Td booster every 10 years.  Varicella vaccine. You may need this if you have not been vaccinated.  Zoster vaccine. You may need this after age 81.  Measles, mumps, and rubella (MMR) vaccine. You may need at least one dose of MMR if you were born in 1957 or  later. You may also need a second dose.  Pneumococcal 13-valent conjugate (PCV13) vaccine. One dose is recommended after age 48.  Pneumococcal polysaccharide (PPSV23) vaccine. One dose is recommended after age 28.  Meningococcal vaccine. You may need this if you  have certain conditions.  Hepatitis A vaccine. You may need this if you have certain conditions or if you travel or work in places where you may be exposed to hepatitis A.  Hepatitis B vaccine. You may need this if you have certain conditions or if you travel or work in places where you may be exposed to hepatitis B.  Haemophilus influenzae type b (Hib) vaccine. You may need this if you have certain conditions.  Talk to your health care provider about which screenings and vaccines you need and how often you need them. This information is not intended to replace advice given to you by your health care provider. Make sure you discuss any questions you have with your health care provider. Document Released: 01/31/2015 Document Revised: 09/24/2015 Document Reviewed: 11/05/2014 Elsevier Interactive Patient Education  2017 [employer].      Why follow it? Research shows. . Those who follow the Mediterranean diet have a reduced risk of heart disease  . The diet is associated with a reduced incidence of Parkinson's and Alzheimer's diseases . People following the diet may have longer life expectancies and lower rates of chronic diseases  . The Dietary Guidelines for Americans recommends the Mediterranean diet as an eating plan to promote health and prevent disease  What Is the Mediterranean Diet?  . Healthy eating plan based on typical foods and recipes of Mediterranean-style cooking . The diet is primarily a plant based diet; these foods should make up a majority of meals   Starches - Plant based foods should make up a majority of meals - They are an important sources of vitamins, minerals, energy, antioxidants, and fiber - Choose whole grains, foods high in fiber and minimally processed items  - Typical grain sources include wheat, oats, barley, corn, brown rice, bulgar, farro, millet, polenta, couscous  - Various types of beans include chickpeas, lentils, fava beans, black beans, white beans    Fruits  Veggies - Large quantities of antioxidant rich fruits & veggies; 6 or more servings  - Vegetables can be eaten raw or lightly drizzled with oil and cooked  - Vegetables common to the traditional Mediterranean Diet include: artichokes, arugula, beets, broccoli, brussel sprouts, cabbage, carrots, celery, collard greens, cucumbers, eggplant, kale, leeks, lemons, lettuce, mushrooms, okra, onions, peas, peppers, potatoes, pumpkin, radishes, rutabaga, shallots, spinach, sweet potatoes, turnips, zucchini - Fruits common to the Mediterranean Diet include: apples, apricots, avocados, cherries, clementines, dates, figs, grapefruits, grapes, melons, nectarines, oranges, peaches, pears, pomegranates, strawberries, tangerines  Fats - Replace butter and margarine with healthy oils, such as olive oil, canola oil, and tahini  - Limit nuts to no more than a handful a day  - Nuts include walnuts, almonds, pecans, pistachios, pine nuts  - Limit or avoid candied, honey roasted or heavily salted nuts - Olives are central to the [employer] - can be eaten whole or used in a variety of dishes   Meats Protein - Limiting red meat: no more than a few times a month - When eating red meat: choose lean cuts and keep the portion to the size of deck of cards - Eggs: approx. 0 to 4 times a week  - Fish and lean poultry: at least 2 a week  -  Healthy protein sources include, chicken, [place], lean beef, lamb - Increase intake of seafood such as tuna, salmon, trout, mackerel, shrimp, scallops - Avoid or limit high fat processed meats such as sausage and bacon  Dairy - Include moderate amounts of low fat dairy products  - Focus on healthy dairy such as fat free yogurt, skim milk, low or reduced fat cheese - Limit dairy products higher in fat such as whole or 2% milk, cheese, ice cream  Alcohol - Moderate amounts of red wine is ok  - No more than 5 oz daily for women (all ages) and men older than age 11  - No more  than 10 oz of wine daily for men younger than 57  Other - Limit sweets and other desserts  - Use herbs and spices instead of salt to flavor foods  - Herbs and spices common to the traditional Mediterranean Diet include: basil, bay leaves, chives, cloves, cumin, fennel, garlic, lavender, marjoram, mint, oregano, parsley, pepper, rosemary, sage, savory, sumac, tarragon, thyme   It's not just a diet, it's a lifestyle:  . The Mediterranean diet includes lifestyle factors typical of those in the region  . Foods, drinks and meals are best eaten with others and savored . Daily physical activity is important for overall good health . This could be strenuous exercise like running and aerobics . This could also be more leisurely activities such as walking, housework, yard-work, or taking the stairs . Moderation is the key; a balanced and healthy diet accommodates most foods and drinks . Consider portion sizes and frequency of consumption of certain foods   Meal Ideas & Options:  . Breakfast:  o Whole wheat toast or whole wheat English muffins with peanut butter & hard boiled egg o Steel cut oats topped with apples & cinnamon and skim milk  o Fresh fruit: banana, strawberries, melon, berries, peaches  o Smoothies: strawberries, bananas, greek yogurt, peanut butter o Low fat greek yogurt with blueberries and granola  o Egg white omelet with spinach and mushrooms o Breakfast couscous: whole wheat couscous, apricots, skim milk, cranberries  . Sandwiches:  o Hummus and grilled vegetables (peppers, zucchini, squash) on whole wheat bread   o Grilled chicken on whole wheat pita with lettuce, tomatoes, cucumbers or tzatziki  o Tuna salad on whole wheat bread: tuna salad made with greek yogurt, olives, red peppers, capers, green onions o Garlic rosemary lamb pita: lamb sauted with garlic, rosemary, salt & pepper; add lettuce, cucumber, greek yogurt to pita - flavor with lemon juice and black pepper   . Seafood:  o Mediterranean grilled salmon, seasoned with garlic, basil, parsley, lemon juice and black pepper o Shrimp, lemon, and spinach whole-grain pasta salad made with low fat greek yogurt  o Seared scallops with lemon orzo  o Seared tuna steaks seasoned salt, pepper, coriander topped with tomato mixture of olives, tomatoes, olive oil, minced garlic, parsley, green onions and cappers  . Meats:  o Herbed greek chicken salad with kalamata olives, cucumber, feta  o Red bell peppers stuffed with spinach, bulgur, lean ground beef (or lentils) & topped with feta   o Kebabs: skewers of chicken, tomatoes, onions, zucchini, squash  o [place] burgers: made with red onions, mint, dill, lemon juice, feta cheese topped with roasted red peppers . Vegetarian o Cucumber salad: cucumbers, artichoke hearts, celery, red onion, feta cheese, tossed in olive oil & lemon juice  o Hummus and whole grain pita points with a greek salad (lettuce, tomato,  feta, olives, cucumbers, red onion) o Lentil soup with celery, carrots made with vegetable broth, garlic, salt and pepper  o Tabouli salad: parsley, bulgur, mint, scallions, cucumbers, tomato, radishes, lemon juice, olive oil, salt and pepper.          Standley Brooking. Amarian Botero M.D.

## 2016-09-13 ENCOUNTER — Other Ambulatory Visit (INDEPENDENT_AMBULATORY_CARE_PROVIDER_SITE_OTHER): Payer: Medicare Other

## 2016-09-13 DIAGNOSIS — E785 Hyperlipidemia, unspecified: Secondary | ICD-10-CM | POA: Diagnosis not present

## 2016-09-13 DIAGNOSIS — Z79890 Hormone replacement therapy (postmenopausal): Secondary | ICD-10-CM

## 2016-09-13 DIAGNOSIS — Z79899 Other long term (current) drug therapy: Secondary | ICD-10-CM

## 2016-09-13 LAB — LIPID PANEL
CHOL/HDL RATIO: 6
Cholesterol: 203 mg/dL — ABNORMAL HIGH (ref 0–200)
HDL: 33.2 mg/dL — AB (ref >39.00)
LDL Cholesterol: 134 mg/dL — ABNORMAL HIGH (ref 0–99)
NONHDL: 169.33
TRIGLYCERIDES: 177 mg/dL — AB (ref 0.0–149.0)
VLDL: 35.4 mg/dL (ref 0.0–40.0)

## 2016-09-13 LAB — HEPATIC FUNCTION PANEL
ALBUMIN: 4.2 g/dL (ref 3.5–5.2)
ALK PHOS: 39 U/L (ref 39–117)
ALT: 12 U/L (ref 0–35)
AST: 12 U/L (ref 0–37)
Bilirubin, Direct: 0.1 mg/dL (ref 0.0–0.3)
TOTAL PROTEIN: 6.9 g/dL (ref 6.0–8.3)
Total Bilirubin: 0.4 mg/dL (ref 0.2–1.2)

## 2016-09-13 LAB — BASIC METABOLIC PANEL
BUN: 18 mg/dL (ref 6–23)
CO2: 32 meq/L (ref 19–32)
Calcium: 9.5 mg/dL (ref 8.4–10.5)
Chloride: 105 mEq/L (ref 96–112)
Creatinine, Ser: 0.73 mg/dL (ref 0.40–1.20)
GFR: 82.37 mL/min (ref >60.00)
GLUCOSE: 102 mg/dL — AB (ref 70–99)
POTASSIUM: 4.2 meq/L (ref 3.5–5.1)
SODIUM: 140 meq/L (ref 135–145)

## 2016-09-17 ENCOUNTER — Telehealth: Payer: Self-pay | Admitting: Internal Medicine

## 2016-09-17 ENCOUNTER — Other Ambulatory Visit: Payer: Self-pay | Admitting: Emergency Medicine

## 2016-09-17 DIAGNOSIS — E785 Hyperlipidemia, unspecified: Secondary | ICD-10-CM

## 2016-09-17 MED ORDER — EZETIMIBE 10 MG PO TABS: 10.0000 mg | ORAL_TABLET | Freq: Every day | ORAL | 3 refills | Status: DC

## 2016-09-17 NOTE — Telephone Encounter (Signed)
pt returning your call about lab results She will be home all day today!

## 2016-09-17 NOTE — Telephone Encounter (Signed)
Spoke with patient regarding labs. Patient understood nothing further needed

## 2016-09-27 DIAGNOSIS — M65311 Trigger thumb, right thumb: Secondary | ICD-10-CM | POA: Diagnosis not present

## 2016-09-27 DIAGNOSIS — M79644 Pain in right finger(s): Secondary | ICD-10-CM | POA: Diagnosis not present

## 2016-09-29 DIAGNOSIS — L814 Other melanin hyperpigmentation: Secondary | ICD-10-CM | POA: Diagnosis not present

## 2016-09-29 DIAGNOSIS — L570 Actinic keratosis: Secondary | ICD-10-CM | POA: Diagnosis not present

## 2016-09-29 DIAGNOSIS — D225 Melanocytic nevi of trunk: Secondary | ICD-10-CM | POA: Diagnosis not present

## 2016-09-29 DIAGNOSIS — L821 Other seborrheic keratosis: Secondary | ICD-10-CM | POA: Diagnosis not present

## 2016-10-04 DIAGNOSIS — Z1231 Encounter for screening mammogram for malignant neoplasm of breast: Secondary | ICD-10-CM | POA: Diagnosis not present

## 2016-10-04 LAB — HM MAMMOGRAPHY

## 2016-10-06 ENCOUNTER — Encounter: Payer: Self-pay | Admitting: Internal Medicine

## 2016-10-08 ENCOUNTER — Encounter: Payer: Self-pay | Admitting: Internal Medicine

## 2016-10-25 DIAGNOSIS — M79644 Pain in right finger(s): Secondary | ICD-10-CM | POA: Diagnosis not present

## 2016-10-25 DIAGNOSIS — M65311 Trigger thumb, right thumb: Secondary | ICD-10-CM | POA: Diagnosis not present

## 2016-11-17 ENCOUNTER — Ambulatory Visit (INDEPENDENT_AMBULATORY_CARE_PROVIDER_SITE_OTHER): Payer: Medicare Other | Admitting: Internal Medicine

## 2016-11-17 ENCOUNTER — Encounter: Payer: Self-pay | Admitting: Internal Medicine

## 2016-11-17 VITALS — BP 108/68 | HR 70 | Temp 98.0°F | Wt 158.0 lb

## 2016-11-17 DIAGNOSIS — Z23 Encounter for immunization: Secondary | ICD-10-CM

## 2016-11-17 DIAGNOSIS — L309 Dermatitis, unspecified: Secondary | ICD-10-CM

## 2016-11-17 MED ORDER — TRIAMCINOLONE ACETONIDE 0.1 % EX CREA: 1.0000 "application " | TOPICAL_CREAM | Freq: Two times a day (BID) | CUTANEOUS | 0 refills | Status: DC

## 2016-11-17 NOTE — Patient Instructions (Signed)
This rash does not appear to be shingles. It acts like a contact dermatitis or something that got on your skin you are sensitive to. Do not use topical Benadryl Can use a moisturizer that you do not break out from and also am giving you a cortisone cream to put on the area twice a day until it fades to decrease itching. It may get better on its own with just a moisturizer and leaving it alone also.

## 2016-11-17 NOTE — Progress Notes (Signed)
Chief Complaint  Patient presents with  . Rash    Rash on neck started about 2 days ago. Rash is itchy, irritating and red. Pt put Benadryl cream on rash last night and itching stopped. Pt noted raised rash on left neck which spread under neck and her face turned red.     HPI: Summer Collins 76 y.o. come in for SDA onset about 2 days ago of some itchiness on the left side of her neck.  She noted it spreading up under the chin with some linear areas.  She thought it could be shingles but has no pain or stinging or vesicles.  She used some topical over-the-counter Benadryl one time. She is known to have sensitive skin and rosacea.  Is careful with her makeup. Called her dermatology office and they suggested she see PCP if she thought it was shingles. No fever. ROS: See pertinent positives and negatives per HPI.  No fever prodrome or neuralgia.  Past Medical History:  Diagnosis Date  . Anxiety     on low dose zoloft for a while  . Hyperlipidemia   . Osteopenia   . Vitamin D deficiency     Family History  Problem Relation Age of Onset  . Cancer Father        prostate and spread    Social History   Social History  . Marital status: Married    Spouse name: N/A  . Number of children: N/A  . Years of education: N/A   Social History Main Topics  . Smoking status: Former Research scientist (life sciences)  . Smokeless tobacco: Never Used  . Alcohol use 0.5 oz/week    1 Standard drinks or equivalent per week  . Drug use: No  . Sexual activity: Not Asked   Other Topics Concern  . 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                Outpatient Medications Prior to Visit  Medication Sig Dispense Refill  . benzonatate (TESSALON) 100 MG capsule Take 1 capsule (100 mg total) by mouth 3 (three) times daily as needed for  cough. 21 capsule 0  . Calcium Carbonate-Vitamin D (CALCIUM 600 + D PO) Take by mouth. 1 bid       . Cholecalciferol (VITAMIN D3) 1000 UNITS CAPS Take by mouth.    . Cyanocobalamin (VITAMIN B 12 PO) Take by mouth. 2575mcg daily    . estrogen, conjugated,-medroxyprogesterone (PREMPRO) 0.3-1.5 MG per tablet TAKE 1 TABLET DAILY, 6 DAYS OUT OF THE WEEK    . ezetimibe (ZETIA) 10 MG tablet Take 1 tablet (10 mg total) by mouth daily. 30 tablet 3  . Glucos-MSM-C-Mn-Ginger-Willow (GLUCOSAMINE MSM COMPLEX PO) Take by mouth.    . Omega-3 Fatty Acids (FISH OIL) 1000 MG CAPS Take by mouth.    Marland Kitchen PREMPRO 0.3-1.5 MG tablet TAKE ONE TABLET 5 DAYS OUT OF THE WEEK, WEAN AS DIRECTED 56 tablet 0  . sertraline (ZOLOFT) 50 MG tablet TAKE 1 TABLET DAILY 90 tablet 2   No facility-administered medications prior to visit.      EXAM:  BP 108/68 (BP Location: Right Arm, Patient Position: Sitting, Cuff Size: Normal)   Pulse  70   Temp 98 F (36.7 C) (Oral)   Wt 158 lb (71.7 kg)   BMI 26.29 kg/m   Body mass index is 26.29 kg/m.  GENERAL: vitals reviewed and listed above, alert, oriented, appears well hydrated and in no acute distress HEENT: atraumatic, conjunctiva  clear, no obvious abnormalities on inspection of external nose and ears  Skin shows diffuse erythema left neck area up under her chin without vesicles or pustules nor dermatomal distribution.  Area feels somewhat rough and confluent.  PSYCH: pleasant and cooperative, no obvious depression or anxiety Lab Results  Component Value Date   WBC 5.5 06/11/2015   HGB 13.4 06/11/2015   HCT 39.9 06/11/2015   PLT 264.0 06/11/2015   GLUCOSE 102 (H) 09/13/2016   CHOL 203 (H) 09/13/2016   TRIG 177.0 (H) 09/13/2016   HDL 33.20 (L) 09/13/2016   LDLCALC 134 (H) 09/13/2016   ALT 12 09/13/2016   AST 12 09/13/2016   NA 140 09/13/2016   K 4.2 09/13/2016   CL 105 09/13/2016   CREATININE 0.73 09/13/2016   BUN 18 09/13/2016   CO2 32 09/13/2016   TSH 2.53  06/11/2015   HGBA1C 6.1 06/11/2015   BP Readings from Last 3 Encounters:  11/17/16 108/68  09/08/16 100/70  02/24/16 126/74    ASSESSMENT AND PLAN:  Discussed the following assessment and plan:  Dermatitis itchy  Need for influenza vaccination - Plan: Flu vaccine HIGH DOSE PF (Fluzone High dose) Acts more like contact dermatitis and no evidence of shingles expectant management contact us as directed and/or follow-up with dermatology. Advised less is best but can add topical cortisone see instructions. -Patient advised to return or notify health care team  if  new concerns arise.  Patient Instructions  This rash does not appear to be shingles. It acts like a contact dermatitis or something that got on your skin you are sensitive to. Do not use topical Benadryl Can use a moisturizer that you do not break out from and also am giving you a cortisone cream to put on the area twice a day until it fades to decrease itching. It may get better on its own with just a moisturizer and leaving it alone also.     Standley Brooking. Betania Dizon M.D.

## 2016-12-06 DIAGNOSIS — M65311 Trigger thumb, right thumb: Secondary | ICD-10-CM | POA: Diagnosis not present

## 2017-01-19 ENCOUNTER — Other Ambulatory Visit: Payer: Self-pay | Admitting: Internal Medicine

## 2017-01-31 DIAGNOSIS — L820 Inflamed seborrheic keratosis: Secondary | ICD-10-CM | POA: Diagnosis not present

## 2017-03-23 DIAGNOSIS — M65342 Trigger finger, left ring finger: Secondary | ICD-10-CM | POA: Diagnosis not present

## 2017-05-11 DIAGNOSIS — M65342 Trigger finger, left ring finger: Secondary | ICD-10-CM | POA: Diagnosis not present

## 2017-06-21 DIAGNOSIS — J069 Acute upper respiratory infection, unspecified: Secondary | ICD-10-CM | POA: Diagnosis not present

## 2017-06-25 ENCOUNTER — Other Ambulatory Visit: Payer: Self-pay | Admitting: Internal Medicine

## 2017-06-27 DIAGNOSIS — M65342 Trigger finger, left ring finger: Secondary | ICD-10-CM | POA: Diagnosis not present

## 2017-06-29 NOTE — Telephone Encounter (Signed)
Have patient make appt for yearly visit  Before this runs out  One refill sent in today

## 2017-06-29 NOTE — Telephone Encounter (Signed)
Last OV on 11/17/2016

## 2017-09-05 DIAGNOSIS — L821 Other seborrheic keratosis: Secondary | ICD-10-CM | POA: Diagnosis not present

## 2017-09-05 DIAGNOSIS — Z85828 Personal history of other malignant neoplasm of skin: Secondary | ICD-10-CM | POA: Diagnosis not present

## 2017-09-05 DIAGNOSIS — L853 Xerosis cutis: Secondary | ICD-10-CM | POA: Diagnosis not present

## 2017-09-17 ENCOUNTER — Other Ambulatory Visit: Payer: Self-pay | Admitting: Internal Medicine

## 2017-10-10 DIAGNOSIS — Z1231 Encounter for screening mammogram for malignant neoplasm of breast: Secondary | ICD-10-CM | POA: Diagnosis not present

## 2017-10-10 LAB — HM MAMMOGRAPHY

## 2017-10-24 ENCOUNTER — Encounter: Payer: Self-pay | Admitting: Internal Medicine

## 2017-11-29 ENCOUNTER — Telehealth: Payer: Self-pay | Admitting: Internal Medicine

## 2017-11-29 NOTE — Telephone Encounter (Signed)
Last rx'd 07/21/2015 #90 x 1 refill. Last AWV 2017 Acute visits and disease management visits in between.   Please advise if okay to start prescribing again. Thanks.

## 2017-11-29 NOTE — Telephone Encounter (Signed)
Needs OV  As noted in last refill request Have her make yearly OV    And then refill med   For 30 days or  until  Comes in .

## 2017-11-29 NOTE — Telephone Encounter (Signed)
Copied from [place] [id number]. Topic: General - Other >> Nov 29, 2017  1:14 PM Lennox Solders wrote: Reason for CRM: [facility] and [facility] need new rx setraline hcl 50 mg tablets #90 w[facility] number [id number]. They received a blank fax. Please e-scribe or fax to [phone]

## 2017-11-30 MED ORDER — SERTRALINE HCL 50 MG PO TABS: 50.0000 mg | ORAL_TABLET | Freq: Every day | ORAL | 0 refills | Status: DC

## 2017-11-30 NOTE — Telephone Encounter (Signed)
Pt scheduled for CPE 01/23/2018 at 10:30am Rx sent to pharmacy. Nothing further needed.

## 2018-01-20 NOTE — Progress Notes (Signed)
 Chief Complaint  Patient presents with  . Annual Exam    Pt is concerned about being out of breathe and wants a stress test    HPI:  Patient  Summer Collins  78 y.o. comes in today foryearly visit but concerned about feeling more breathless for about the last year or so  No cp and no event tha relatd to sx.  Out of breath   Up and down steps  . Over a year.    Notices after x mas       More noticeable   No cholesterol medicine    Buying .  otc . Used to do lovaza .  Asks about other wil not take a statin    Never took  zetia that is on the med list.  takng HRT and only 4 days a week   For flushes   Sertraline taking 1/2  And doing ok   Feels stiff in am and hard to roll over  but does ok after arising   Health Maintenance  Topic Date Due  . PNA vac Low Risk Adult (2 of 2 - PPSV23) 12/18/2013  . MAMMOGRAM  10/11/2018  . TETANUS/TDAP  07/26/2019  . INFLUENZA VACCINE  Completed  . DEXA SCAN  Completed   Health Maintenance Review LIFESTYLE:  Exercise:  Normal activity no exercise  Tobacco/ETS:  No   Years ago   Closet tobacco. 25 year  Alcohol:  Out to dinner.  4 or less  Sugar beverages: Sleep: better after x mas.   Drug use: no HH of  2  No  Grand dog    ROS:  GEN/ HEENT: No fever, significant weight changes sweats headaches vision problems hearing changes, CV/ PULM; No chest pain , syncope,edemasee hpi  GI /GU: No adominal pain, vomiting, change in bowel habits. No blood in the stool. No significant GU symptoms. SKIN/HEME: ,no acute skin rashes suspicious lesions or bleeding. No lymphadenopathy, nodules, masses.  NEURO/ PSYCH:  No neurologic signs such as weakness numbness. No depression anxiety. IMM/ Allergy: No unusual infections.  Allergy .   REST of 12 system review negative except as per HPI   Past Medical History:  Diagnosis Date  . Anxiety     on low dose zoloft for a while  . Hyperlipidemia   . Osteopenia   . Vitamin D deficiency     Past  Surgical History:  Procedure Laterality Date  . CARPAL TUNNEL RELEASE    . DILATION AND CURETTAGE OF UTERUS     x 3    Family History  Problem Relation Age of Onset  . Cancer Father        prostate and spread    Social History   Socioeconomic History  . Marital status: Married    Spouse name: Not on file  . Number of children: Not on file  . Years of education: Not on file  . Highest education level: Not on file  Occupational History  . Not on file  Social Needs  . Financial resource strain: Not on file  . Food insecurity:    Worry: Not on file    Inability: Not on file  . Transportation needs:    Medical: Not on file    Non-medical: Not on file  Tobacco Use  . Smoking status: Former Smoker  . Smokeless tobacco: Never Used  Substance and Sexual Activity  . Alcohol use: Yes    Alcohol/week: 1.0 standard drinks      Types: 1 Standard drinks or equivalent per week  . Drug use: No  . Sexual activity: Not on file  Lifestyle  . Physical activity:    Days per week: Not on file    Minutes per session: Not on file  . Stress: Not on file  Relationships  . Social connections:    Talks on phone: Not on file    Gets together: Not on file    Attends religious service: Not on file    Active member of club or organization: Not on file    Attends meetings of clubs or organizations: Not on file    Relationship status: Not on file  Other Topics Concern  . Not on file  Social History Narrative   Married   hh of 2  No pets    2 children and grandchildren   ai   In [place] since [age]   G4 P2  2 miscar   BS degree Home economics form [employer]   retired  Currently in sales.                Outpatient Medications Prior to Visit  Medication Sig Dispense Refill  . benzonatate (TESSALON) 100 MG capsule Take 1 capsule (100 mg total) by mouth 3 (three) times daily as needed for cough. 21 capsule 0  . Calcium Carbonate-Vitamin D (CALCIUM 600 + D PO) Take by mouth. 1 bid       .  Cholecalciferol (VITAMIN D3) 1000 UNITS CAPS Take by mouth.    . Cyanocobalamin (VITAMIN B 12 PO) Take by mouth. 2500mcg daily    . estrogen, conjugated,-medroxyprogesterone (PREMPRO) 0.3-1.5 MG per tablet TAKE 1 TABLET DAILY, 6 DAYS OUT OF THE WEEK    . Glucos-MSM-C-Mn-Ginger-Willow (GLUCOSAMINE MSM COMPLEX PO) Take by mouth.    . Omega-3 Fatty Acids (FISH OIL) 1000 MG CAPS Take by mouth.    . triamcinolone cream (KENALOG) 0.1 % Apply 1 application topically 2 (two) times daily. Not on face for itchy rash 30 g 0  . ezetimibe (ZETIA) 10 MG tablet Take 1 tablet (10 mg total) by mouth daily. 30 tablet 3  . PREMPRO 0.3-1.5 MG tablet TAKE 1 TABLET 5 DAYS OUT   OF THE WEEK , WEAN AS DIRECTED ( YEARLY OFFICE VISIT NEEDED BEFORE FURTHER REFILLS ) 56 tablet 0  . sertraline (ZOLOFT) 50 MG tablet Take 1 tablet (50 mg total) by mouth daily. 90 tablet 0   No facility-administered medications prior to visit.      EXAM:  BP 128/76 (BP Location: Right Arm, Patient Position: Sitting, Cuff Size: Normal)   Pulse 63   Temp 98.3 F (36.8 C) (Oral)   Ht 5' 4.75" (1.645 m)   Wt 160 lb 9.6 oz (72.8 kg)   BMI 26.93 kg/m   Body mass index is 26.93 kg/m. Wt Readings from Last 3 Encounters:  01/23/18 160 lb 9.6 oz (72.8 kg)  11/17/16 158 lb (71.7 kg)  09/08/16 155 lb 6.4 oz (70.5 kg)    Physical Exam: Vital signs reviewed GEN:This is a well-developed well-nourished alert cooperative    who appearsr stated age in no acute distress.  HEENT: normocephalic atraumatic , Eyes: PERRL EOM's full, conjunctiva clear, Nares: paten,t no deformity discharge or tenderness., Ears: no deformity EAC's clear TMs with normal landmarks. Mouth: clear OP, no lesions, edema.  Moist mucous membranes. Dentition in adequate repair. NECK: supple without masses, thyromegaly or bruits. CHEST/PULM:  Clear to auscultation and percussion breath sounds   equal no wheeze , rales or rhonchi. No chest wall deformities or tenderness. Breast:  normal by inspection . No dimpling, discharge, masses, tenderness or discharge . CV: PMI is nondisplaced, S1 S2 no gallops, murmurs, rubs. Peripheral pulses are full without delay.No JVD .  ABDOMEN: Bowel sounds normal nontender  No guard or rebound, no hepato splenomegal no CVA tenderness.  No hernia. Extremtities:  No clubbing cyanosis or edema, no acute joint swelling or redness no focal atrophy NEURO:  Oriented x3, cranial nerves 3-12 appear to be intact, no obvious focal weakness,gait within normal limits no abnormal reflexes or asymmetrical SKIN: No acute rashes normal turgor, color, no bruising or petechiae. PSYCH: Oriented, good eye contact, no obvious depression very talkative  But no acute anxiety y, cognition and judgment appear normal. LN: no cervical axillary  adenopathy  Lab Results  Component Value Date   WBC 6.6 01/23/2018   HGB 14.1 01/23/2018   HCT 42.0 01/23/2018   PLT 286.0 01/23/2018   GLUCOSE 91 01/23/2018   CHOL 225 (H) 01/23/2018   TRIG 235.0 (H) 01/23/2018   HDL 37.30 (L) 01/23/2018   LDLDIRECT 143.0 01/23/2018   LDLCALC 134 (H) 09/13/2016   ALT 17 01/23/2018   AST 13 01/23/2018   NA 139 01/23/2018   K 4.4 01/23/2018   CL 104 01/23/2018   CREATININE 0.69 01/23/2018   BUN 15 01/23/2018   CO2 27 01/23/2018   TSH 3.15 01/23/2018   HGBA1C 5.9 01/23/2018    BP Readings from Last 3 Encounters:  01/23/18 128/76  11/17/16 108/68  09/08/16 100/70  EKG nsr LAD no acute findings  ASSESSMENT AND PLAN:  Discussed the following assessment and plan:  Dyspnea, unspecified type - Plan: Basic metabolic panel, CBC with Differential/Platelet, Hepatic function panel, Lipid panel, TSH, Hemoglobin A1c, T4, free, DG Chest 2 View, EKG 12-Lead, C-reactive protein, DG Chest 2 View  Medication management - Plan: Basic metabolic panel, CBC with Differential/Platelet, Hepatic function panel, Lipid panel, TSH, Hemoglobin A1c, T4, free, C-reactive protein  Anxiety - Plan: Basic  metabolic panel, CBC with Differential/Platelet, Hepatic function panel, Lipid panel, TSH, Hemoglobin A1c, T4, free  Hyperlipidemia, unspecified hyperlipidemia type - Plan: Basic metabolic panel, CBC with Differential/Platelet, Hepatic function panel, Lipid panel, TSH, Hemoglobin A1c, T4, free  Hormone replacement therapy (postmenopausal) - Plan: Basic metabolic panel, CBC with Differential/Platelet, Hepatic function panel, Lipid panel, TSH, Hemoglobin A1c, T4, free  Need for influenza vaccination - Plan: Flu vaccine HIGH DOSE PF (Fluzone High dose)  Stiffness in joint - Plan: C-reactive protein  Counseling on health promotion and disease prevention   Pat agrees to dec hrt to 3 days per week but feels helps her  Declines a statin but consider other meds   Related to FO    Consider zetia and vascepa as indicated  Total 50 mins > 50% spent counseling and coordinating care as indicated in above note and in instructions to patient .   Counseled. Prolonged visit   Patient Care Team: , Standley Brooking, MD as PCP - General (Internal Medicine) Patient Instructions  Your exam is good today  Checking EKG chest x ray labs and then go from there  Will probably advise taking the zetia and add bvascepa if triglycerides are elevated .   Dec hrt to 3 days per week.   consder lung funciton testsing and  Other  Such as echo test of heart.      Preventive Care 4 Years and Older, Female Preventive care refers to  lifestyle choices and visits with your health care provider that can promote health and wellness. What does preventive care include?  A yearly physical exam. This is also called an annual well check.  Dental exams once or twice a year.  Routine eye exams. Ask your health care provider how often you should have your eyes checked.  Personal lifestyle choices, including: ? Daily care of your teeth and gums. ? Regular physical activity. ? Eating a healthy diet. ? Avoiding tobacco and drug  use. ? Limiting alcohol use. ? Practicing safe sex. ? Taking low-dose aspirin every day. ? Taking vitamin and mineral supplements as recommended by your health care provider. What happens during an annual well check? The services and screenings done by your health care provider during your annual well check will depend on your age, overall health, lifestyle risk factors, and family history of disease. Counseling Your health care provider may ask you questions about your:  Alcohol use.  Tobacco use.  Drug use.  Emotional well-being.  Home and relationship well-being.  Sexual activity.  Eating habits.  History of falls.  Memory and ability to understand (cognition).  Work and work Statistician.  Reproductive health.  Screening You may have the following tests or measurements:  Height, weight, and BMI.  Blood pressure.  Lipid and cholesterol levels. These may be checked every 5 years, or more frequently if you are over 74 years old.  Skin check.  Lung cancer screening. You may have this screening every year starting at age 72 if you have a 30-pack-year history of smoking and currently smoke or have quit within the past 15 years.  Colorectal cancer screening. All adults should have this screening starting at age 47 and continuing until age 26. You will have tests every 1-10 years, depending on your results and the type of screening test. People at increased risk should start screening at an earlier age. Screening tests may include: ? Guaiac-based fecal occult blood testing. ? Fecal immunochemical test (FIT). ? Stool DNA test. ? Virtual colonoscopy. ? Sigmoidoscopy. During this test, a flexible tube with a tiny camera (sigmoidoscope) is used to examine your rectum and lower colon. The sigmoidoscope is inserted through your anus into your rectum and lower colon. ? Colonoscopy. During this test, a long, thin, flexible tube with a tiny camera (colonoscope) is used to examine  your entire colon and rectum.  Hepatitis C blood test.  Hepatitis B blood test.  Sexually transmitted disease (STD) testing.  Diabetes screening. This is done by checking your blood sugar (glucose) after you have not eaten for a while (fasting). You may have this done every 1-3 years.  Bone density scan. This is done to screen for osteoporosis. You may have this done starting at age 105.  Mammogram. This may be done every 1-2 years. Talk to your health care provider about how often you should have regular mammograms. Talk with your health care provider about your test results, treatment options, and if necessary, the need for more tests. Vaccines Your health care provider may recommend certain vaccines, such as:  Influenza vaccine. This is recommended every year.  Tetanus, diphtheria, and acellular pertussis (Tdap, Td) vaccine. You may need a Td booster every 10 years.  Varicella vaccine. You may need this if you have not been vaccinated.  Zoster vaccine. You may need this after age 25.  Measles, mumps, and rubella (MMR) vaccine. You may need at least one dose of MMR if you were born in 1957  or later. You may also need a second dose.  Pneumococcal 13-valent conjugate (PCV13) vaccine. One dose is recommended after age 65.  Pneumococcal polysaccharide (PPSV23) vaccine. One dose is recommended after age 65.  Meningococcal vaccine. You may need this if you have certain conditions.  Hepatitis A vaccine. You may need this if you have certain conditions or if you travel or work in places where you may be exposed to hepatitis A.  Hepatitis B vaccine. You may need this if you have certain conditions or if you travel or work in places where you may be exposed to hepatitis B.  Haemophilus influenzae type b (Hib) vaccine. You may need this if you have certain conditions. Talk to your health care provider about which screenings and vaccines you need and how often you need them. This  information is not intended to replace advice given to you by your health care provider. Make sure you discuss any questions you have with your health care provider. Document Released: 01/31/2015 Document Revised: 02/24/2017 Document Reviewed: 11/05/2014 Elsevier Interactive Patient Education  2019 Elsevier Inc.      Wanda K. Panosh M.D.  

## 2018-01-23 ENCOUNTER — Ambulatory Visit (INDEPENDENT_AMBULATORY_CARE_PROVIDER_SITE_OTHER): Payer: Medicare Other

## 2018-01-23 ENCOUNTER — Ambulatory Visit (INDEPENDENT_AMBULATORY_CARE_PROVIDER_SITE_OTHER): Payer: Medicare Other | Admitting: Internal Medicine

## 2018-01-23 VITALS — BP 128/76 | HR 63 | Temp 98.3°F | Ht 64.75 in | Wt 160.6 lb

## 2018-01-23 DIAGNOSIS — R0600 Dyspnea, unspecified: Secondary | ICD-10-CM | POA: Diagnosis not present

## 2018-01-23 DIAGNOSIS — Z23 Encounter for immunization: Secondary | ICD-10-CM

## 2018-01-23 DIAGNOSIS — E785 Hyperlipidemia, unspecified: Secondary | ICD-10-CM

## 2018-01-23 DIAGNOSIS — Z79890 Hormone replacement therapy (postmenopausal): Secondary | ICD-10-CM | POA: Diagnosis not present

## 2018-01-23 DIAGNOSIS — R0602 Shortness of breath: Secondary | ICD-10-CM | POA: Diagnosis not present

## 2018-01-23 DIAGNOSIS — Z79899 Other long term (current) drug therapy: Secondary | ICD-10-CM | POA: Diagnosis not present

## 2018-01-23 DIAGNOSIS — M2560 Stiffness of unspecified joint, not elsewhere classified: Secondary | ICD-10-CM

## 2018-01-23 DIAGNOSIS — Z7189 Other specified counseling: Secondary | ICD-10-CM | POA: Diagnosis not present

## 2018-01-23 DIAGNOSIS — F419 Anxiety disorder, unspecified: Secondary | ICD-10-CM | POA: Diagnosis not present

## 2018-01-23 LAB — BASIC METABOLIC PANEL
BUN: 15 mg/dL (ref 6–23)
CALCIUM: 9.6 mg/dL (ref 8.4–10.5)
CO2: 27 meq/L (ref 19–32)
CREATININE: 0.69 mg/dL (ref 0.40–1.20)
Chloride: 104 mEq/L (ref 96–112)
GFR: 87.59 mL/min (ref >60.00)
Glucose, Bld: 91 mg/dL (ref 70–99)
Potassium: 4.4 mEq/L (ref 3.5–5.1)
SODIUM: 139 meq/L (ref 135–145)

## 2018-01-23 LAB — CBC WITH DIFFERENTIAL/PLATELET
Basophils Absolute: 0.1 10*3/uL (ref 0.0–0.1)
Basophils Relative: 0.9 % (ref 0.0–3.0)
EOS ABS: 0.2 10*3/uL (ref 0.0–0.7)
Eosinophils Relative: 2.6 % (ref 0.0–5.0)
HEMATOCRIT: 42 % (ref 36.0–46.0)
HEMOGLOBIN: 14.1 g/dL (ref 12.0–15.0)
LYMPHS PCT: 26.6 % (ref 12.0–46.0)
Lymphs Abs: 1.7 10*3/uL (ref 0.7–4.0)
MCHC: 33.5 g/dL (ref 30.0–36.0)
MCV: 91.5 fl (ref 78.0–100.0)
MONOS PCT: 10 % (ref 3.0–12.0)
Monocytes Absolute: 0.7 10*3/uL (ref 0.1–1.0)
Neutro Abs: 3.9 10*3/uL (ref 1.4–7.7)
Neutrophils Relative %: 59.9 % (ref 43.0–77.0)
Platelets: 286 10*3/uL (ref 150.0–400.0)
RBC: 4.59 Mil/uL (ref 3.87–5.11)
RDW: 13.1 % (ref 11.5–15.5)
WBC: 6.6 10*3/uL (ref 4.0–10.5)

## 2018-01-23 LAB — HEPATIC FUNCTION PANEL
ALBUMIN: 4.3 g/dL (ref 3.5–5.2)
ALT: 17 U/L (ref 0–35)
AST: 13 U/L (ref 0–37)
Alkaline Phosphatase: 43 U/L (ref 39–117)
BILIRUBIN DIRECT: 0 mg/dL (ref 0.0–0.3)
TOTAL PROTEIN: 7 g/dL (ref 6.0–8.3)
Total Bilirubin: 0.4 mg/dL (ref 0.2–1.2)

## 2018-01-23 LAB — T4, FREE: Free T4: 0.94 ng/dL (ref 0.60–1.60)

## 2018-01-23 LAB — TSH: TSH: 3.15 u[IU]/mL (ref 0.35–4.50)

## 2018-01-23 LAB — HEMOGLOBIN A1C: Hgb A1c MFr Bld: 5.9 % (ref 4.6–6.5)

## 2018-01-23 LAB — LIPID PANEL
Cholesterol: 225 mg/dL — ABNORMAL HIGH (ref 0–200)
HDL: 37.3 mg/dL — AB (ref >39.00)
NONHDL: 187.38
Total CHOL/HDL Ratio: 6
Triglycerides: 235 mg/dL — ABNORMAL HIGH (ref 0.0–149.0)
VLDL: 47 mg/dL — ABNORMAL HIGH (ref 0.0–40.0)

## 2018-01-23 LAB — C-REACTIVE PROTEIN: CRP: 0.2 mg/dL — ABNORMAL LOW (ref 0.5–20.0)

## 2018-01-23 LAB — LDL CHOLESTEROL, DIRECT: Direct LDL: 143 mg/dL

## 2018-01-23 MED ORDER — CONJ ESTROG-MEDROXYPROGEST ACE 0.3-1.5 MG PO TABS: ORAL_TABLET | ORAL | 2 refills | Status: DC

## 2018-01-23 MED ORDER — SERTRALINE HCL 50 MG PO TABS: 50.0000 mg | ORAL_TABLET | Freq: Every day | ORAL | 2 refills | Status: DC

## 2018-01-23 NOTE — Patient Instructions (Addendum)
Your exam is good today  Checking EKG chest x ray labs and then go from there  Will probably advise taking the zetia and add bvascepa if triglycerides are elevated .   Dec hrt to 3 days per week.   consder lung funciton testsing and  Other  Such as echo test of heart.      Preventive Care 23 Years and Older, Female Preventive care refers to lifestyle choices and visits with your health care provider that can promote health and wellness. What does preventive care include?  A yearly physical exam. This is also called an annual well check.  Dental exams once or twice a year.  Routine eye exams. Ask your health care provider how often you should have your eyes checked.  Personal lifestyle choices, including: ? Daily care of your teeth and gums. ? Regular physical activity. ? Eating a healthy diet. ? Avoiding tobacco and drug use. ? Limiting alcohol use. ? Practicing safe sex. ? Taking low-dose aspirin every day. ? Taking vitamin and mineral supplements as recommended by your health care provider. What happens during an annual well check? The services and screenings done by your health care provider during your annual well check will depend on your age, overall health, lifestyle risk factors, and family history of disease. Counseling Your health care provider may ask you questions about your:  Alcohol use.  Tobacco use.  Drug use.  Emotional well-being.  Home and relationship well-being.  Sexual activity.  Eating habits.  History of falls.  Memory and ability to understand (cognition).  Work and work Statistician.  Reproductive health.  Screening You may have the following tests or measurements:  Height, weight, and BMI.  Blood pressure.  Lipid and cholesterol levels. These may be checked every 5 years, or more frequently if you are over 9 years old.  Skin check.  Lung cancer screening. You may have this screening every year starting at age 70 if you  have a 30-pack-year history of smoking and currently smoke or have quit within the past 15 years.  Colorectal cancer screening. All adults should have this screening starting at age 31 and continuing until age 73. You will have tests every 1-10 years, depending on your results and the type of screening test. People at increased risk should start screening at an earlier age. Screening tests may include: ? Guaiac-based fecal occult blood testing. ? Fecal immunochemical test (FIT). ? Stool DNA test. ? Virtual colonoscopy. ? Sigmoidoscopy. During this test, a flexible tube with a tiny camera (sigmoidoscope) is used to examine your rectum and lower colon. The sigmoidoscope is inserted through your anus into your rectum and lower colon. ? Colonoscopy. During this test, a long, thin, flexible tube with a tiny camera (colonoscope) is used to examine your entire colon and rectum.  Hepatitis C blood test.  Hepatitis B blood test.  Sexually transmitted disease (STD) testing.  Diabetes screening. This is done by checking your blood sugar (glucose) after you have not eaten for a while (fasting). You may have this done every 1-3 years.  Bone density scan. This is done to screen for osteoporosis. You may have this done starting at age 5.  Mammogram. This may be done every 1-2 years. Talk to your health care provider about how often you should have regular mammograms. Talk with your health care provider about your test results, treatment options, and if necessary, the need for more tests. Vaccines Your health care provider may recommend certain vaccines, such  as:  Influenza vaccine. This is recommended every year.  Tetanus, diphtheria, and acellular pertussis (Tdap, Td) vaccine. You may need a Td booster every 10 years.  Varicella vaccine. You may need this if you have not been vaccinated.  Zoster vaccine. You may need this after age 65.  Measles, mumps, and rubella (MMR) vaccine. You may need at  least one dose of MMR if you were born in 1957 or later. You may also need a second dose.  Pneumococcal 13-valent conjugate (PCV13) vaccine. One dose is recommended after age 67.  Pneumococcal polysaccharide (PPSV23) vaccine. One dose is recommended after age 30.  Meningococcal vaccine. You may need this if you have certain conditions.  Hepatitis A vaccine. You may need this if you have certain conditions or if you travel or work in places where you may be exposed to hepatitis A.  Hepatitis B vaccine. You may need this if you have certain conditions or if you travel or work in places where you may be exposed to hepatitis B.  Haemophilus influenzae type b (Hib) vaccine. You may need this if you have certain conditions. Talk to your health care provider about which screenings and vaccines you need and how often you need them. This information is not intended to replace advice given to you by your health care provider. Make sure you discuss any questions you have with your health care provider. Document Released: 01/31/2015 Document Revised: 02/24/2017 Document Reviewed: 11/05/2014 Elsevier Interactive Patient Education  2019 [employer].

## 2018-02-03 ENCOUNTER — Other Ambulatory Visit: Payer: Self-pay | Admitting: Internal Medicine

## 2018-02-03 ENCOUNTER — Other Ambulatory Visit: Payer: Self-pay

## 2018-02-03 ENCOUNTER — Encounter: Payer: Self-pay | Admitting: Internal Medicine

## 2018-02-03 DIAGNOSIS — E785 Hyperlipidemia, unspecified: Secondary | ICD-10-CM

## 2018-02-03 DIAGNOSIS — R0602 Shortness of breath: Secondary | ICD-10-CM

## 2018-02-03 DIAGNOSIS — R0600 Dyspnea, unspecified: Secondary | ICD-10-CM

## 2018-02-03 MED ORDER — EZETIMIBE 10 MG PO TABS: 10.0000 mg | ORAL_TABLET | Freq: Every day | ORAL | 3 refills | Status: DC

## 2018-02-06 ENCOUNTER — Telehealth: Payer: Self-pay | Admitting: Internal Medicine

## 2018-02-06 NOTE — Telephone Encounter (Signed)
Please advise Dr Regis Bill if you wanted a plain Echo or Bubble Study, thanks.

## 2018-02-06 NOTE — Telephone Encounter (Signed)
Copied from [place]. Topic: General - Other >> Feb 06, 2018  9:38 AM Yvette Rack wrote: Reason for CRM: [facility] with [facility] called for clarity on the echo complete bubble study. Gerri asked if the request should be with bubbles. Cb# [phone]

## 2018-02-06 NOTE — Telephone Encounter (Signed)
No   Bubble  Just regular complete echo .

## 2018-02-07 NOTE — Telephone Encounter (Signed)
Bubble study order cancelled.  Regular order is in computer already.  Gerri aware at [facility] Nothing further needed.

## 2018-02-13 NOTE — Progress Notes (Signed)
Cardiology Office Note   Date:  02/16/2018   ID:  ORLI DEGRAVE, DOB 31-Aug-1940, MRN [id number]  PCP:  Burnis Medin, MD  Cardiologist:   Jenkins Rouge, MD   No chief complaint on file.     History of Present Illness: Summer Collins is a 78 y.o. female who presents for consultation regarding dyspnea Referred by Dr Regis Bill. Reviewed her office note from 02/13/18 Patient feeling more breathless over the last year. No chest pain. Especially going up and down stairs Has elevated lipids but will not take statin or zetia Use to use Lovaza    W/U included normal CXR Hct 42 TSH 3.15 A1c 5.9  TTE ordered but not done yet  Non smoker Denies asthma cough sputum fever.  No history of cardiac valve disease murmur or edema  She is a Agricultural consultant and trying to run [employer] run for [employer]  of stress  Husband sees Chief of Staff for [employer] Two boys doing well One runs produce company with husband and they have cattle in [place]      Past Medical History:  Diagnosis Date  . Anxiety     on low dose zoloft for a while  . Hyperlipidemia   . Osteopenia   . Vitamin D deficiency     Past Surgical History:  Procedure Laterality Date  . CARPAL TUNNEL RELEASE    . DILATION AND CURETTAGE OF UTERUS     x 3     Current Outpatient Medications  Medication Sig Dispense Refill  . benzonatate (TESSALON) 100 MG capsule Take 1 capsule (100 mg total) by mouth 3 (three) times daily as needed for cough. 21 capsule 0  . Calcium Carbonate-Vitamin D (CALCIUM 600 + D PO) Take by mouth. 1 bid       . Cholecalciferol (VITAMIN D3) 1000 UNITS CAPS Take by mouth.    . Cyanocobalamin (VITAMIN B 12 PO) Take by mouth. 2523mcg daily    . estrogen, conjugated,-medroxyprogesterone (PREMPRO) 0.3-1.5 MG per tablet TAKE 1 TABLET DAILY, 6 DAYS OUT OF THE WEEK    . estrogen, conjugated,-medroxyprogesterone (PREMPRO) 0.3-1.5 MG tablet TAKE 1 TABLET 4 DAYS OUT   OF THE WEEK , WEAN AS DIRECTED 56  tablet 2  . ezetimibe (ZETIA) 10 MG tablet Take 1 tablet (10 mg total) by mouth daily. 30 tablet 3  . Glucos-MSM-C-Mn-Ginger-Willow (GLUCOSAMINE MSM COMPLEX PO) Take by mouth.    . Omega-3 Fatty Acids (FISH OIL) 1000 MG CAPS Take by mouth.    . sertraline (ZOLOFT) 50 MG tablet Take 1 tablet (50 mg total) by mouth daily. Can decrease to 25 mg per day 90 tablet 2  . triamcinolone cream (KENALOG) 0.1 % Apply 1 application topically 2 (two) times daily. Not on face for itchy rash 30 g 0   No current facility-administered medications for this visit.     Allergies:   Sulfa antibiotics and Tricor [fenofibrate]    Social History:  The patient  reports that she has quit smoking. She has never used smokeless tobacco. She reports current alcohol use of about 1.0 standard drinks of alcohol per week. She reports that she does not use drugs.   Family History:  The patient's family history includes Cancer in her father.    ROS:  Please see the history of present illness.   Otherwise, review of systems are positive for none.   All other systems are reviewed and negative.    PHYSICAL EXAM: VS:  BP  138/82   Pulse 70   Ht 5' 4.75" (1.645 m)   Wt 162 lb (73.5 kg)   BMI 27.17 kg/m  , BMI Body mass index is 27.17 kg/m. Affect appropriate Healthy:  appears stated age 89: normal Neck supple with no adenopathy JVP normal no bruits no thyromegaly Lungs clear with no wheezing and good diaphragmatic motion Heart:  S1/S2 no murmur, no rub, gallop or click PMI normal Abdomen: benighn, BS positve, no tenderness, no AAA no bruit.  No HSM or HJR Distal pulses intact with no bruits No edema Neuro non-focal Skin warm and dry No muscular weakness    EKG:  SR rate 57 LAD normal 01/23/18    Recent Labs: 01/23/2018: ALT 17; BUN 15; Creatinine, Ser 0.69; Hemoglobin 14.1; Platelets 286.0; Potassium 4.4; Sodium 139; TSH 3.15    Lipid Panel    Component Value Date/Time   CHOL 225 (H) 01/23/2018 1204    TRIG 235.0 (H) 01/23/2018 1204   HDL 37.30 (L) 01/23/2018 1204   CHOLHDL 6 01/23/2018 1204   VLDL 47.0 (H) 01/23/2018 1204   LDLCALC 134 (H) 09/13/2016 0818   LDLDIRECT 143.0 01/23/2018 1204      Wt Readings from Last 3 Encounters:  02/16/18 162 lb (73.5 kg)  01/23/18 160 lb 9.6 oz (72.8 kg)  11/17/16 158 lb (71.7 kg)      Other studies Reviewed: Additional studies/ records that were reviewed today include: Notes from primary labs CXR ECG Echo .    ASSESSMENT AND PLAN:  1.  Dyspnea:  Doubt cardiac etiology echo pending for tomorrow no further w/u Needed if normal  2. HLD:  Told to start zetia by primary f/u labs with her 3. Anxiety/Depression continue Zoloft    Current medicines are reviewed at length with the patient today.  The patient does not have concerns regarding medicines.  The following changes have been made:  no change  Labs/ tests ordered today include: none  No orders of the defined types were placed in this encounter.    Disposition:   FU with cardiology PRN      Signed, Jenkins Rouge, MD  02/16/2018 10:10 AM    [facility] [address], [place], [place]  [address] Phone: [phone]; Fax: [phone]

## 2018-02-16 ENCOUNTER — Ambulatory Visit (INDEPENDENT_AMBULATORY_CARE_PROVIDER_SITE_OTHER): Payer: Medicare Other | Admitting: Cardiovascular Disease

## 2018-02-16 VITALS — BP 138/82 | HR 70 | Ht 64.75 in | Wt 162.0 lb

## 2018-02-16 DIAGNOSIS — E785 Hyperlipidemia, unspecified: Secondary | ICD-10-CM

## 2018-02-16 DIAGNOSIS — R0600 Dyspnea, unspecified: Secondary | ICD-10-CM

## 2018-02-16 NOTE — Patient Instructions (Addendum)
Medication Instructions:   If you need a refill on your cardiac medications before your next appointment, please call your pharmacy.   Lab work:  If you have labs (blood work) drawn today and your tests are completely normal, you will receive your results only by: . MyChart Message (if you have MyChart) OR . A paper copy in the mail If you have any lab test that is abnormal or we need to change your treatment, we will call you to review the results.  Testing/Procedures: None ordered today.  Follow-Up: At [facility], you and your health needs are our priority.  As part of our continuing mission to provide you with exceptional heart care, we have created designated Provider Care Teams.  These Care Teams include your primary Cardiologist (physician) and Advanced Practice Providers (APPs -  Physician Assistants and Nurse Practitioners) who all work together to provide you with the care you need, when you need it. Your physician recommends that you schedule a follow-up appointment as needed with Dr. Nishan.   

## 2018-02-17 ENCOUNTER — Ambulatory Visit (HOSPITAL_COMMUNITY): Payer: Medicare Other | Attending: Cardiovascular Disease

## 2018-02-17 DIAGNOSIS — E785 Hyperlipidemia, unspecified: Secondary | ICD-10-CM

## 2018-02-17 DIAGNOSIS — R0600 Dyspnea, unspecified: Secondary | ICD-10-CM | POA: Insufficient documentation

## 2018-02-17 DIAGNOSIS — R0602 Shortness of breath: Secondary | ICD-10-CM | POA: Diagnosis not present

## 2018-09-15 ENCOUNTER — Telehealth: Payer: Self-pay

## 2018-09-15 DIAGNOSIS — Z13820 Encounter for screening for osteoporosis: Secondary | ICD-10-CM

## 2018-09-15 DIAGNOSIS — E2839 Other primary ovarian failure: Secondary | ICD-10-CM

## 2018-09-15 NOTE — Telephone Encounter (Signed)
Copied from [place] [id number]. Topic: General - Other >> Sep 14, 2018  3:59 PM Alanda Slim E wrote: Reason for CRM: Pt has an appt at [facility] on 9.25.20 and they will also do a bone density for her but they need an order for it sent to them before her appt on 9.25.20/ please advise

## 2018-09-18 DIAGNOSIS — L820 Inflamed seborrheic keratosis: Secondary | ICD-10-CM | POA: Diagnosis not present

## 2018-09-18 DIAGNOSIS — D225 Melanocytic nevi of trunk: Secondary | ICD-10-CM | POA: Diagnosis not present

## 2018-09-18 DIAGNOSIS — Z85828 Personal history of other malignant neoplasm of skin: Secondary | ICD-10-CM | POA: Diagnosis not present

## 2018-09-18 DIAGNOSIS — D1801 Hemangioma of skin and subcutaneous tissue: Secondary | ICD-10-CM | POA: Diagnosis not present

## 2018-09-18 DIAGNOSIS — L821 Other seborrheic keratosis: Secondary | ICD-10-CM | POA: Diagnosis not present

## 2018-09-18 DIAGNOSIS — L570 Actinic keratosis: Secondary | ICD-10-CM | POA: Diagnosis not present

## 2018-09-19 NOTE — Addendum Note (Signed)
Addended by: Modena Morrow R on: 09/19/2018 01:35 PM   Modules accepted: Orders

## 2018-09-19 NOTE — Telephone Encounter (Signed)
Order has been put in 

## 2018-09-19 NOTE — Telephone Encounter (Signed)
Please send in fax order dx estrogen deficient  For dexa as requested

## 2018-10-13 DIAGNOSIS — M8589 Other specified disorders of bone density and structure, multiple sites: Secondary | ICD-10-CM | POA: Diagnosis not present

## 2018-10-13 DIAGNOSIS — Z8262 Family history of osteoporosis: Secondary | ICD-10-CM | POA: Diagnosis not present

## 2018-10-13 DIAGNOSIS — R29890 Loss of height: Secondary | ICD-10-CM | POA: Diagnosis not present

## 2018-10-13 DIAGNOSIS — Z1231 Encounter for screening mammogram for malignant neoplasm of breast: Secondary | ICD-10-CM | POA: Diagnosis not present

## 2018-10-13 LAB — HM DEXA SCAN

## 2018-10-13 LAB — HM MAMMOGRAPHY

## 2018-11-09 ENCOUNTER — Encounter: Payer: Self-pay | Admitting: Internal Medicine

## 2018-11-15 ENCOUNTER — Encounter: Payer: Self-pay | Admitting: Internal Medicine

## 2018-12-13 ENCOUNTER — Other Ambulatory Visit: Payer: Self-pay | Admitting: Internal Medicine

## 2018-12-21 DIAGNOSIS — Z23 Encounter for immunization: Secondary | ICD-10-CM | POA: Diagnosis not present

## 2019-02-07 DIAGNOSIS — M67911 Unspecified disorder of synovium and tendon, right shoulder: Secondary | ICD-10-CM | POA: Diagnosis not present

## 2019-02-07 DIAGNOSIS — M25551 Pain in right hip: Secondary | ICD-10-CM | POA: Diagnosis not present

## 2019-02-14 DIAGNOSIS — M25611 Stiffness of right shoulder, not elsewhere classified: Secondary | ICD-10-CM | POA: Diagnosis not present

## 2019-02-14 DIAGNOSIS — M25511 Pain in right shoulder: Secondary | ICD-10-CM | POA: Diagnosis not present

## 2019-02-14 DIAGNOSIS — M25551 Pain in right hip: Secondary | ICD-10-CM | POA: Diagnosis not present

## 2019-02-14 DIAGNOSIS — M6281 Muscle weakness (generalized): Secondary | ICD-10-CM | POA: Diagnosis not present

## 2019-02-20 DIAGNOSIS — M25511 Pain in right shoulder: Secondary | ICD-10-CM | POA: Diagnosis not present

## 2019-02-20 DIAGNOSIS — M6281 Muscle weakness (generalized): Secondary | ICD-10-CM | POA: Diagnosis not present

## 2019-02-20 DIAGNOSIS — R262 Difficulty in walking, not elsewhere classified: Secondary | ICD-10-CM | POA: Diagnosis not present

## 2019-02-20 DIAGNOSIS — M25611 Stiffness of right shoulder, not elsewhere classified: Secondary | ICD-10-CM | POA: Diagnosis not present

## 2019-02-23 DIAGNOSIS — R262 Difficulty in walking, not elsewhere classified: Secondary | ICD-10-CM | POA: Diagnosis not present

## 2019-02-23 DIAGNOSIS — M6281 Muscle weakness (generalized): Secondary | ICD-10-CM | POA: Diagnosis not present

## 2019-02-23 DIAGNOSIS — M25611 Stiffness of right shoulder, not elsewhere classified: Secondary | ICD-10-CM | POA: Diagnosis not present

## 2019-02-23 DIAGNOSIS — M25511 Pain in right shoulder: Secondary | ICD-10-CM | POA: Diagnosis not present

## 2019-02-27 DIAGNOSIS — M6281 Muscle weakness (generalized): Secondary | ICD-10-CM | POA: Diagnosis not present

## 2019-02-27 DIAGNOSIS — M25611 Stiffness of right shoulder, not elsewhere classified: Secondary | ICD-10-CM | POA: Diagnosis not present

## 2019-02-27 DIAGNOSIS — R262 Difficulty in walking, not elsewhere classified: Secondary | ICD-10-CM | POA: Diagnosis not present

## 2019-02-27 DIAGNOSIS — M25511 Pain in right shoulder: Secondary | ICD-10-CM | POA: Diagnosis not present

## 2019-03-01 DIAGNOSIS — M6281 Muscle weakness (generalized): Secondary | ICD-10-CM | POA: Diagnosis not present

## 2019-03-01 DIAGNOSIS — R262 Difficulty in walking, not elsewhere classified: Secondary | ICD-10-CM | POA: Diagnosis not present

## 2019-03-01 DIAGNOSIS — M25511 Pain in right shoulder: Secondary | ICD-10-CM | POA: Diagnosis not present

## 2019-03-01 DIAGNOSIS — M25611 Stiffness of right shoulder, not elsewhere classified: Secondary | ICD-10-CM | POA: Diagnosis not present

## 2019-03-03 ENCOUNTER — Ambulatory Visit: Payer: Medicare Other | Attending: Internal Medicine

## 2019-03-03 DIAGNOSIS — Z23 Encounter for immunization: Secondary | ICD-10-CM | POA: Insufficient documentation

## 2019-03-03 NOTE — Progress Notes (Signed)
   Covid-19 Vaccination Clinic  Name:  Summer Collins    MRN: [id number] DOB: [birth year]  03/03/2019  [name] was observed post Covid-19 immunization for 15 minutes without incidence. She was provided with Vaccine Information Sheet and instruction to access the V-Safe system.   [name] was instructed to call 911 with any severe reactions post vaccine: Marland Kitchen Difficulty breathing  . Swelling of your face and throat  . A fast heartbeat  . A bad rash all over your body  . Dizziness and weakness    Immunizations Administered    Name Date Dose VIS Date Route   Pfizer COVID-19 Vaccine 03/03/2019  8:51 AM 0.3 mL 12/29/2018 Intramuscular   Manufacturer: [place]   Lot: [id number]   [place]: [id number]

## 2019-03-06 DIAGNOSIS — M25511 Pain in right shoulder: Secondary | ICD-10-CM | POA: Diagnosis not present

## 2019-03-06 DIAGNOSIS — M6281 Muscle weakness (generalized): Secondary | ICD-10-CM | POA: Diagnosis not present

## 2019-03-06 DIAGNOSIS — R262 Difficulty in walking, not elsewhere classified: Secondary | ICD-10-CM | POA: Diagnosis not present

## 2019-03-06 DIAGNOSIS — M25611 Stiffness of right shoulder, not elsewhere classified: Secondary | ICD-10-CM | POA: Diagnosis not present

## 2019-03-07 DIAGNOSIS — M67911 Unspecified disorder of synovium and tendon, right shoulder: Secondary | ICD-10-CM | POA: Diagnosis not present

## 2019-03-12 DIAGNOSIS — M25611 Stiffness of right shoulder, not elsewhere classified: Secondary | ICD-10-CM | POA: Diagnosis not present

## 2019-03-12 DIAGNOSIS — M6281 Muscle weakness (generalized): Secondary | ICD-10-CM | POA: Diagnosis not present

## 2019-03-12 DIAGNOSIS — M25511 Pain in right shoulder: Secondary | ICD-10-CM | POA: Diagnosis not present

## 2019-03-12 DIAGNOSIS — M25551 Pain in right hip: Secondary | ICD-10-CM | POA: Diagnosis not present

## 2019-03-14 DIAGNOSIS — M25511 Pain in right shoulder: Secondary | ICD-10-CM | POA: Diagnosis not present

## 2019-03-14 DIAGNOSIS — M6281 Muscle weakness (generalized): Secondary | ICD-10-CM | POA: Diagnosis not present

## 2019-03-14 DIAGNOSIS — R262 Difficulty in walking, not elsewhere classified: Secondary | ICD-10-CM | POA: Diagnosis not present

## 2019-03-14 DIAGNOSIS — M25611 Stiffness of right shoulder, not elsewhere classified: Secondary | ICD-10-CM | POA: Diagnosis not present

## 2019-03-22 DIAGNOSIS — M6281 Muscle weakness (generalized): Secondary | ICD-10-CM | POA: Diagnosis not present

## 2019-03-22 DIAGNOSIS — M25511 Pain in right shoulder: Secondary | ICD-10-CM | POA: Diagnosis not present

## 2019-03-22 DIAGNOSIS — M25611 Stiffness of right shoulder, not elsewhere classified: Secondary | ICD-10-CM | POA: Diagnosis not present

## 2019-03-22 DIAGNOSIS — R262 Difficulty in walking, not elsewhere classified: Secondary | ICD-10-CM | POA: Diagnosis not present

## 2019-03-23 ENCOUNTER — Other Ambulatory Visit: Payer: Self-pay | Admitting: Internal Medicine

## 2019-03-25 ENCOUNTER — Ambulatory Visit: Payer: Medicare Other | Attending: Internal Medicine

## 2019-03-25 DIAGNOSIS — Z23 Encounter for immunization: Secondary | ICD-10-CM | POA: Insufficient documentation

## 2019-03-25 NOTE — Progress Notes (Signed)
   Covid-19 Vaccination Clinic  Name:  Summer Collins    MRN: [id number] DOB: 1940-04-03  03/25/2019  Ms. Branch was observed post Covid-19 immunization for 15 minutes without incident. She was provided with Vaccine Information Sheet and instruction to access the V-Safe system.   [name] was instructed to call 911 with any severe reactions post vaccine: Marland Kitchen Difficulty breathing  . Swelling of face and throat  . A fast heartbeat  . A bad rash all over body  . Dizziness and weakness   Immunizations Administered    Name Date Dose VIS Date Route   Pfizer COVID-19 Vaccine 03/25/2019 11:58 AM 0.3 mL 12/29/2018 Intramuscular   Manufacturer: [place]   Lot: [id number]   [place]: [id number]

## 2019-03-28 DIAGNOSIS — M6281 Muscle weakness (generalized): Secondary | ICD-10-CM | POA: Diagnosis not present

## 2019-03-28 DIAGNOSIS — M25511 Pain in right shoulder: Secondary | ICD-10-CM | POA: Diagnosis not present

## 2019-03-28 DIAGNOSIS — R262 Difficulty in walking, not elsewhere classified: Secondary | ICD-10-CM | POA: Diagnosis not present

## 2019-03-28 DIAGNOSIS — M25611 Stiffness of right shoulder, not elsewhere classified: Secondary | ICD-10-CM | POA: Diagnosis not present

## 2019-03-30 DIAGNOSIS — M25511 Pain in right shoulder: Secondary | ICD-10-CM | POA: Diagnosis not present

## 2019-03-30 DIAGNOSIS — M6281 Muscle weakness (generalized): Secondary | ICD-10-CM | POA: Diagnosis not present

## 2019-03-30 DIAGNOSIS — M25611 Stiffness of right shoulder, not elsewhere classified: Secondary | ICD-10-CM | POA: Diagnosis not present

## 2019-03-30 DIAGNOSIS — R262 Difficulty in walking, not elsewhere classified: Secondary | ICD-10-CM | POA: Diagnosis not present

## 2019-04-03 DIAGNOSIS — R262 Difficulty in walking, not elsewhere classified: Secondary | ICD-10-CM | POA: Diagnosis not present

## 2019-04-03 DIAGNOSIS — M25611 Stiffness of right shoulder, not elsewhere classified: Secondary | ICD-10-CM | POA: Diagnosis not present

## 2019-04-03 DIAGNOSIS — M25511 Pain in right shoulder: Secondary | ICD-10-CM | POA: Diagnosis not present

## 2019-04-03 DIAGNOSIS — M6281 Muscle weakness (generalized): Secondary | ICD-10-CM | POA: Diagnosis not present

## 2019-04-04 ENCOUNTER — Telehealth: Payer: Self-pay | Admitting: Internal Medicine

## 2019-04-04 DIAGNOSIS — D485 Neoplasm of uncertain behavior of skin: Secondary | ICD-10-CM | POA: Diagnosis not present

## 2019-04-04 DIAGNOSIS — D692 Other nonthrombocytopenic purpura: Secondary | ICD-10-CM | POA: Diagnosis not present

## 2019-04-04 DIAGNOSIS — L304 Erythema intertrigo: Secondary | ICD-10-CM | POA: Diagnosis not present

## 2019-04-04 DIAGNOSIS — L570 Actinic keratosis: Secondary | ICD-10-CM | POA: Diagnosis not present

## 2019-04-04 DIAGNOSIS — D225 Melanocytic nevi of trunk: Secondary | ICD-10-CM | POA: Diagnosis not present

## 2019-04-04 NOTE — Telephone Encounter (Addendum)
Pt is scheduled for her annual cpe on April 28 at 10:30am which is [facility] next availability. Pt would like her medication refilled until she can be seen.   Medication Refill:  Sertraline  Prempro  Pharmacy: [facility]  [id number]   Pt would like to be notified if itll be refilled or not. [phone]

## 2019-04-05 DIAGNOSIS — M25611 Stiffness of right shoulder, not elsewhere classified: Secondary | ICD-10-CM | POA: Diagnosis not present

## 2019-04-05 DIAGNOSIS — M25511 Pain in right shoulder: Secondary | ICD-10-CM | POA: Diagnosis not present

## 2019-04-05 DIAGNOSIS — M6281 Muscle weakness (generalized): Secondary | ICD-10-CM | POA: Diagnosis not present

## 2019-04-05 DIAGNOSIS — R262 Difficulty in walking, not elsewhere classified: Secondary | ICD-10-CM | POA: Diagnosis not present

## 2019-04-05 NOTE — Telephone Encounter (Signed)
Noted! We do not schedule pt for CPE back to back unless the provider states that it is okay. We do know that Dr.Fry and Dr. Volanda Napoleon does not mind this. Thanks for the update will note for future scheduling.

## 2019-04-05 NOTE — Telephone Encounter (Signed)
Ok thanks 

## 2019-04-05 NOTE — Telephone Encounter (Signed)
So you may not know this but any   30 minute appt is good  For cpx  No deliniation or restriction at this time for open  Non sda  In person appts.    And I see many  openings On schedule   230 April 7,afternoons  April 12   9 am April 9,  Afternoons April 14,  10 am and 11 am April 16th   To name  few .   Sending  this message to Summer Collins   For her to review and figure out why these slots not available for scheduling cpx  thanks

## 2019-04-05 NOTE — Telephone Encounter (Signed)
Please advise if refill is okay. Prempro has not been refilled since 2014.

## 2019-04-05 NOTE — Telephone Encounter (Signed)
This was your earliest time for a CPE as stated by the front office staff below. I reviewed the schedule and I do not see any openings either sooner. Please advise the date pt can move to.

## 2019-04-05 NOTE — Telephone Encounter (Signed)
There are many openings before April 28  we can move appt sooner if she wishes (I see a couple of tues am and others   Wed pm some fridays.)  Please advise me if she was told no openings or if the April 28 appt  is just her preference . In the interim can do refill x 1  ( last rx was in November 2020 so not sure about why you said 2014  I removed the older  Version so this is updated)

## 2019-04-11 DIAGNOSIS — M67911 Unspecified disorder of synovium and tendon, right shoulder: Secondary | ICD-10-CM | POA: Diagnosis not present

## 2019-04-12 DIAGNOSIS — R262 Difficulty in walking, not elsewhere classified: Secondary | ICD-10-CM | POA: Diagnosis not present

## 2019-04-12 DIAGNOSIS — M25611 Stiffness of right shoulder, not elsewhere classified: Secondary | ICD-10-CM | POA: Diagnosis not present

## 2019-04-12 DIAGNOSIS — M6281 Muscle weakness (generalized): Secondary | ICD-10-CM | POA: Diagnosis not present

## 2019-04-12 DIAGNOSIS — M25511 Pain in right shoulder: Secondary | ICD-10-CM | POA: Diagnosis not present

## 2019-04-16 DIAGNOSIS — R262 Difficulty in walking, not elsewhere classified: Secondary | ICD-10-CM | POA: Diagnosis not present

## 2019-04-16 DIAGNOSIS — M25661 Stiffness of right knee, not elsewhere classified: Secondary | ICD-10-CM | POA: Diagnosis not present

## 2019-04-16 DIAGNOSIS — M25511 Pain in right shoulder: Secondary | ICD-10-CM | POA: Diagnosis not present

## 2019-04-16 DIAGNOSIS — M6281 Muscle weakness (generalized): Secondary | ICD-10-CM | POA: Diagnosis not present

## 2019-04-18 DIAGNOSIS — M25611 Stiffness of right shoulder, not elsewhere classified: Secondary | ICD-10-CM | POA: Diagnosis not present

## 2019-04-18 DIAGNOSIS — M25511 Pain in right shoulder: Secondary | ICD-10-CM | POA: Diagnosis not present

## 2019-04-18 DIAGNOSIS — M6281 Muscle weakness (generalized): Secondary | ICD-10-CM | POA: Diagnosis not present

## 2019-04-18 DIAGNOSIS — R262 Difficulty in walking, not elsewhere classified: Secondary | ICD-10-CM | POA: Diagnosis not present

## 2019-04-27 NOTE — Progress Notes (Signed)
Chief Complaint  Patient presents with  . Annual Exam    has had ortho visits and will tell you  . Medication Management    HPI: Patient  Summer Collins  79 y.o. comes in today for yearly visit  Med eval Had been doing well until about dec and having joint problems first right shoulder and now left and this week knees swollen  No fever  Discouraged about activity since pain shoulde can awaken her at night.   Takes aleve many days but not daily Asks about toradol and celebrex    To see her ortho  In 1-2 weeks has had injections shoulder  Has hip arthritis? Marland Kitchen   No fever parent with arthritis    Sertraline mood down from last year ( take in 25- 50 of med  )  Out side of covid   Personal health  zetia   Not taking lipid medicine not sure why   There was a med years ago too expensive that she could tolerate not sure name   covid vaccine  hrt : 4 per week.  Wants to keep for now  Has tried to wean Denies exercise intoleracne ( not able to reallly exercise from ortho problems)   Health Maintenance  Topic Date Due  . PNA vac Low Risk Adult (2 of 2 - PPSV23) 12/18/2013  . TETANUS/TDAP  07/26/2019  . INFLUENZA VACCINE  08/19/2019  . MAMMOGRAM  10/13/2019  . DEXA SCAN  Completed   Health Maintenance Review LIFESTYLE:  Exercise:  Nothing cause of joints  Tobacco/ETS: no Alcohol:   Not much  Sugar beverages: Sleep: wake up  From shoulder pain  Since December  Drug use: no HH of  2  nopets      ROS:  GEN/ HEENT: No fever, significant weight changes sweats headaches vision problems hearing changes, CV/ PULM; No chest pain shortness of breath cough, syncope,edema  change in exercise tolerance. GI /GU: No adominal pain, vomiting, change in bowel habits. No blood in the stool. No significant GU symptoms. SKIN/HEME: ,no acute skin rashes suspicious lesions or bleeding. No lymphadenopathy, nodules, masses.  NEURO/ PSYCH:  No neurologic signs such as weakness numbness. No depression  anxiety. IMM/ Allergy: No unusual infections.  Allergy .   REST of 12 system review negative except as per HPI   Past Medical History:  Diagnosis Date  . Anxiety     on low dose zoloft for a while  . Hyperlipidemia   . Osteopenia   . Vitamin D deficiency     Past Surgical History:  Procedure Laterality Date  . CARPAL TUNNEL RELEASE    . DILATION AND CURETTAGE OF UTERUS     x 3    Family History  Problem Relation Age of Onset  . Cancer Father        prostate and spread    Social History   Socioeconomic History  . Marital status: Married    Spouse name: Not on file  . Number of children: Not on file  . Years of education: Not on file  . Highest education level: Not on file  Occupational History  . Not on file  Tobacco Use  . Smoking status: Former Research scientist (life sciences)  . Smokeless tobacco: Never Used  Substance and Sexual Activity  . Alcohol use: Yes    Alcohol/week: 1.0 standard drinks    Types: 1 Standard drinks or equivalent per week  . Drug use: No  . Sexual activity: Not  on file  Other Topics Concern  . Not on file  Social History Narrative   Married   hh of 2  No pets    2 children and grandchildren   ai   In [place] since [age]   G4 P2  2 miscar   BS degree Home economics form [employer]   retired  Currently in Press photographer.               Social Determinants of Health   Financial Resource Strain:   . Difficulty of Paying Living Expenses:   Food Insecurity:   . Worried About Charity fundraiser in the Last Year:   . Arboriculturist in the Last Year:   Transportation Needs:   . Film/video editor (Medical):   Marland Kitchen Lack of Transportation (Non-Medical):   Physical Activity:   . Days of Exercise per Week:   . Minutes of Exercise per Session:   Stress:   . Feeling of Stress :   Social Connections:   . Frequency of Communication with Friends and Family:   . Frequency of Social Gatherings with Friends and Family:   . Attends Religious Services:   . Active Member  of Clubs or Organizations:   . Attends Archivist Meetings:   Marland Kitchen Marital Status:     Outpatient Medications Prior to Visit  Medication Sig Dispense Refill  . Calcium Carbonate-Vitamin D (CALCIUM 600 + D PO) Take by mouth. 1 bid       . Cyanocobalamin (VITAMIN B 12 PO) Take by mouth. 2559mcg daily    . econazole nitrate 1 % cream Apply 1 application topically 2 (two) times daily.    Marland Kitchen estrogen, conjugated,-medroxyprogesterone (PREMPRO) 0.3-1.5 MG tablet TAKE 1 TABLET FOUR DAYS OUT OF THE WEEK. WEAN AS DIRECTED 56 tablet 0  . Glucos-MSM-C-Mn-Ginger-Willow (GLUCOSAMINE MSM COMPLEX PO) Take by mouth.    Marland Kitchen ketoconazole (NIZORAL) 2 % cream APPLY A SMALL AMOUNT TO AFFECTED AREA ONCE A DAY    . mupirocin ointment (BACTROBAN) 2 % APPLY SMALL AMOUNT TOPICALLY TO THE AFFECTED AREA TWICE DAILY    . Omega-3 Fatty Acids (FISH OIL) 1000 MG CAPS Take by mouth.    . sertraline (ZOLOFT) 50 MG tablet TAKE 1 TABLET DAILY (CAN DECREASE TO 25 MG DAILY) (Patient taking differently: Take 50 mg by mouth daily. Will take 25 mg as needed) 90 tablet 0  . Cholecalciferol (VITAMIN D3) 1000 UNITS CAPS Take by mouth.    . benzonatate (TESSALON) 100 MG capsule Take 1 capsule (100 mg total) by mouth 3 (three) times daily as needed for cough. (Patient not taking: Reported on 04/30/2019) 21 capsule 0  . ezetimibe (ZETIA) 10 MG tablet Take 1 tablet (10 mg total) by mouth daily. (Patient not taking: Reported on 04/30/2019) 30 tablet 3  . triamcinolone cream (KENALOG) 0.1 % Apply 1 application topically 2 (two) times daily. Not on face for itchy rash (Patient not taking: Reported on 04/30/2019) 30 g 0   No facility-administered medications prior to visit.     EXAM:  BP 116/78   Pulse 75   Temp 98 F (36.7 C) (Temporal)   Ht 5' 5.25" (1.657 m)   Wt 158 lb 3.2 oz (71.8 kg)   SpO2 98%   BMI 26.12 kg/m   Body mass index is 26.12 kg/m. Wt Readings from Last 3 Encounters:  04/30/19 158 lb 3.2 oz (71.8 kg)   02/16/18 162 lb (73.5 kg)  01/23/18  160 lb 9.6 oz (72.8 kg)    Physical Exam: Vital signs reviewed [id number] is a well-developed well-nourished alert cooperative    who appearsr stated age in no acute distress.  HEENT: normocephalic atraumatic , Eyes: PERRL EOM's full, conjunctiva clear, Nares: paten,t no deformity discharge or tenderness., Ears: no deformity EAC's clear TMs with normal landmarks. Mouth: clear OP,masked  NECK: supple without masses, thyromegaly or bruits. CHEST/PULM:  Clear to auscultation and percussion breath sounds equal no wheeze , rales or rhonchi. No chest wall deformities or tenderness. Breast: normal by inspection . No dimpling, discharge, masses, tenderness or discharge . CV: PMI is nondisplaced, S1 S2 no gallops, murmurs, rubs. Peripheral pulses are full without delay.No JVD .  ABDOMEN: Bowel sounds normal nontender  No guard or rebound, no hepato splenomegal no CVA tenderness.   Extremtities:  No clubbing cyanosis or edema,dec rom left shoulder   Knees OA changes  No warnth fluid    NEURO:  Oriented x3, cranial nerves 3-12 appear to be intact, no obvious focal weakness,gait within normal limits no abnormal reflexes or asymmetrical SKIN: No acute rashes normal turgor, color, no bruising or petechiae. Nancy Fetter age changes on legs PSYCH: Oriented, good eye contact, no obvious depression anxiety, cognition and judgment appear normal. LN: no cervical axillary adenopathy  Lab Results  Component Value Date   WBC 6.6 01/23/2018   HGB 14.1 01/23/2018   HCT 42.0 01/23/2018   PLT 286.0 01/23/2018   GLUCOSE 91 01/23/2018   CHOL 225 (H) 01/23/2018   TRIG 235.0 (H) 01/23/2018   HDL 37.30 (L) 01/23/2018   LDLDIRECT 143.0 01/23/2018   LDLCALC 134 (H) 09/13/2016   ALT 17 01/23/2018   AST 13 01/23/2018   NA 139 01/23/2018   K 4.4 01/23/2018   CL 104 01/23/2018   CREATININE 0.69 01/23/2018   BUN 15 01/23/2018   CO2 27 01/23/2018   TSH 3.15 01/23/2018   HGBA1C 5.9  01/23/2018    BP Readings from Last 3 Encounters:  04/30/19 116/78  02/16/18 138/82  01/23/18 128/76    Lab plan  Fasting today  No lipid med  ASSESSMENT AND PLAN:  Discussed the following assessment and plan:    ICD-10-CM   1. Multiple joint pain  99991111 Basic metabolic panel    CBC with Differential/Platelet    Hepatic function panel    Lipid panel    C-reactive protein    Sedimentation rate    Cyclic citrul peptide antibody, IgG    TSH  2. Hyperlipidemia, unspecified hyperlipidemia type  99991111 Basic metabolic panel    CBC with Differential/Platelet    Hepatic function panel    Lipid panel    C-reactive protein    Sedimentation rate    Cyclic citrul peptide antibody, IgG    TSH  3. Medication management  123456 Basic metabolic panel    CBC with Differential/Platelet    Hepatic function panel    Lipid panel    C-reactive protein    Sedimentation rate    Cyclic citrul peptide antibody, IgG    TSH   Return for depending on results and after   ortho plan . Get shingrix at pharmacy for coverage reasons. Await other  eval for shoulder and joint   If Celebrex indicated after eval etc  Consider trial with caution.  She is allergic to sulfa   Plan lab and  Patient Care Team: , Standley Brooking, MD as PCP - General (Internal Medicine) Patient Instructions   Get your  follow up with orthopedist and  Evaluation about joint and knees.  This is most likely  Osteoarthritis celebrex  Can be helpful but has  pentential side effects  .  Let mw know which cholesterol medication you tolerate best  .    Get shingrix at your pharmacy .  Plan  rov after    Evaluation for joint pain and we can decide how to help     Joint Pain Joint pain (arthralgia) may be caused by many things. Joint pain is likely to go away when you follow instructions from your health care provider for relieving pain at home. However, joint pain can also be caused by conditions that require more treatment.  Common causes of joint pain include:  Bruising in the area of the joint.  Injury caused by repeating certain movements too many times (overuse injury).  Age-related joint wear and tear (osteoarthritis).  Buildup of uric acid crystals in the joint (gout).  Inflammation of the joint (rheumatic disease).  Various other forms of arthritis.  Infections of the joint (septic arthritis) or of the bone (osteomyelitis). Your health care provider may recommend that you take pain medicine or wear a supportive device like an elastic bandage, sling, or splint. If your joint pain continues, you may need lab or imaging tests to diagnose the cause of your joint pain. Follow these instructions at home: Managing pain, stiffness, and swelling   If directed, put ice on the painful area. Icing can help to relieve joint pain and swelling. ? Put ice in a plastic bag. ? Place a towel between your skin and the bag. ? Leave the ice on for 20 minutes, 2-3 times a day.  If directed, apply heat to the painful area as often as told by your health care provider. Heat can reduce the stiffness of your muscles and joints. Use the heat source that your health care provider recommends, such as a moist heat pack or a heating pad. ? Place a towel between your skin and the heat source. ? Leave the heat on for 20-30 minutes. ? Remove the heat if your skin turns bright red. This is especially important if you are unable to feel pain, heat, or cold. You may have a greater risk of getting burned.  Move your fingers or toes below the painful joint often. You can avoid stiffness and lessen swelling by doing this.  If possible, raise (elevate) the painful joint above the level of your heart while you are sitting or lying down. To do this, try putting a few pillows under the painful joint. Activity  Rest the painful joint for as long as directed. Do not do anything that causes or worsens pain.  Begin exercising or stretching  the affected area, as told by your health care provider. Ask your health care provider what types of exercise are safe for you. If you have an elastic bandage, sling, or splint:  Wear the supportive device as told by your health care provider. Remove it only as told by your health care provider.  Loosen the device if your fingers or toes below the joint tingle, become numb, or turn cold and blue.  Keep the device clean.  Ask your health care provider if you should remove the device before bathing. You may need to cover it with a watertight covering when you take a bath or a shower. General instructions  Take over-the-counter and prescription medicines only as told by your health care provider.  Do not  use any products that contain nicotine or tobacco, such as cigarettes and e-cigarettes. If you need help quitting, ask your health care provider.  Keep all follow-up visits as told by your health care provider. This is important. Contact a health care provider if:  You have pain that gets worse and does not get better with medicine.  Your joint pain does not improve within 3 days.  You have increased bruising or swelling.  You have a fever.  You lose 10 lb (4.5 kg) or more without trying. Get help right away if:  You cannot move the joint.  Your fingers or toes tingle, become numb, or turn cold and blue.  You have a fever along with a joint that is red, warm, and swollen. Summary  Joint pain (arthralgia) may be caused by many things.  Your health care provider may recommend that you take pain medicine or wear a supportive device like an elastic bandage, sling, or splint.  If your joint pain continues, you may need tests to diagnose the cause of your joint pain.  Take over-the-counter and prescription medicines only as told by your health care provider. This information is not intended to replace advice given to you by your health care provider. Make sure you discuss any  questions you have with your health care provider. Document Revised: 12/17/2016 Document Reviewed: 10/20/2016 Elsevier Patient Education  2020 [place]  M.D.

## 2019-04-30 ENCOUNTER — Encounter: Payer: Self-pay | Admitting: Internal Medicine

## 2019-04-30 ENCOUNTER — Other Ambulatory Visit: Payer: Self-pay

## 2019-04-30 ENCOUNTER — Telehealth: Payer: Self-pay | Admitting: Internal Medicine

## 2019-04-30 ENCOUNTER — Ambulatory Visit (INDEPENDENT_AMBULATORY_CARE_PROVIDER_SITE_OTHER): Payer: Medicare Other | Admitting: Internal Medicine

## 2019-04-30 VITALS — BP 116/78 | HR 75 | Temp 98.0°F | Ht 65.25 in | Wt 158.2 lb

## 2019-04-30 DIAGNOSIS — Z79890 Hormone replacement therapy (postmenopausal): Secondary | ICD-10-CM

## 2019-04-30 DIAGNOSIS — E785 Hyperlipidemia, unspecified: Secondary | ICD-10-CM | POA: Diagnosis not present

## 2019-04-30 DIAGNOSIS — M2550 Pain in unspecified joint: Secondary | ICD-10-CM

## 2019-04-30 DIAGNOSIS — Z79899 Other long term (current) drug therapy: Secondary | ICD-10-CM | POA: Diagnosis not present

## 2019-04-30 LAB — C-REACTIVE PROTEIN: CRP: 1.9 mg/dL (ref 0.5–20.0)

## 2019-04-30 LAB — HEPATIC FUNCTION PANEL
ALT: 12 U/L (ref 0–35)
AST: 11 U/L (ref 0–37)
Albumin: 4 g/dL (ref 3.5–5.2)
Alkaline Phosphatase: 63 U/L (ref 39–117)
Bilirubin, Direct: 0.1 mg/dL (ref 0.0–0.3)
Total Bilirubin: 0.4 mg/dL (ref 0.2–1.2)
Total Protein: 6.8 g/dL (ref 6.0–8.3)

## 2019-04-30 LAB — LIPID PANEL
Cholesterol: 180 mg/dL (ref 0–200)
HDL: 37.4 mg/dL — ABNORMAL LOW (ref >39.00)
LDL Cholesterol: 121 mg/dL — ABNORMAL HIGH (ref 0–99)
NonHDL: 142.72
Total CHOL/HDL Ratio: 5
Triglycerides: 108 mg/dL (ref 0.0–149.0)
VLDL: 21.6 mg/dL (ref 0.0–40.0)

## 2019-04-30 LAB — CBC WITH DIFFERENTIAL/PLATELET
Basophils Absolute: 0.1 10*3/uL (ref 0.0–0.1)
Basophils Relative: 0.7 % (ref 0.0–3.0)
Eosinophils Absolute: 0.1 10*3/uL (ref 0.0–0.7)
Eosinophils Relative: 1.3 % (ref 0.0–5.0)
HCT: 37.9 % (ref 36.0–46.0)
Hemoglobin: 12.8 g/dL (ref 12.0–15.0)
Lymphocytes Relative: 17.3 % (ref 12.0–46.0)
Lymphs Abs: 1.5 10*3/uL (ref 0.7–4.0)
MCHC: 33.8 g/dL (ref 30.0–36.0)
MCV: 89.9 fl (ref 78.0–100.0)
Monocytes Absolute: 0.9 10*3/uL (ref 0.1–1.0)
Monocytes Relative: 9.5 % (ref 3.0–12.0)
Neutro Abs: 6.4 10*3/uL (ref 1.4–7.7)
Neutrophils Relative %: 71.2 % (ref 43.0–77.0)
Platelets: 329 10*3/uL (ref 150.0–400.0)
RBC: 4.22 Mil/uL (ref 3.87–5.11)
RDW: 13.8 % (ref 11.5–15.5)
WBC: 8.9 10*3/uL (ref 4.0–10.5)

## 2019-04-30 LAB — BASIC METABOLIC PANEL
BUN: 16 mg/dL (ref 6–23)
CO2: 27 mEq/L (ref 19–32)
Calcium: 9.2 mg/dL (ref 8.4–10.5)
Chloride: 104 mEq/L (ref 96–112)
Creatinine, Ser: 0.54 mg/dL (ref 0.40–1.20)
GFR: 109 mL/min (ref >60.00)
Glucose, Bld: 91 mg/dL (ref 70–99)
Potassium: 4.2 mEq/L (ref 3.5–5.1)
Sodium: 139 mEq/L (ref 135–145)

## 2019-04-30 LAB — TSH: TSH: 3.31 u[IU]/mL (ref 0.35–4.50)

## 2019-04-30 LAB — SEDIMENTATION RATE: Sed Rate: 59 mm/hr — ABNORMAL HIGH (ref 0–30)

## 2019-04-30 NOTE — Telephone Encounter (Signed)
Pt is calling back in stating that the medication lovaza (omega 3 acid ethyl esters) is the medication that she would like to start back on.  If Dr. Regis Bill want to speak with her about it she can call her back, but this is the medication that she could not think of the name of when in the office earlier today.

## 2019-04-30 NOTE — Patient Instructions (Addendum)
Get your follow up with orthopedist and  Evaluation about joint and knees.  This is most likely  Osteoarthritis celebrex  Can be helpful but has  pentential side effects  .  Let mw know which cholesterol medication you tolerate best  .    Get shingrix at your pharmacy .  Plan  rov after    Evaluation for joint pain and we can decide how to help     Joint Pain Joint pain (arthralgia) may be caused by many things. Joint pain is likely to go away when you follow instructions from your health care provider for relieving pain at home. However, joint pain can also be caused by conditions that require more treatment. Common causes of joint pain include:  Bruising in the area of the joint.  Injury caused by repeating certain movements too many times (overuse injury).  Age-related joint wear and tear (osteoarthritis).  Buildup of uric acid crystals in the joint (gout).  Inflammation of the joint (rheumatic disease).  Various other forms of arthritis.  Infections of the joint (septic arthritis) or of the bone (osteomyelitis). Your health care provider may recommend that you take pain medicine or wear a supportive device like an elastic bandage, sling, or splint. If your joint pain continues, you may need lab or imaging tests to diagnose the cause of your joint pain. Follow these instructions at home: Managing pain, stiffness, and swelling   If directed, put ice on the painful area. Icing can help to relieve joint pain and swelling. ? Put ice in a plastic bag. ? Place a towel between your skin and the bag. ? Leave the ice on for 20 minutes, 2-3 times a day.  If directed, apply heat to the painful area as often as told by your health care provider. Heat can reduce the stiffness of your muscles and joints. Use the heat source that your health care provider recommends, such as a moist heat pack or a heating pad. ? Place a towel between your skin and the heat source. ? Leave the heat on  for 20-30 minutes. ? Remove the heat if your skin turns bright red. This is especially important if you are unable to feel pain, heat, or cold. You may have a greater risk of getting burned.  Move your fingers or toes below the painful joint often. You can avoid stiffness and lessen swelling by doing this.  If possible, raise (elevate) the painful joint above the level of your heart while you are sitting or lying down. To do this, try putting a few pillows under the painful joint. Activity  Rest the painful joint for as long as directed. Do not do anything that causes or worsens pain.  Begin exercising or stretching the affected area, as told by your health care provider. Ask your health care provider what types of exercise are safe for you. If you have an elastic bandage, sling, or splint:  Wear the supportive device as told by your health care provider. Remove it only as told by your health care provider.  Loosen the device if your fingers or toes below the joint tingle, become numb, or turn cold and blue.  Keep the device clean.  Ask your health care provider if you should remove the device before bathing. You may need to cover it with a watertight covering when you take a bath or a shower. General instructions  Take over-the-counter and prescription medicines only as told by your health care provider.  Do  not use any products that contain nicotine or tobacco, such as cigarettes and e-cigarettes. If you need help quitting, ask your health care provider.  Keep all follow-up visits as told by your health care provider. This is important. Contact a health care provider if:  You have pain that gets worse and does not get better with medicine.  Your joint pain does not improve within 3 days.  You have increased bruising or swelling.  You have a fever.  You lose 10 lb (4.5 kg) or more without trying. Get help right away if:  You cannot move the joint.  Your fingers or toes  tingle, become numb, or turn cold and blue.  You have a fever along with a joint that is red, warm, and swollen. Summary  Joint pain (arthralgia) may be caused by many things.  Your health care provider may recommend that you take pain medicine or wear a supportive device like an elastic bandage, sling, or splint.  If your joint pain continues, you may need tests to diagnose the cause of your joint pain.  Take over-the-counter and prescription medicines only as told by your health care provider. This information is not intended to replace advice given to you by your health care provider. Make sure you discuss any questions you have with your health care provider. Document Revised: 12/17/2016 Document Reviewed: 10/20/2016 Elsevier Patient Education  [place].

## 2019-04-30 NOTE — Telephone Encounter (Signed)
Please see message. °

## 2019-05-01 LAB — CYCLIC CITRUL PEPTIDE ANTIBODY, IGG: Cyclic Citrullin Peptide Ab: <16 UNITS

## 2019-05-07 ENCOUNTER — Other Ambulatory Visit: Payer: Self-pay

## 2019-05-07 MED ORDER — SERTRALINE HCL 50 MG PO TABS: 50.0000 mg | ORAL_TABLET | Freq: Every day | ORAL | 1 refills | Status: AC

## 2019-05-07 NOTE — Telephone Encounter (Signed)
Last OV 04/30/2019  Last filled 12/13/2018, # 30 with 0 refills.  Last instructions say to wean as directed. Is patient still taking this? Or OK to refill?

## 2019-05-08 ENCOUNTER — Telehealth: Payer: Self-pay | Admitting: Internal Medicine

## 2019-05-08 ENCOUNTER — Other Ambulatory Visit: Payer: Self-pay

## 2019-05-08 DIAGNOSIS — E7800 Pure hypercholesterolemia, unspecified: Secondary | ICD-10-CM

## 2019-05-08 MED ORDER — EZETIMIBE 10 MG PO TABS: 10.0000 mg | ORAL_TABLET | Freq: Every day | ORAL | 1 refills | Status: DC

## 2019-05-08 NOTE — Telephone Encounter (Signed)
Yes  10 mg per day zetia  Refill enough for 6 months  Plan repeat lipid panel 3-4 month after  Beginning meds

## 2019-05-08 NOTE — Telephone Encounter (Signed)
Pt would like a call to go over results and has some information about medication she use to take. Pt will be home all day today.

## 2019-05-08 NOTE — Telephone Encounter (Signed)
So lovaza is a fish oil based medication used for  Elevated triglycerides  ( not a statin)   A  newer version of this is  Vascepa.  But     Your triglyceride were normal  this time   Would not add these medications at this time. However if you want would try the Zetia again( not a statin)  As this can lower the   Bad cholesterol. (LDL)  Continue lifestyle intervention healthy eating and activity  Can help the cholesterol level

## 2019-05-08 NOTE — Telephone Encounter (Signed)
Called patient and let her know that I have scheduled her lab appointment on 8/2 at 10:20am and I have ordered the Lipid. I have also sent in her Zetia Rx to the [facility]. Patient verbalized an understanding.

## 2019-05-08 NOTE — Progress Notes (Signed)
So blood work is pretty good , thyroid blood sugar liver and blood count all good ,cholesterol is better than in past although good cholesterol is still  low side . Triglycerides are now in range .  Let me know how  joint problems are  after   evaluation by the orthopedist  May be in a month or so .  Or as needed ( also see hone message about lipid med)

## 2019-05-08 NOTE — Telephone Encounter (Signed)
Called patient and gave her lab results and discussed medication. Patient verbalized an understanding.

## 2019-05-08 NOTE — Telephone Encounter (Signed)
Called patient and went over message and she stated that she will try Zetia. Please advise if still 10 mg daily. She wants this sent to [facility] at [place].

## 2019-05-09 DIAGNOSIS — M25561 Pain in right knee: Secondary | ICD-10-CM | POA: Diagnosis not present

## 2019-05-09 DIAGNOSIS — M67912 Unspecified disorder of synovium and tendon, left shoulder: Secondary | ICD-10-CM | POA: Diagnosis not present

## 2019-05-09 MED ORDER — PREMPRO 0.3-1.5 MG PO TABS: ORAL_TABLET | ORAL | 0 refills | Status: AC

## 2019-05-16 ENCOUNTER — Encounter: Payer: Medicare Other | Admitting: Internal Medicine

## 2019-05-17 DIAGNOSIS — R531 Weakness: Secondary | ICD-10-CM | POA: Diagnosis not present

## 2019-05-17 DIAGNOSIS — M7542 Impingement syndrome of left shoulder: Secondary | ICD-10-CM | POA: Diagnosis not present

## 2019-05-17 DIAGNOSIS — M6281 Muscle weakness (generalized): Secondary | ICD-10-CM | POA: Diagnosis not present

## 2019-05-17 DIAGNOSIS — R262 Difficulty in walking, not elsewhere classified: Secondary | ICD-10-CM | POA: Diagnosis not present

## 2019-05-23 DIAGNOSIS — M7542 Impingement syndrome of left shoulder: Secondary | ICD-10-CM | POA: Diagnosis not present

## 2019-05-23 DIAGNOSIS — R531 Weakness: Secondary | ICD-10-CM | POA: Diagnosis not present

## 2019-05-23 DIAGNOSIS — M6281 Muscle weakness (generalized): Secondary | ICD-10-CM | POA: Diagnosis not present

## 2019-05-23 DIAGNOSIS — R262 Difficulty in walking, not elsewhere classified: Secondary | ICD-10-CM | POA: Diagnosis not present

## 2019-05-28 DIAGNOSIS — R262 Difficulty in walking, not elsewhere classified: Secondary | ICD-10-CM | POA: Diagnosis not present

## 2019-05-28 DIAGNOSIS — M6281 Muscle weakness (generalized): Secondary | ICD-10-CM | POA: Diagnosis not present

## 2019-05-28 DIAGNOSIS — M7542 Impingement syndrome of left shoulder: Secondary | ICD-10-CM | POA: Diagnosis not present

## 2019-05-28 DIAGNOSIS — R531 Weakness: Secondary | ICD-10-CM | POA: Diagnosis not present

## 2019-06-01 DIAGNOSIS — M6281 Muscle weakness (generalized): Secondary | ICD-10-CM | POA: Diagnosis not present

## 2019-06-01 DIAGNOSIS — M7542 Impingement syndrome of left shoulder: Secondary | ICD-10-CM | POA: Diagnosis not present

## 2019-06-01 DIAGNOSIS — R262 Difficulty in walking, not elsewhere classified: Secondary | ICD-10-CM | POA: Diagnosis not present

## 2019-06-01 DIAGNOSIS — R531 Weakness: Secondary | ICD-10-CM | POA: Diagnosis not present

## 2019-06-04 DIAGNOSIS — M6281 Muscle weakness (generalized): Secondary | ICD-10-CM | POA: Diagnosis not present

## 2019-06-04 DIAGNOSIS — R262 Difficulty in walking, not elsewhere classified: Secondary | ICD-10-CM | POA: Diagnosis not present

## 2019-06-04 DIAGNOSIS — R531 Weakness: Secondary | ICD-10-CM | POA: Diagnosis not present

## 2019-06-04 DIAGNOSIS — M7542 Impingement syndrome of left shoulder: Secondary | ICD-10-CM | POA: Diagnosis not present

## 2019-06-06 DIAGNOSIS — M6281 Muscle weakness (generalized): Secondary | ICD-10-CM | POA: Diagnosis not present

## 2019-06-06 DIAGNOSIS — R531 Weakness: Secondary | ICD-10-CM | POA: Diagnosis not present

## 2019-06-06 DIAGNOSIS — M7542 Impingement syndrome of left shoulder: Secondary | ICD-10-CM | POA: Diagnosis not present

## 2019-06-06 DIAGNOSIS — M67912 Unspecified disorder of synovium and tendon, left shoulder: Secondary | ICD-10-CM | POA: Diagnosis not present

## 2019-06-06 DIAGNOSIS — R262 Difficulty in walking, not elsewhere classified: Secondary | ICD-10-CM | POA: Diagnosis not present

## 2019-06-06 DIAGNOSIS — M545 Low back pain: Secondary | ICD-10-CM | POA: Diagnosis not present

## 2019-06-11 DIAGNOSIS — M7542 Impingement syndrome of left shoulder: Secondary | ICD-10-CM | POA: Diagnosis not present

## 2019-06-11 DIAGNOSIS — R262 Difficulty in walking, not elsewhere classified: Secondary | ICD-10-CM | POA: Diagnosis not present

## 2019-06-11 DIAGNOSIS — R531 Weakness: Secondary | ICD-10-CM | POA: Diagnosis not present

## 2019-06-11 DIAGNOSIS — M6281 Muscle weakness (generalized): Secondary | ICD-10-CM | POA: Diagnosis not present

## 2019-06-12 DIAGNOSIS — M545 Low back pain: Secondary | ICD-10-CM | POA: Diagnosis not present

## 2019-06-13 DIAGNOSIS — R262 Difficulty in walking, not elsewhere classified: Secondary | ICD-10-CM | POA: Diagnosis not present

## 2019-06-13 DIAGNOSIS — M7542 Impingement syndrome of left shoulder: Secondary | ICD-10-CM | POA: Diagnosis not present

## 2019-06-13 DIAGNOSIS — R531 Weakness: Secondary | ICD-10-CM | POA: Diagnosis not present

## 2019-06-13 DIAGNOSIS — M6281 Muscle weakness (generalized): Secondary | ICD-10-CM | POA: Diagnosis not present

## 2019-06-20 DIAGNOSIS — M25551 Pain in right hip: Secondary | ICD-10-CM | POA: Diagnosis not present

## 2019-06-20 DIAGNOSIS — M7542 Impingement syndrome of left shoulder: Secondary | ICD-10-CM | POA: Diagnosis not present

## 2019-06-20 DIAGNOSIS — E781 Pure hyperglyceridemia: Secondary | ICD-10-CM | POA: Diagnosis not present

## 2019-06-20 DIAGNOSIS — R531 Weakness: Secondary | ICD-10-CM | POA: Diagnosis not present

## 2019-06-20 DIAGNOSIS — M6281 Muscle weakness (generalized): Secondary | ICD-10-CM | POA: Diagnosis not present

## 2019-06-20 DIAGNOSIS — Z1331 Encounter for screening for depression: Secondary | ICD-10-CM | POA: Diagnosis not present

## 2019-06-20 DIAGNOSIS — N2889 Other specified disorders of kidney and ureter: Secondary | ICD-10-CM | POA: Diagnosis not present

## 2019-06-20 DIAGNOSIS — M25511 Pain in right shoulder: Secondary | ICD-10-CM | POA: Diagnosis not present

## 2019-06-20 DIAGNOSIS — R262 Difficulty in walking, not elsewhere classified: Secondary | ICD-10-CM | POA: Diagnosis not present

## 2019-06-21 DIAGNOSIS — C641 Malignant neoplasm of right kidney, except renal pelvis: Secondary | ICD-10-CM | POA: Diagnosis not present

## 2019-06-22 ENCOUNTER — Other Ambulatory Visit: Payer: Self-pay | Admitting: Internal Medicine

## 2019-06-22 DIAGNOSIS — N2889 Other specified disorders of kidney and ureter: Secondary | ICD-10-CM

## 2019-06-27 DIAGNOSIS — C641 Malignant neoplasm of right kidney, except renal pelvis: Secondary | ICD-10-CM | POA: Diagnosis not present

## 2019-07-10 IMAGING — US US EXTREM UP*L* LTD
1 series · 5 of 5 positions shown · non-contrast
Comparison: NONE

CLINICAL DATA: Swelling of the left ring finger.

EXAM:
ULTRASOUND LEFT UPPER EXTREMITY LIMITED
TECHNIQUE: Ultrasound examination of the upper extremity soft tissues was
performed in the area of clinical concern.

[Series 1: us extrem up*left* ltd · 0.03mm/px · 5 of 5 slices shown]
[im 1/5]
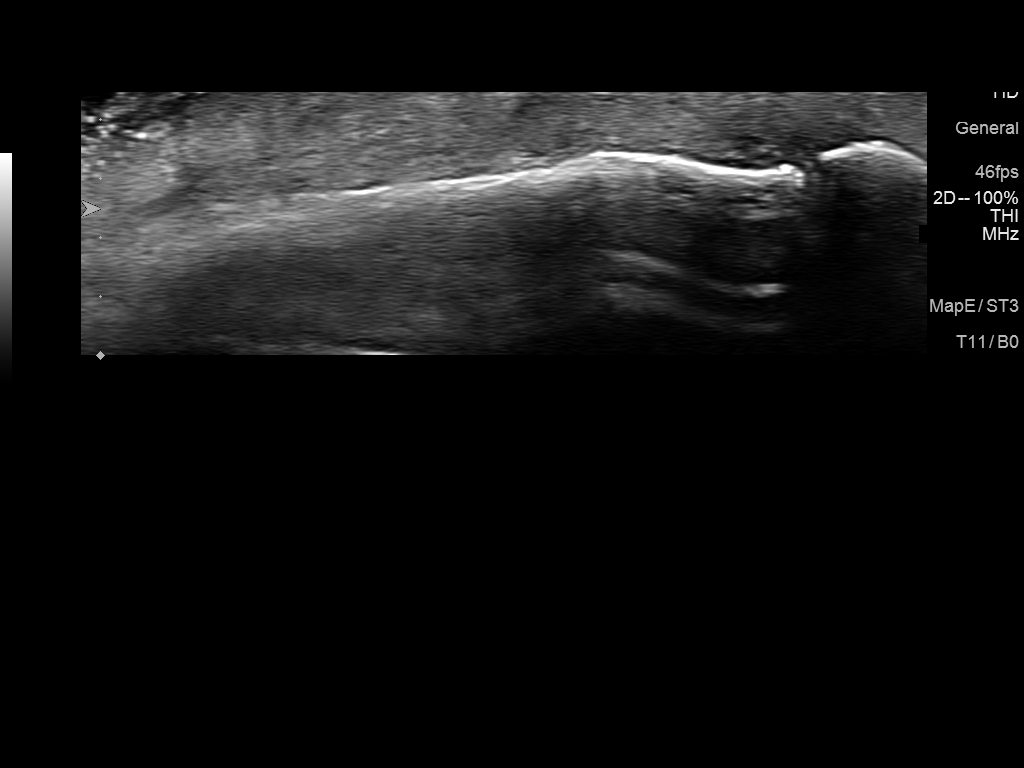
[im 2/5]
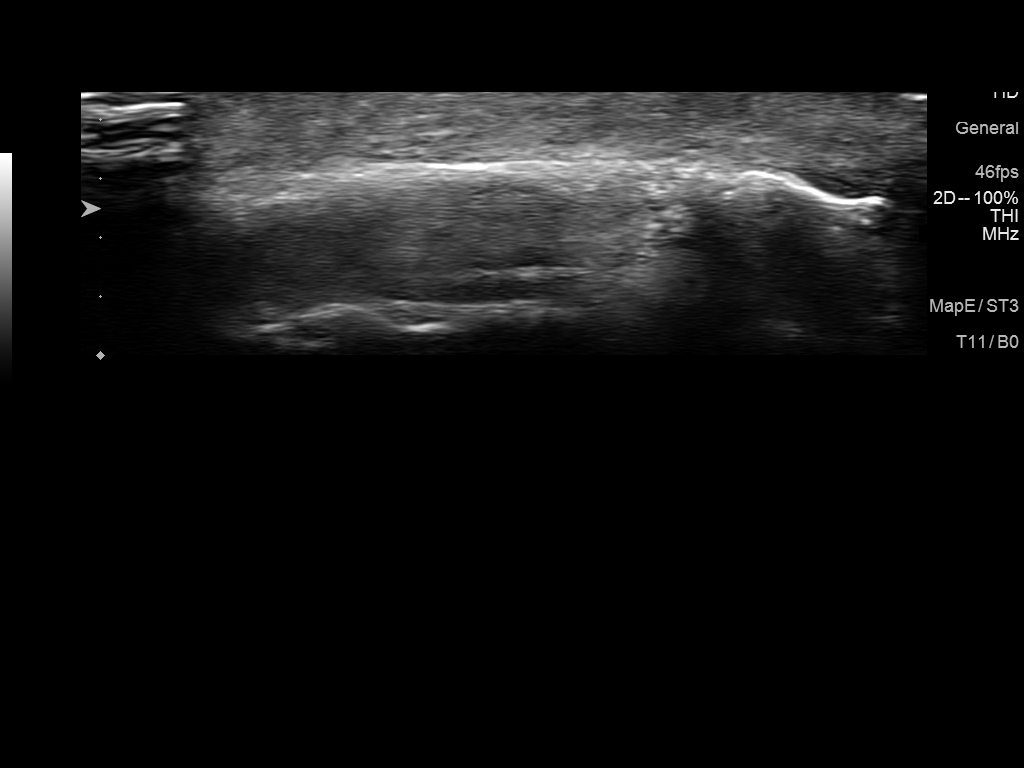
[im 3/5]
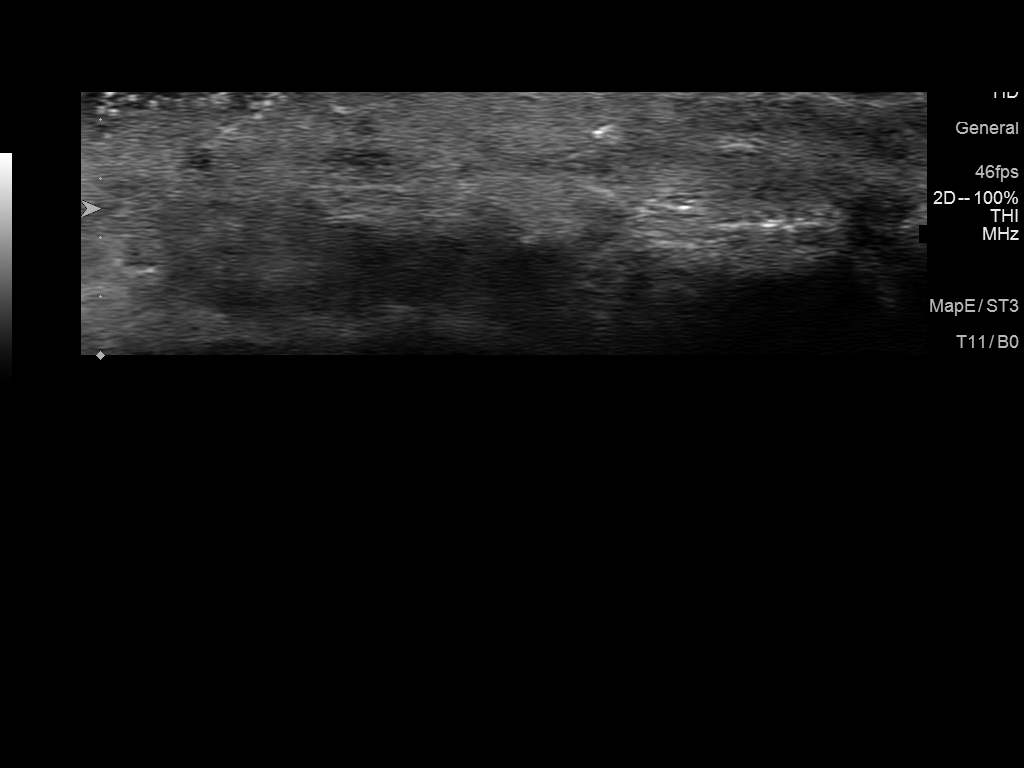
[im 4/5]
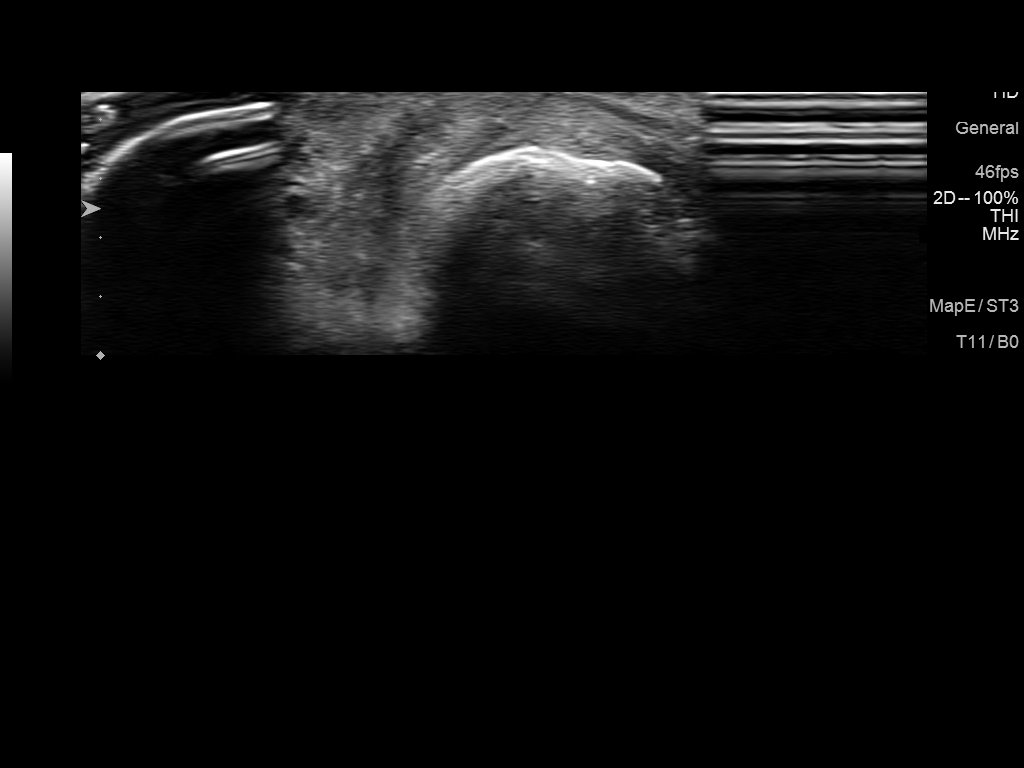
[im 5/5]
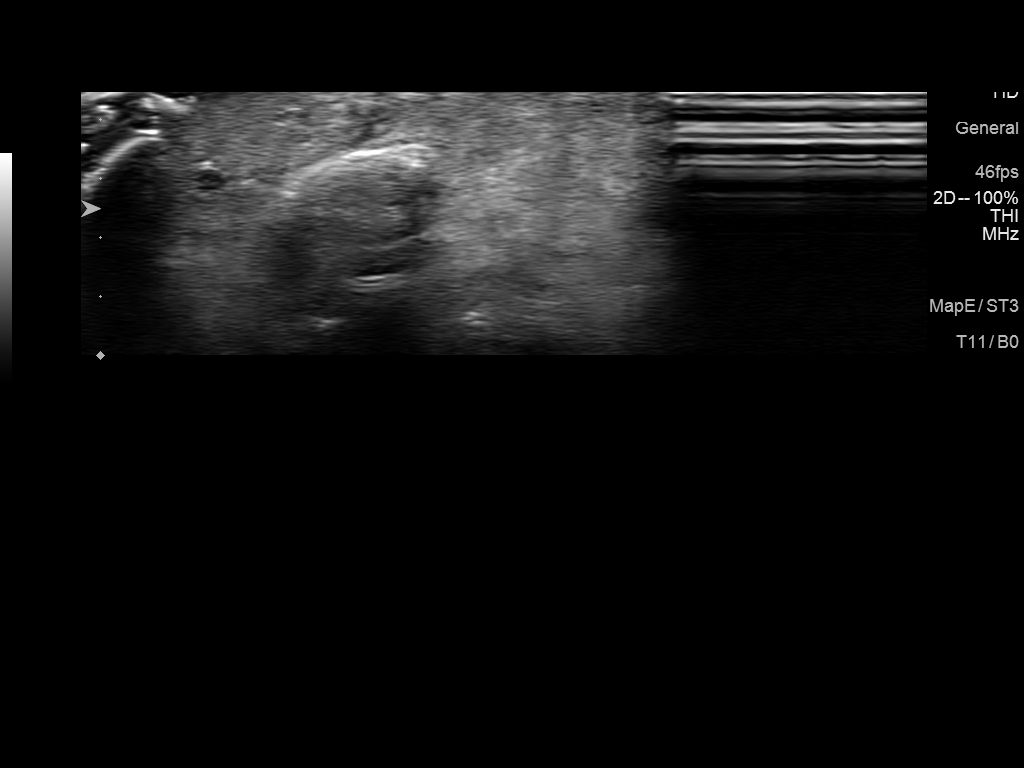

[5 of 5 positions shown; findings below may reference images not displayed]

FINDINGS: Real-time ultrasound was performed with the 18 megahertz transducer
over the left little finger in the area of swelling. There is no
evidence of a mass or of a foreign body or cyst. No appreciable
joint effusion.
IMPRESSION: Negative ultrasound of the left ring finger. Specifically, no
evidence of foreign body.

## 2019-07-12 ENCOUNTER — Other Ambulatory Visit: Payer: Self-pay | Admitting: Urology

## 2019-07-16 ENCOUNTER — Encounter (HOSPITAL_COMMUNITY)
Admission: RE | Admit: 2019-07-16 | Discharge: 2019-07-16 | Disposition: A | Discharge status: Home / Self Care | Payer: Medicare Other | Source: Ambulatory Visit | Attending: Urology | Admitting: Urology

## 2019-07-16 ENCOUNTER — Encounter (HOSPITAL_COMMUNITY): Payer: Self-pay

## 2019-07-16 ENCOUNTER — Other Ambulatory Visit: Payer: Self-pay

## 2019-07-16 DIAGNOSIS — Z01812 Encounter for preprocedural laboratory examination: Secondary | ICD-10-CM | POA: Diagnosis present

## 2019-07-16 HISTORY — DX: Depression, unspecified: F32.A

## 2019-07-16 HISTORY — DX: Unspecified osteoarthritis, unspecified site: M19.90

## 2019-07-16 HISTORY — DX: Malignant (primary) neoplasm, unspecified: C80.1

## 2019-07-16 LAB — CBC
HCT: 39.2 % (ref 36.0–46.0)
Hemoglobin: 12.5 g/dL (ref 12.0–15.0)
MCH: 29.5 pg (ref 26.0–34.0)
MCHC: 31.9 g/dL (ref 30.0–36.0)
MCV: 92.5 fL (ref 80.0–100.0)
Platelets: 302 10*3/uL (ref 150–400)
RBC: 4.24 MIL/uL (ref 3.87–5.11)
RDW: 14.3 % (ref 11.5–15.5)
WBC: 7.4 10*3/uL (ref 4.0–10.5)
nRBC: 0 % (ref 0.0–0.2)

## 2019-07-16 NOTE — Patient Instructions (Addendum)
DUE TO COVID-19 ONLY ONE VISITOR IS ALLOWED TO COME WITH YOU AND STAY IN THE WAITING ROOM ONLY DURING PRE OP AND PROCEDURE DAY OF SURGERY. THE 1 VISITOR MAY VISIT WITH YOU AFTER SURGERY IN YOUR PRIVATE ROOM DURING VISITING HOURS ONLY!  YOU NEED TO HAVE A COVID 19 TEST ON: 07/17/19 @ 2:00 pm, THIS TEST MUST BE DONE BEFORE SURGERY, COME  [place], [place] [place] , [address].  ([place]) ONCE YOUR COVID TEST IS COMPLETED, PLEASE BEGIN THE QUARANTINE INSTRUCTIONS AS OUTLINED IN YOUR HANDOUT.                Carlton Adam   Your procedure is scheduled on: 07/20/19   Report to [facility] Main  Entrance   Report to admitting at: 10:30 AM     Call this number if you have problems the morning of surgery [phone]    Remember: Do not solid eat food  :After Midnight. Clear liquids from midnight until: 9:30 am.    CLEAR LIQUID DIET   Foods Allowed                                                                     Foods Excluded  Coffee and tea, regular and decaf                             liquids that you cannot  Plain Jell-O any favor except red or purple                                           see through such as: Fruit ices (not with fruit pulp)                                     milk, soups, orange juice  Iced Popsicles                                    All solid food Carbonated beverages, regular and diet                                    Cranberry, grape and apple juices Sports drinks like Gatorade Lightly seasoned clear broth or consume(fat free) Sugar, honey syrup  Sample Menu Breakfast                                Lunch                                     Supper Cranberry juice                    Beef broth  Chicken broth Jell-O                                     Grape juice                           Apple juice Coffee or tea                        Jell-O                                      Popsicle                                                 Coffee or tea                        Coffee or tea  _____________________________________________________________________   BRUSH YOUR TEETH MORNING OF SURGERY AND RINSE YOUR MOUTH OUT, NO CHEWING GUM CANDY OR MINTS.     Take these medicines the morning of surgery with A SIP OF WATER: sertraline                                 You may not have any metal on your body including hair pins and              piercings  Do not wear jewelry, make-up, lotions, powders or perfumes, deodorant             Do not wear nail polish on your fingernails.  Do not shave  48 hours prior to surgery.            Do not bring valuables to the hospital. [place].  Contacts, dentures or bridgework may not be worn into surgery.  Leave suitcase in the car. After surgery it may be brought to your room.     Patients discharged the day of surgery will not be allowed to drive home. IF YOU ARE HAVING SURGERY AND GOING HOME THE SAME DAY, YOU MUST HAVE AN ADULT TO DRIVE YOU HOME AND BE WITH YOU FOR 24 HOURS. YOU MAY GO HOME BY TAXI OR UBER OR ORTHERWISE, BUT AN ADULT MUST ACCOMPANY YOU HOME AND STAY WITH YOU FOR 24 HOURS.  Name and phone number of your driver:  Special Instructions: N/A              Please read over the following fact sheets you were given: _____________________________________________________________________  [facility] - Preparing for Surgery Before surgery, you can play an important role.  Because skin is not sterile, your skin needs to be as free of germs as possible.  You can reduce the number of germs on your skin by washing with CHG (chlorahexidine gluconate) soap before surgery.  CHG is an antiseptic cleaner which kills germs and bonds with the skin to continue killing germs even after washing. Please DO NOT use if you have an allergy  to CHG or antibacterial soaps.  If your skin becomes reddened/irritated stop  using the CHG and inform your nurse when you arrive at Short Stay. Do not shave (including legs and underarms) for at least 48 hours prior to the first CHG shower.  You may shave your face/neck. Please follow these instructions carefully:  1.  Shower with CHG Soap the night before surgery and the  morning of Surgery.  2.  If you choose to wash your hair, wash your hair first as usual with your  normal  shampoo.  3.  After you shampoo, rinse your hair and body thoroughly to remove the  shampoo.                           4.  Use CHG as you would any other liquid soap.  You can apply chg directly  to the skin and wash                       Gently with a scrungie or clean washcloth.  5.  Apply the CHG Soap to your body ONLY FROM THE NECK DOWN.   Do not use on face/ open                           Wound or open sores. Avoid contact with eyes, ears mouth and genitals (private parts).                       Wash face,  Genitals (private parts) with your normal soap.             6.  Wash thoroughly, paying special attention to the area where your surgery  will be performed.  7.  Thoroughly rinse your body with warm water from the neck down.  8.  DO NOT shower/wash with your normal soap after using and rinsing off  the CHG Soap.                9.  Pat yourself dry with a clean towel.            10.  Wear clean pajamas.            11.  Place clean sheets on your bed the night of your first shower and do not  sleep with pets. Day of Surgery : Do not apply any lotions/deodorants the morning of surgery.  Please wear clean clothes to the hospital/surgery center.  FAILURE TO FOLLOW THESE INSTRUCTIONS MAY RESULT IN THE CANCELLATION OF YOUR SURGERY PATIENT SIGNATURE_________________________________  NURSE SIGNATURE__________________________________  ________________________________________________________________________

## 2019-07-16 NOTE — Progress Notes (Signed)
COVID Vaccine Completed:yes Date COVID Vaccine completed:03/25/19 COVID vaccine manufacturer: *[place]   PCP - Dr. Marton Redwood Cardiologist -   Chest x-ray -  EKG -  Stress Test -  ECHO -  Cardiac Cath -   Sleep Study -  CPAP -   Fasting Blood Sugar -  Checks Blood Sugar _____ times a day  Blood Thinner Instructions: Aspirin Instructions: Last Dose:  Anesthesia review:   Patient denies shortness of breath, fever, cough and chest pain at PAT appointment   Patient verbalized understanding of instructions that were given to them at the PAT appointment. Patient was also instructed that they will need to review over the PAT instructions again at home before surgery.

## 2019-07-17 ENCOUNTER — Other Ambulatory Visit (HOSPITAL_COMMUNITY)
Admission: RE | Admit: 2019-07-17 | Discharge: 2019-07-17 | Disposition: A | Discharge status: Home / Self Care | Payer: Medicare Other | Source: Ambulatory Visit | Attending: Urology | Admitting: Urology

## 2019-07-17 LAB — SARS CORONAVIRUS 2 (TAT 6-24 HRS): SARS Coronavirus 2: NEGATIVE

## 2019-07-20 ENCOUNTER — Encounter (HOSPITAL_COMMUNITY): Payer: Self-pay | Admitting: Urology

## 2019-07-20 ENCOUNTER — Other Ambulatory Visit: Payer: Self-pay

## 2019-07-20 ENCOUNTER — Encounter (HOSPITAL_COMMUNITY)
Admission: RE | Disposition: A | Discharge status: Home / Self Care | Payer: Self-pay | Source: Ambulatory Visit | Attending: Urology

## 2019-07-20 ENCOUNTER — Inpatient Hospital Stay (HOSPITAL_COMMUNITY)
Admission: RE | Admit: 2019-07-20 | Discharge: 2019-07-23 | DRG: 657 | Disposition: A | Discharge status: Home / Self Care | Payer: Medicare Other | Attending: Urology | Admitting: Urology

## 2019-07-20 ENCOUNTER — Inpatient Hospital Stay (HOSPITAL_COMMUNITY): Payer: Medicare Other | Admitting: Anesthesiology

## 2019-07-20 DIAGNOSIS — Z87891 Personal history of nicotine dependence: Secondary | ICD-10-CM

## 2019-07-20 DIAGNOSIS — Z6826 Body mass index (BMI) 26.0-26.9, adult: Secondary | ICD-10-CM | POA: Diagnosis not present

## 2019-07-20 DIAGNOSIS — M1990 Unspecified osteoarthritis, unspecified site: Secondary | ICD-10-CM | POA: Diagnosis present

## 2019-07-20 DIAGNOSIS — Z882 Allergy status to sulfonamides status: Secondary | ICD-10-CM | POA: Diagnosis not present

## 2019-07-20 DIAGNOSIS — E669 Obesity, unspecified: Secondary | ICD-10-CM | POA: Diagnosis present

## 2019-07-20 DIAGNOSIS — E785 Hyperlipidemia, unspecified: Secondary | ICD-10-CM | POA: Diagnosis present

## 2019-07-20 DIAGNOSIS — F419 Anxiety disorder, unspecified: Secondary | ICD-10-CM | POA: Diagnosis present

## 2019-07-20 DIAGNOSIS — Z79890 Hormone replacement therapy (postmenopausal): Secondary | ICD-10-CM | POA: Diagnosis not present

## 2019-07-20 DIAGNOSIS — M8580 Other specified disorders of bone density and structure, unspecified site: Secondary | ICD-10-CM | POA: Diagnosis present

## 2019-07-20 DIAGNOSIS — Z20822 Contact with and (suspected) exposure to covid-19: Secondary | ICD-10-CM | POA: Diagnosis present

## 2019-07-20 DIAGNOSIS — Q632 Ectopic kidney: Secondary | ICD-10-CM | POA: Diagnosis not present

## 2019-07-20 DIAGNOSIS — C641 Malignant neoplasm of right kidney, except renal pelvis: Secondary | ICD-10-CM | POA: Diagnosis present

## 2019-07-20 DIAGNOSIS — N2889 Other specified disorders of kidney and ureter: Secondary | ICD-10-CM | POA: Diagnosis present

## 2019-07-20 DIAGNOSIS — D62 Acute posthemorrhagic anemia: Secondary | ICD-10-CM | POA: Diagnosis not present

## 2019-07-20 DIAGNOSIS — Z79899 Other long term (current) drug therapy: Secondary | ICD-10-CM

## 2019-07-20 DIAGNOSIS — F329 Major depressive disorder, single episode, unspecified: Secondary | ICD-10-CM | POA: Diagnosis present

## 2019-07-20 DIAGNOSIS — Z888 Allergy status to other drugs, medicaments and biological substances status: Secondary | ICD-10-CM | POA: Diagnosis not present

## 2019-07-20 DIAGNOSIS — Z85528 Personal history of other malignant neoplasm of kidney: Secondary | ICD-10-CM

## 2019-07-20 HISTORY — PX: ROBOT ASSISTED LAPAROSCOPIC NEPHRECTOMY: SHX5140

## 2019-07-20 LAB — TYPE AND SCREEN
ABO/RH(D): A POS
Antibody Screen: NEGATIVE

## 2019-07-20 LAB — ABO/RH: ABO/RH(D): A POS

## 2019-07-20 LAB — HEMOGLOBIN AND HEMATOCRIT, BLOOD
HCT: 39.5 % (ref 36.0–46.0)
Hemoglobin: 12.5 g/dL (ref 12.0–15.0)

## 2019-07-20 SURGERY — NEPHRECTOMY, RADICAL, ROBOT-ASSISTED, LAPAROSCOPIC, ADULT
Anesthesia: General | Laterality: Right

## 2019-07-20 MED ORDER — PHENYLEPHRINE HCL-NACL 10-0.9 MG/250ML-% IV SOLN
INTRAVENOUS | Status: DC | PRN
Start: 2019-07-20 — End: 2019-07-20
  Administered 2019-07-20: 25 ug/min via INTRAVENOUS

## 2019-07-20 MED ORDER — SUGAMMADEX SODIUM 200 MG/2ML IV SOLN
INTRAVENOUS | Status: DC | PRN
  Administered 2019-07-20: 200 mg via INTRAVENOUS

## 2019-07-20 MED ORDER — KETAMINE HCL 10 MG/ML IJ SOLN
INTRAMUSCULAR | Status: AC
  Filled 2019-07-20: qty 1

## 2019-07-20 MED ORDER — MIDAZOLAM HCL 2 MG/2ML IJ SOLN
INTRAMUSCULAR | Status: AC
  Filled 2019-07-20: qty 2

## 2019-07-20 MED ORDER — DIPHENHYDRAMINE HCL 50 MG/ML IJ SOLN: 12.5000 mg | Freq: Four times a day (QID) | INTRAMUSCULAR | Status: DC | PRN

## 2019-07-20 MED ORDER — FENTANYL CITRATE (PF) 100 MCG/2ML IJ SOLN
INTRAMUSCULAR | Status: DC | PRN
  Administered 2019-07-20: 25 ug via INTRAVENOUS
  Administered 2019-07-20 (×2): 50 ug via INTRAVENOUS
  Administered 2019-07-20: 25 ug via INTRAVENOUS
  Administered 2019-07-20 (×2): 50 ug via INTRAVENOUS

## 2019-07-20 MED ORDER — PROPOFOL 10 MG/ML IV BOLUS
INTRAVENOUS | Status: DC | PRN
  Administered 2019-07-20: 140 mg via INTRAVENOUS
  Administered 2019-07-20: 160 mg via INTRAVENOUS

## 2019-07-20 MED ORDER — ONDANSETRON HCL 4 MG/2ML IJ SOLN
INTRAMUSCULAR | Status: DC | PRN
  Administered 2019-07-20: 4 mg via INTRAVENOUS

## 2019-07-20 MED ORDER — LACTATED RINGERS IR SOLN
Status: DC | PRN
  Administered 2019-07-20: 1000 mL

## 2019-07-20 MED ORDER — LACTATED RINGERS IV SOLN
INTRAVENOUS | Status: DC | PRN
Start: 2019-07-20 — End: 2019-07-20

## 2019-07-20 MED ORDER — ROCURONIUM BROMIDE 10 MG/ML (PF) SYRINGE
PREFILLED_SYRINGE | INTRAVENOUS | Status: DC | PRN
  Administered 2019-07-20: 40 mg via INTRAVENOUS
  Administered 2019-07-20 (×2): 10 mg via INTRAVENOUS
  Administered 2019-07-20: 20 mg via INTRAVENOUS

## 2019-07-20 MED ORDER — PROMETHAZINE HCL 25 MG/ML IJ SOLN: 6.2500 mg | Freq: Four times a day (QID) | INTRAMUSCULAR | Status: DC | PRN

## 2019-07-20 MED ORDER — HYDROCODONE-ACETAMINOPHEN 5-325 MG PO TABS: 1.0000 | ORAL_TABLET | Freq: Four times a day (QID) | ORAL | 0 refills | Status: AC | PRN

## 2019-07-20 MED ORDER — CHLORHEXIDINE GLUCONATE 0.12 % MT SOLN
15.0000 mL | Freq: Once | OROMUCOSAL | Status: AC
  Administered 2019-07-20: 15 mL via OROMUCOSAL

## 2019-07-20 MED ORDER — MORPHINE SULFATE (PF) 2 MG/ML IV SOLN
2.0000 mg | INTRAVENOUS | Status: DC | PRN
  Administered 2019-07-20 (×2): 2 mg via INTRAVENOUS
  Filled 2019-07-20 (×2): qty 1

## 2019-07-20 MED ORDER — SUCCINYLCHOLINE CHLORIDE 200 MG/10ML IV SOSY
PREFILLED_SYRINGE | INTRAVENOUS | Status: AC
  Filled 2019-07-20: qty 10

## 2019-07-20 MED ORDER — FENTANYL CITRATE (PF) 100 MCG/2ML IJ SOLN
INTRAMUSCULAR | Status: AC
  Filled 2019-07-20: qty 2

## 2019-07-20 MED ORDER — FENTANYL CITRATE (PF) 100 MCG/2ML IJ SOLN
50.0000 ug | INTRAMUSCULAR | Status: DC
  Administered 2019-07-20: 25 ug via INTRAVENOUS

## 2019-07-20 MED ORDER — ESMOLOL HCL 100 MG/10ML IV SOLN
INTRAVENOUS | Status: DC | PRN
  Administered 2019-07-20: 25 mg via INTRAVENOUS

## 2019-07-20 MED ORDER — SENNOSIDES-DOCUSATE SODIUM 8.6-50 MG PO TABS
1.0000 | ORAL_TABLET | Freq: Two times a day (BID) | ORAL | 0 refills | Status: AC
Start: 2019-07-20 — End: ?

## 2019-07-20 MED ORDER — MAGNESIUM CITRATE PO SOLN
1.0000 | Freq: Once | ORAL | Status: DC
  Filled 2019-07-20: qty 296

## 2019-07-20 MED ORDER — MEPERIDINE HCL 50 MG/ML IJ SOLN: 6.2500 mg | INTRAMUSCULAR | Status: DC | PRN

## 2019-07-20 MED ORDER — SUCCINYLCHOLINE CHLORIDE 200 MG/10ML IV SOSY
PREFILLED_SYRINGE | INTRAVENOUS | Status: DC | PRN
  Administered 2019-07-20: 140 mg via INTRAVENOUS

## 2019-07-20 MED ORDER — DEXAMETHASONE SODIUM PHOSPHATE 10 MG/ML IJ SOLN
INTRAMUSCULAR | Status: DC | PRN
  Administered 2019-07-20: 8 mg via INTRAVENOUS

## 2019-07-20 MED ORDER — STERILE WATER FOR IRRIGATION IR SOLN
Status: DC | PRN
  Administered 2019-07-20: 1000 mL

## 2019-07-20 MED ORDER — ONDANSETRON HCL 4 MG/2ML IJ SOLN
4.0000 mg | INTRAMUSCULAR | Status: DC | PRN
  Administered 2019-07-20: 4 mg via INTRAVENOUS
  Filled 2019-07-20: qty 2

## 2019-07-20 MED ORDER — HYDROMORPHONE HCL 1 MG/ML IJ SOLN: 0.2500 mg | INTRAMUSCULAR | Status: DC | PRN

## 2019-07-20 MED ORDER — ACETAMINOPHEN 10 MG/ML IV SOLN: 1000.0000 mg | Freq: Once | INTRAVENOUS | Status: DC | PRN

## 2019-07-20 MED ORDER — SERTRALINE HCL 50 MG PO TABS
50.0000 mg | ORAL_TABLET | Freq: Every day | ORAL | Status: DC
  Administered 2019-07-21 – 2019-07-23 (×3): 50 mg via ORAL
  Filled 2019-07-20 (×3): qty 1

## 2019-07-20 MED ORDER — CEFAZOLIN SODIUM-DEXTROSE 2-4 GM/100ML-% IV SOLN
2.0000 g | INTRAVENOUS | Status: AC
  Administered 2019-07-20: 2 g via INTRAVENOUS
  Filled 2019-07-20: qty 100

## 2019-07-20 MED ORDER — SODIUM CHLORIDE (PF) 0.9 % IJ SOLN
INTRAMUSCULAR | Status: DC | PRN
  Administered 2019-07-20: 20 mL

## 2019-07-20 MED ORDER — OXYCODONE HCL 5 MG PO TABS
5.0000 mg | ORAL_TABLET | ORAL | Status: DC | PRN
  Administered 2019-07-21 – 2019-07-23 (×8): 5 mg via ORAL
  Filled 2019-07-20 (×8): qty 1

## 2019-07-20 MED ORDER — LIDOCAINE HCL 2 % IJ SOLN
INTRAMUSCULAR | Status: AC
  Filled 2019-07-20: qty 20

## 2019-07-20 MED ORDER — ACETAMINOPHEN 10 MG/ML IV SOLN
INTRAVENOUS | Status: DC | PRN
  Administered 2019-07-20: 1000 mg via INTRAVENOUS

## 2019-07-20 MED ORDER — PROMETHAZINE HCL 25 MG/ML IJ SOLN
INTRAMUSCULAR | Status: AC
  Administered 2019-07-20: 6.25 mg via INTRAVENOUS
  Filled 2019-07-20: qty 1

## 2019-07-20 MED ORDER — SODIUM CHLORIDE (PF) 0.9 % IJ SOLN
INTRAMUSCULAR | Status: AC
  Filled 2019-07-20: qty 20

## 2019-07-20 MED ORDER — PROMETHAZINE HCL 25 MG/ML IJ SOLN: 6.2500 mg | Freq: Once | INTRAMUSCULAR | Status: AC

## 2019-07-20 MED ORDER — DEXTROSE-NACL 5-0.45 % IV SOLN: INTRAVENOUS | Status: DC

## 2019-07-20 MED ORDER — BUPIVACAINE LIPOSOME 1.3 % IJ SUSP
20.0000 mL | Freq: Once | INTRAMUSCULAR | Status: AC
  Administered 2019-07-20: 20 mL
  Filled 2019-07-20: qty 20

## 2019-07-20 MED ORDER — ACETAMINOPHEN 500 MG PO TABS
1000.0000 mg | ORAL_TABLET | Freq: Three times a day (TID) | ORAL | Status: DC
  Administered 2019-07-20 – 2019-07-23 (×8): 1000 mg via ORAL
  Filled 2019-07-20 (×8): qty 2

## 2019-07-20 MED ORDER — LABETALOL HCL 5 MG/ML IV SOLN
INTRAVENOUS | Status: DC | PRN
Start: 2019-07-20 — End: 2019-07-20
  Administered 2019-07-20 (×2): 2.5 mg via INTRAVENOUS

## 2019-07-20 MED ORDER — DIPHENHYDRAMINE HCL 12.5 MG/5ML PO ELIX: 12.5000 mg | ORAL_SOLUTION | Freq: Four times a day (QID) | ORAL | Status: DC | PRN

## 2019-07-20 MED ORDER — KETAMINE HCL 10 MG/ML IJ SOLN
INTRAMUSCULAR | Status: DC | PRN
Start: 2019-07-20 — End: 2019-07-20
  Administered 2019-07-20: 15 mg via INTRAVENOUS
  Administered 2019-07-20: 25 mg via INTRAVENOUS

## 2019-07-20 MED ORDER — LIDOCAINE 2% (20 MG/ML) 5 ML SYRINGE
INTRAMUSCULAR | Status: DC | PRN
  Administered 2019-07-20: 50 mg via INTRAVENOUS
  Administered 2019-07-20: 1 mg/kg/h via INTRAVENOUS

## 2019-07-20 MED ORDER — CEFAZOLIN SODIUM-DEXTROSE 1-4 GM/50ML-% IV SOLN
1.0000 g | Freq: Three times a day (TID) | INTRAVENOUS | Status: AC
  Administered 2019-07-20 – 2019-07-21 (×2): 1 g via INTRAVENOUS
  Filled 2019-07-20 (×2): qty 50

## 2019-07-20 MED ORDER — ORAL CARE MOUTH RINSE: 15.0000 mL | Freq: Once | OROMUCOSAL | Status: AC

## 2019-07-20 MED ORDER — MIDAZOLAM HCL 5 MG/5ML IJ SOLN
INTRAMUSCULAR | Status: DC | PRN
  Administered 2019-07-20: 1 mg via INTRAVENOUS

## 2019-07-20 MED ORDER — DOCUSATE SODIUM 100 MG PO CAPS
100.0000 mg | ORAL_CAPSULE | Freq: Two times a day (BID) | ORAL | Status: DC
  Administered 2019-07-20 – 2019-07-23 (×6): 100 mg via ORAL
  Filled 2019-07-20 (×6): qty 1

## 2019-07-20 MED ORDER — ACETAMINOPHEN 10 MG/ML IV SOLN
INTRAVENOUS | Status: AC
  Filled 2019-07-20: qty 100

## 2019-07-20 MED ORDER — LABETALOL HCL 5 MG/ML IV SOLN
INTRAVENOUS | Status: AC
  Filled 2019-07-20: qty 4

## 2019-07-20 MED ORDER — LACTATED RINGERS IV SOLN: INTRAVENOUS | Status: DC

## 2019-07-20 MED ORDER — PROMETHAZINE HCL 25 MG/ML IJ SOLN: 6.2500 mg | INTRAMUSCULAR | Status: DC | PRN

## 2019-07-20 SURGICAL SUPPLY — 62 items
ADH SKN CLS APL DERMABOND .7 (GAUZE/BANDAGES/DRESSINGS) ×1
APL PRP STRL LF DISP 70% ISPRP (MISCELLANEOUS) ×1
BAG LAPAROSCOPIC 12 15 PORT 16 (BASKET) ×1 IMPLANT
BAG RETRIEVAL 12/15 (BASKET) ×2
BAG RETRIEVAL 12/15MM (BASKET) ×1
CHLORAPREP W/TINT 26 (MISCELLANEOUS) ×3 IMPLANT
CLIP VESOLOCK LG 6/CT PURPLE (CLIP) ×3 IMPLANT
CLIP VESOLOCK MED LG 6/CT (CLIP) ×3 IMPLANT
CLIP VESOLOCK XL 6/CT (CLIP) ×3 IMPLANT
COVER SURGICAL LIGHT HANDLE (MISCELLANEOUS) ×3 IMPLANT
COVER TIP SHEARS 8 DVNC (MISCELLANEOUS) ×1 IMPLANT
COVER TIP SHEARS 8MM DA VINCI (MISCELLANEOUS) ×3
COVER WAND RF STERILE (DRAPES) ×3 IMPLANT
CUTTER ECHEON FLEX ENDO 45 340 (ENDOMECHANICALS) ×2 IMPLANT
DECANTER SPIKE VIAL GLASS SM (MISCELLANEOUS) ×3 IMPLANT
DERMABOND ADVANCED (GAUZE/BANDAGES/DRESSINGS) ×2
DERMABOND ADVANCED .7 DNX12 (GAUZE/BANDAGES/DRESSINGS) ×2 IMPLANT
DRAIN CHANNEL 15F RND FF 3/16 (WOUND CARE) IMPLANT
DRAPE ARM DVNC X/XI (DISPOSABLE) ×4 IMPLANT
DRAPE COLUMN DVNC XI (DISPOSABLE) ×1 IMPLANT
DRAPE DA VINCI XI ARM (DISPOSABLE) ×12
DRAPE DA VINCI XI COLUMN (DISPOSABLE) ×3
DRAPE INCISE IOBAN 66X45 STRL (DRAPES) ×3 IMPLANT
DRAPE SHEET LG 3/4 BI-LAMINATE (DRAPES) ×3 IMPLANT
ELECT PENCIL ROCKER SW 15FT (MISCELLANEOUS) ×2 IMPLANT
ELECT REM PT RETURN 15FT ADLT (MISCELLANEOUS) ×3 IMPLANT
EVACUATOR SILICONE 100CC (DRAIN) IMPLANT
GLOVE BIO SURGEON STRL SZ 6.5 (GLOVE) ×4 IMPLANT
GLOVE BIO SURGEONS STRL SZ 6.5 (GLOVE) ×2
GLOVE BIOGEL M STRL SZ7.5 (GLOVE) ×6 IMPLANT
GOWN STRL REUS W/TWL LRG LVL3 (GOWN DISPOSABLE) ×13 IMPLANT
IRRIG SUCT STRYKERFLOW 2 WTIP (MISCELLANEOUS) ×3
IRRIGATION SUCT STRKRFLW 2 WTP (MISCELLANEOUS) ×1 IMPLANT
KIT BASIN (CUSTOM PROCEDURE TRAY) ×3 IMPLANT
KIT TURNOVER KIT A (KITS) IMPLANT
LOOP VESSEL MAXI BLUE (MISCELLANEOUS) IMPLANT
NDL INSUFFLATION 14GA 120MM (NEEDLE) ×1 IMPLANT
NEEDLE INSUFFLATION 14GA 120MM (NEEDLE) ×3 IMPLANT
PENCIL SMOKE EVACUATOR (MISCELLANEOUS) IMPLANT
PORT ACCESS TROCAR AIRSEAL 12 (TROCAR) ×1 IMPLANT
PORT ACCESS TROCAR AIRSEAL 5M (TROCAR) ×2
PROTECTOR NERVE ULNAR (MISCELLANEOUS) ×9 IMPLANT
RELOAD STAPLE 45 2.6 WHT THIN (STAPLE) IMPLANT
SEAL CANN UNIV 5-8 DVNC XI (MISCELLANEOUS) ×4 IMPLANT
SEAL XI 5MM-8MM UNIVERSAL (MISCELLANEOUS) ×12
SET TRI-LUMEN FLTR TB AIRSEAL (TUBING) ×3 IMPLANT
SOLUTION ELECTROLUBE (MISCELLANEOUS) ×3 IMPLANT
SPONGE LAP 4X18 RFD (DISPOSABLE) ×3 IMPLANT
STAPLE RELOAD 45 WHT (STAPLE) ×4 IMPLANT
STAPLE RELOAD 45MM WHITE (STAPLE) ×12
SUT ETHILON 3 0 PS 1 (SUTURE) IMPLANT
SUT MNCRL AB 4-0 PS2 18 (SUTURE) ×6 IMPLANT
SUT PDS AB 1 CT1 27 (SUTURE) ×9 IMPLANT
SUT VICRYL 0 UR6 27IN ABS (SUTURE) IMPLANT
TOWEL OR 17X26 10 PK STRL BLUE (TOWEL DISPOSABLE) ×3 IMPLANT
TOWEL OR NON WOVEN STRL DISP B (DISPOSABLE) ×3 IMPLANT
TRAY FOLEY MTR SLVR 16FR STAT (SET/KITS/TRAYS/PACK) ×3 IMPLANT
TRAY LAPAROSCOPIC (CUSTOM PROCEDURE TRAY) ×3 IMPLANT
TROCAR BLADELESS OPT 5 100 (ENDOMECHANICALS) ×3 IMPLANT
TROCAR UNIVERSAL OPT 12M 100M (ENDOMECHANICALS) ×3 IMPLANT
TROCAR XCEL 12X100 BLDLESS (ENDOMECHANICALS) ×3 IMPLANT
WATER STERILE IRR 1000ML POUR (IV SOLUTION) ×3 IMPLANT

## 2019-07-20 NOTE — H&P (Signed)
Summer Collins is an 79 y.o. female.    Chief Complaint: Pre-op RIGHT radical nephrectomy.   HPI:   1 - RIGHT Renal Neoplasm - 9cm Rt lower / central mass. CT w/o chest lesiosn. 1 artery / 1 vein right renovascular anatomy. Cr <1.5.  PMH sig for mild obesity, carpel tunnel. NO ischemic CV disease / blood thinners. NO prior chest / abd surgery.  Today "Summer Collins" is seen to proceed with RIGHT robotic radical nephrecotmy for large mass. Hgb 12.5. C19 screen negative.   Past Medical History:  Diagnosis Date  . Anxiety     on low dose zoloft for a while  . Arthritis   . Cancer ([place])    kidneys  . Depression   . Hyperlipidemia   . Osteopenia   . Vitamin D deficiency     Past Surgical History:  Procedure Laterality Date  . CARPAL TUNNEL RELEASE    . DILATION AND CURETTAGE OF UTERUS     x 3    Family History  Problem Relation Age of Onset  . Cancer Father        prostate and spread   Social History:  reports that she has quit smoking. She has never used smokeless tobacco. She reports current alcohol use of about 1.0 standard drink of alcohol per week. She reports that she does not use drugs.  Allergies:  Allergies  Allergen Reactions  . Sulfa Antibiotics     ? As a child but unsure  . Tricor [Fenofibrate] Other (See Comments)    Myalgias     Medications Prior to Admission  Medication Sig Dispense Refill  . acetaminophen (TYLENOL) 650 MG CR tablet Take 1,300 mg by mouth every 8 (eight) hours as needed for pain.    . Calcium Carbonate-Vitamin D (CALCIUM 600 + D PO) Take 1 tablet by mouth in the morning and at bedtime.     Marland Kitchen estrogen, conjugated,-medroxyprogesterone (PREMPRO) 0.3-1.5 MG tablet TAKE 1 TABLET FOUR DAYS OUT OF THE WEEK. WEAN AS DIRECTED (Patient taking differently: Take 1 tablet by mouth every Monday, Wednesday, and Friday. ) 56 tablet 0  . Glucos-MSM-C-Mn-Ginger-Willow (GLUCOSAMINE MSM COMPLEX PO) Take 2 tablets by mouth daily.     . Omega-3 Fatty Acids (FISH  OIL) 1200 MG CAPS Take 1,200 mg by mouth in the morning and at bedtime.     Marland Kitchen OVER THE COUNTER MEDICATION Take 2 capsules by mouth in the morning and at bedtime. [employer] Triflex    . sertraline (ZOLOFT) 50 MG tablet Take 1 tablet (50 mg total) by mouth daily. Will take 25 mg as needed (Patient taking differently: Take 50 mg by mouth daily. ) 90 tablet 1  . TURMERIC CURCUMIN PO Take 1 capsule by mouth daily.    . vitamin B-12 (CYANOCOBALAMIN) 1000 MCG tablet Take 1,000 mcg by mouth daily.    Marland Kitchen zinc gluconate 50 MG tablet Take 50 mg by mouth daily.    . Cholecalciferol (VITAMIN D3) 1000 UNITS CAPS Take by mouth. (Patient not taking: Reported on 07/13/2019)    . Cyanocobalamin (VITAMIN B 12 PO) Take by mouth. 2542mcg daily (Patient not taking: Reported on 07/13/2019)    . econazole nitrate 1 % cream Apply 1 application topically 2 (two) times daily. (Patient not taking: Reported on 07/13/2019)    . ezetimibe (ZETIA) 10 MG tablet Take 1 tablet (10 mg total) by mouth daily. (Patient not taking: Reported on 07/13/2019) 90 tablet 1  . ketoconazole (NIZORAL) 2 % cream APPLY  A SMALL AMOUNT TO AFFECTED AREA ONCE A DAY (Patient not taking: Reported on 07/13/2019)    . mupirocin ointment (BACTROBAN) 2 % APPLY SMALL AMOUNT TOPICALLY TO THE AFFECTED AREA TWICE DAILY (Patient not taking: Reported on 07/13/2019)      No results found for this or any previous visit (from the past 48 hour(s)). No results found.  Review of Systems  Constitutional: Negative for chills and fever.  All other systems reviewed and are negative.   There were no vitals taken for this visit. Physical Exam Vitals reviewed.  HENT:     Head: Normocephalic.     Nose: Nose normal.     Mouth/Throat:     Mouth: Mucous membranes are moist.  Eyes:     Pupils: Pupils are equal, round, and reactive to light.  Cardiovascular:     Rate and Rhythm: Normal rate.     Pulses: Normal pulses.  Pulmonary:     Effort: Pulmonary effort is normal.   Abdominal:     General: Abdomen is flat.  Genitourinary:    Comments: NO CVAT at present.  Neurological:     General: No focal deficit present.     Mental Status: She is alert.  Psychiatric:        Mood and Affect: Mood normal.      Assessment/Plan  Proceed as planned with RIGHT robotic radical nephrectomy. Risks, benefits, expected peri-op course discussed previously and reiterated today.   Summer Frock, MD 07/20/2019, 10:31 AM

## 2019-07-20 NOTE — Transfer of Care (Signed)
Immediate Anesthesia Transfer of Care Note  Patient: Summer Collins  Procedure(s) Performed: XI ROBOTIC ASSISTED LAPAROSCOPIC RADICAL NEPHRECTOMY (Right )  Patient Location: PACU  Anesthesia Type:General  Level of Consciousness: awake, oriented, drowsy, patient cooperative and responds to stimulation  Airway & Oxygen Therapy: Patient Spontanous Breathing and Patient connected to face mask oxygen  Post-op Assessment: Report given to RN and Post -op Vital signs reviewed and stable  Post vital signs: Reviewed and stable  Last Vitals:  Vitals Value Taken Time  BP 201/102 07/20/19 1600  Temp    Pulse 67 07/20/19 1601  Resp 13 07/20/19 1601  SpO2 100 % 07/20/19 1601  Vitals shown include unvalidated device data.  Last Pain:  Vitals:   07/20/19 1058  TempSrc:   PainSc: 5          Complications: No complications documented.

## 2019-07-20 NOTE — Anesthesia Preprocedure Evaluation (Signed)
Anesthesia Evaluation  Patient identified by MRN, date of birth, ID band Patient awake    Reviewed: Allergy & Precautions, NPO status , Patient's Chart, lab work & pertinent test results  Airway Mallampati: I       Dental  (+) Teeth Intact   Pulmonary neg pulmonary ROS, former smoker,    Pulmonary exam normal        Cardiovascular negative cardio ROS Normal cardiovascular exam Rhythm:Regular Rate:Normal     Neuro/Psych PSYCHIATRIC DISORDERS Anxiety Depression negative neurological ROS     GI/Hepatic negative GI ROS, Neg liver ROS,   Endo/Other  negative endocrine ROS  Renal/GU negative Renal ROS  negative genitourinary   Musculoskeletal   Abdominal Normal abdominal exam  (+)   Peds negative pediatric ROS (+)  Hematology negative hematology ROS (+)   Anesthesia Other Findings   Reproductive/Obstetrics negative OB ROS                             Anesthesia Physical Anesthesia Plan  ASA: II  Anesthesia Plan: General   Post-op Pain Management:    Induction:   PONV Risk Score and Plan: 4 or greater and Ondansetron and Dexamethasone  Airway Management Planned: Oral ETT  Additional Equipment: None  Intra-op Plan:   Post-operative Plan: Extubation in OR  Informed Consent: I have reviewed the patients History and Physical, chart, labs and discussed the procedure including the risks, benefits and alternatives for the proposed anesthesia with the patient or authorized representative who has indicated his/her understanding and acceptance.     Dental advisory given  Plan Discussed with: CRNA  Anesthesia Plan Comments:         Anesthesia Quick Evaluation

## 2019-07-20 NOTE — Anesthesia Procedure Notes (Signed)
Procedure Name: Intubation Date/Time: 07/20/2019 12:59 PM Performed by: Silas Sacramento, CRNA Pre-anesthesia Checklist: Patient identified, Emergency Drugs available, Suction available and Patient being monitored Patient Re-evaluated:Patient Re-evaluated prior to induction Oxygen Delivery Method: Circle system utilized Preoxygenation: Pre-oxygenation with 100% oxygen Induction Type: IV induction, Rapid sequence and Cricoid Pressure applied Laryngoscope Size: Mac and 4 Grade View: Grade I Tube type: Oral Tube size: 7.0 mm Number of attempts: 1 Airway Equipment and Method: Stylet Placement Confirmation: ETT inserted through vocal cords under direct vision,  positive ETCO2 and breath sounds checked- equal and bilateral Secured at: 22 cm Tube secured with: Tape Dental Injury: Teeth and Oropharynx as per pre-operative assessment

## 2019-07-20 NOTE — Plan of Care (Signed)
Call bell and personal belongings were placed within reach. Patient will call for assistance when needing to get out of the bed.

## 2019-07-20 NOTE — Discharge Instructions (Signed)

## 2019-07-20 NOTE — Brief Op Note (Signed)
07/20/2019  3:41 PM  PATIENT:  Summer Collins  79 y.o. female  PRE-OPERATIVE DIAGNOSIS:  LARGE RIGHT RENAL MASS  POST-OPERATIVE DIAGNOSIS:  LARGE RIGHT RENAL MASS  PROCEDURE:  Procedure(s) with comments: XI ROBOTIC ASSISTED LAPAROSCOPIC RADICAL NEPHRECTOMY (Right) - 3 HRS  SURGEON:  Surgeon(s) and Role:    Alexis Frock, MD - Primary  PHYSICIAN ASSISTANT:   ASSISTANTS: Debbrah Alar PA   ANESTHESIA:   local and general  EBL:  150 mL   BLOOD ADMINISTERED:none  DRAINS: foley to gravity   LOCAL MEDICATIONS USED:  MARCAINE     SPECIMEN:  Source of Specimen:  right kidney + adrenal en bloc  DISPOSITION OF SPECIMEN:  PATHOLOGY  COUNTS:  YES  TOURNIQUET:  * No tourniquets in log *  DICTATION: .Other Dictation: Dictation Number  [id number]  PLAN OF CARE: Admit to inpatient   PATIENT DISPOSITION:  PACU - hemodynamically stable.   Delay start of Pharmacological VTE agent (>24hrs) due to surgical blood loss or risk of bleeding: yes

## 2019-07-21 ENCOUNTER — Other Ambulatory Visit: Payer: Self-pay

## 2019-07-21 LAB — BASIC METABOLIC PANEL
Anion gap: 7 (ref 5–15)
BUN: 13 mg/dL (ref 8–23)
CO2: 26 mmol/L (ref 22–32)
Calcium: 8.1 mg/dL — ABNORMAL LOW (ref 8.9–10.3)
Chloride: 102 mmol/L (ref 98–111)
Creatinine, Ser: 0.94 mg/dL (ref 0.44–1.00)
GFR calc Af Amer: >60 mL/min (ref >60)
GFR calc non Af Amer: 58 mL/min — ABNORMAL LOW (ref >60)
Glucose, Bld: 182 mg/dL — ABNORMAL HIGH (ref 70–99)
Potassium: 3.6 mmol/L (ref 3.5–5.1)
Sodium: 135 mmol/L (ref 135–145)

## 2019-07-21 LAB — HEMOGLOBIN AND HEMATOCRIT, BLOOD
HCT: 33.4 % — ABNORMAL LOW (ref 36.0–46.0)
Hemoglobin: 10.9 g/dL — ABNORMAL LOW (ref 12.0–15.0)

## 2019-07-21 MED ORDER — HYDROMORPHONE HCL 1 MG/ML IJ SOLN
1.0000 mg | Freq: Once | INTRAMUSCULAR | Status: AC
  Administered 2019-07-21: 1 mg via INTRAVENOUS
  Filled 2019-07-21: qty 1

## 2019-07-21 MED ORDER — BISACODYL 10 MG RE SUPP
10.0000 mg | Freq: Once | RECTAL | Status: AC
  Administered 2019-07-21: 10 mg via RECTAL
  Filled 2019-07-21: qty 1

## 2019-07-21 NOTE — Op Note (Signed)
NAME: Summer Collins, Summer Collins RECORD [id number] ACCOUNT [id number] DATE OF BIRTH:[birth year] FACILITY: [facility] LOCATION: [facility] PHYSICIAN:Ernestina Joe, MD  OPERATIVE REPORT  DATE OF PROCEDURE:  07/20/2019  PREOPERATIVE DIAGNOSIS:  Right renal mass.  PROCEDURE:  Right robotic radical nephrectomy.  ESTIMATED BLOOD LOSS:  100 mL.  COMPLICATIONS:  None.  SPECIMEN:  Right kidney plus adrenal plus proximal ureter en bloc.  FINDINGS: 1.  Single artery, single vein, small accessory vein, right renovascular anatomy. 2.  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  ASSISTANT:  Amanda L. [place], [place]  DRAINS:  Foley catheter to straight drain.  INDICATIONS:  The patient is a pleasant 79 year old lady who was found incidentally on spine MRI to have a large right renal mass approximately 9 cm chest imaging was unremarkable for distant disease.  There was no obvious locally advanced disease.   Contralateral kidney is normal as is her renal function.  Options were discussed for management including curative intent path with extubated surgery with radical nephrectomy being standard and she wished to proceed.  Informed consent was obtained and  placed in medical record.  DESCRIPTION OF PROCEDURE:  The patient was identified and procedure being a right radical nephrectomy was confirmed.  Procedure timeout was performed.  Intravenous administered.  General endotracheal anesthesia induced.  The patient was placed into a  right side up full flank position, pulling 15 degrees of table flexion superior arm elevator, axillary roll, sequential devices, bottom leg bent, top leg straight.  She was further fastened to operative table using 3-inch tape over foam padding across  the supraxiphoid chest and her pelvis and sterile field was created by prepping and draping the patient's right flank and abdomen using chlorhexidine gluconate.  Next high-flow, low-pressure pneumoperitoneum was  obtained using Veress technique, right  lower quadrant, having passed the aspiration and drop test.  An 8 mm robotic camera port was then placed in position approximately 1 handbreadth lateral to the umbilicus.  Laparoscopic examination showed no significant adhesions and no visceral injury.   Additional ports were placed as follows:  A 5 mm subxiphoid liver retraction port through which a self-locking grasper was used to elevate the inferior surface of the liver off the anterior surface Gerota's fascia.  An 8 mm subcostal robotic port, right  far lateral 8 mm robotic port approximately 4 fingerbreadths superomedial to interstitial iliac spine, right paramedian inferior robotic port approximately 1 handbreadth superior to pubic ramus and two 12 mm assistant port sites in the midline, one  approximately 2 fingerbreadths superior to the plane of the camera port and another 3 fingerbreadths inferior to the plane of the camera port.  Robot was docked and passed through electronic checks.  Initial attention was directed at developing the  retroperitoneum.  Incision was made lateral to the ascending colon from the area of the cecum towards the area of the hepatic flexure and the colon was carefully swept medially.  The duodenum was encountered and also kocherized medially such that it lay  medial to lateral surface of the inferior vena cava.  There was significant malrotation of the kidney as expected.  Approximately 70 degrees on the hilar axis lower pole kidney area was identified, placed on gentle lateral traction.  Dissection proceeded  medial to this.  The gonadal vessels were encountered and allowed to fall medially.  The ureter was encountered and placed on gentle lateral traction.  Dissection proceeded within the triangle of the ureter and psoas musculature towards the area  of the  renal hilum.  Renal hilum consisted of a single artery, single vein with a small lower accessory vein and renovascular anatomy  as anticipated.  The lower accessory vein was controlled using Hem-o-Lok clip proximal distal.  This was directly inferior to  the artery.  The artery trunk was circumferentially mobilized and controlled using an extra-large clip proximal followed by vascular stapler distal and the vein was controlled using vascular load stapler.  This resulted in excellent hemostatic control of  the hilum.  Given the large size, the renal mass was clearly felt that complete adrenal resection would be warranted.  As such, the plane between the inferior vena cava and the medial aspect of the adrenal was carefully developed using bipolar cautery  in a segmental fashion, which revealed excellent hemostasis of the medial plane.  Gentle inferior traction was applied to the kidney and adrenal and the superior attachments of the adrenal were taken down using vascular stapler.  Lateral attachments were  taken down using cautery scissors such that the adrenal plus kidney specimen was only tethered by the ureter, which was doubly clipped and ligated and the kidney plus ureter plus adrenal gland specimen was placed in EndoCatch bag for later retrieval.   Sponge, needle counts were correct.  Hemostasis appeared excellent.  We achieved the goals of surgery today.  Robot was then undocked.  Specimen was retrieved by connecting the 2 assistant port sites in the midline, removing nephrectomy specimen setting  aside for pathology.  The extraction site was closed with fascia using figure-of-eight PDS x6 followed by reapproximation of Scarpa's with running Vicryl.  All incision sites were infiltrated with dilute lipolyzed Marcaine and closed level skin using  subcuticular Monocryl by Dermabond.  The procedure terminated.  The patient tolerated the procedure well.  No immediate complications.  The patient did postanesthesia care in stable condition with plan for inpatient admission.  Please note, first assistant Amanda L. Elisabeth Cara was crucial  for all portions of the surgery today.  She provided invaluable vascular clipping vascular stapling, specimen manipulation, retraction and general first assistance.  PN/NUANCE  D:07/20/2019 T:07/21/2019 JOB:[id number]

## 2019-07-21 NOTE — Progress Notes (Signed)
Patient ID: Summer Collins, female   DOB: 11-05-1940, 79 y.o.   MRN: [id number]  1 Day Post-Op Subjective: Pt doing well.  Pain is significant but tolerable with pain medication.  She ambulated last night but was painful.  Some nausea overnight.  No flatus.  Objective: Vital signs in last 24 hours: Temp:  [96.9 F (36.1 C)-98.6 F (37 C)] 98.3 F (36.8 C) (07/03 8478) Pulse Rate:  [65-85] 69 (07/03 0632) Resp:  [11-19] 19 (07/03 0632) BP: (133-201)/(11-102) 134/69 (07/03 0632) SpO2:  [93 %-99 %] 96 % (07/03 4128) Weight:  [72.6 kg] 72.6 kg (07/02 1058)  Intake/Output from previous day: 07/02 0701 - 07/03 0700 In: 4135.6 [I.V.:3835.6; IV Piggyback:300] Out: 575 [Urine:425; Blood:150] Intake/Output this shift: No intake/output data recorded.  Physical Exam:  General: Alert and oriented Abdomen: Soft, ND Incisions: C/D/I Ext: NT, No erythema  Lab Results: Recent Labs    07/20/19 1605 07/21/19 0431  HGB 12.5 10.9*  HCT 39.5 33.4*   BMET Recent Labs    07/21/19 0431  NA 135  K 3.6  CL 102  CO2 26  GLUCOSE 182*  BUN 13  CREATININE 0.94  CALCIUM 8.1*     Studies/Results: No results found.  Assessment/Plan: POD # 1 s/p right RAL radical nephrectomy - Ambulate, IS, DVT prophylaxis - Advance diet - D/C Foley - Oral pain medication - Monitor renal function - Do not anticipate she will be ready for D/C today based on poor ambulation status thus far but will re-evaluate later today   LOS: 1 day   Summer Collins 07/21/2019, 8:01 AM

## 2019-07-21 NOTE — Plan of Care (Signed)
  Problem: Education: Goal: Knowledge of the prescribed therapeutic regimen will improve Outcome: Progressing   Problem: Bowel/Gastric: Goal: Gastrointestinal status for postoperative course will improve Outcome: Progressing   Problem: Clinical Measurements: Goal: Postoperative complications will be avoided or minimized Outcome: Progressing   Problem: Respiratory: Goal: Ability to achieve and maintain a regular respiratory rate will improve Outcome: Progressing   Problem: Skin Integrity: Goal: Demonstration of wound healing without infection will improve Outcome: Progressing   Problem: Urinary Elimination: Goal: Ability to avoid or minimize complications of infection will improve Outcome: Progressing Goal: Ability to achieve and maintain urine output will improve Outcome: Progressing   Problem: Education: Goal: Knowledge of General Education information will improve Description: Including pain rating scale, medication(s)/side effects and non-pharmacologic comfort measures Outcome: Progressing   Problem: Health Behavior/Discharge Planning: Goal: Ability to manage health-related needs will improve Outcome: Progressing   Problem: Clinical Measurements: Goal: Ability to maintain clinical measurements within normal limits will improve Outcome: Progressing Goal: Will remain free from infection Outcome: Progressing Goal: Diagnostic test results will improve Outcome: Progressing Goal: Respiratory complications will improve Outcome: Progressing Goal: Cardiovascular complication will be avoided Outcome: Progressing   Problem: Activity: Goal: Risk for activity intolerance will decrease Outcome: Progressing   Problem: Nutrition: Goal: Adequate nutrition will be maintained Outcome: Progressing   Problem: Coping: Goal: Level of anxiety will decrease Outcome: Progressing   Problem: Elimination: Goal: Will not experience complications related to bowel motility Outcome:  Progressing Goal: Will not experience complications related to urinary retention Outcome: Progressing   Problem: Pain Managment: Goal: General experience of comfort will improve Outcome: Progressing   Problem: Safety: Goal: Ability to remain free from injury will improve Outcome: Progressing   Problem: Skin Integrity: Goal: Risk for impaired skin integrity will decrease Outcome: Progressing   

## 2019-07-21 NOTE — Progress Notes (Signed)
Diet was advanced to full liquids. Patient has tolerated diet, nausea/vominting denied. Will continue to monitor.

## 2019-07-21 NOTE — Progress Notes (Signed)
Patient ambulated 45 feet. Activity tolerated moderately well. After pt returned to room, pt reports worsening pain in abdomen 10/10. Provider contacted to assist in pain management. Will continue to monitor.

## 2019-07-21 NOTE — Evaluation (Signed)
Physical Therapy Evaluation Patient Details Name: Summer Collins MRN: [id number] DOB: 02-Jan-1941 Today's Date: 07/21/2019   History of Present Illness  Pt is 79 yo female who was admitted due to R renal mass and is s/p R robotic radical nephrectomy on 07/20/19.  Pt with hx of anxiety, arthritis, and hyperlipidemia.  Clinical Impression  Pt admitted with above diagnosis. Pt is pleasant and motivated female who was independent at baseline.  Today she was mostly limited due to pain from surgery.  Session focused on providing transfer and gait techniques to assist with pain control - pt given handout with tips for transfers.  Pt required min A , cues, and increased time for transfers and ambulation.  Encouraged ambulation with nursing in addition to PT.  Pt currently with functional limitations due to the deficits listed below (see PT Problem List). Pt will benefit from skilled PT to increase their independence and safety with mobility to allow discharge to the venue listed below.       Follow Up Recommendations No PT follow up    Equipment Recommendations  Cane    Recommendations for Other Services       Precautions / Restrictions Precautions Precautions: Fall      Mobility  Bed Mobility Overal bed mobility: Needs Assistance Bed Mobility: Supine to Sit;Sit to Supine     Supine to sit: Min assist;HOB elevated Sit to supine: Min assist;HOB elevated   General bed mobility comments: increased time due to pain; cues for technique including small movements at a time and avoiding twisting for pain control  Transfers Overall transfer level: Needs assistance Equipment used: Rolling walker (2 wheeled) Transfers: Sit to/from Stand Sit to Stand: Min guard         General transfer comment: cues for transfer technique (leaning forward, foot placement, and safe placement of UE)  Ambulation/Gait Ambulation/Gait assistance: Min guard Gait Distance (Feet): 100 Feet Assistive device: IV  Pole;Rolling walker (2 wheeled) Gait Pattern/deviations: Step-through pattern;Shuffle;Wide base of support;Decreased stride length Gait velocity: decreased   General Gait Details: Started with RW progressed to IV pole; Pt with limited hip motion and decreased stride length, but was steady; use of lateral weight shfit to assist in advancing LE more than normal  Stairs            Wheelchair Mobility    Modified Rankin (Stroke Patients Only)       Balance Overall balance assessment: Needs assistance Sitting-balance support: No upper extremity supported Sitting balance-Leahy Scale: Good     Standing balance support: No upper extremity supported Standing balance-Leahy Scale: Fair Standing balance comment: Pt was able to ambulate with use of UE but reports feels safer with use of IV pole                             Pertinent Vitals/Pain Pain Assessment: Faces Faces Pain Scale: Hurts even more (at times with transitions) Pain Location: abdomen (none at rest) Pain Descriptors / Indicators: Discomfort;Sharp Pain Intervention(s): Limited activity within patient's tolerance;Monitored during session;Premedicated before session;Relaxation;Repositioned    Home Living Family/patient expects to be discharged to:: Private residence Living Arrangements: Spouse/significant other Available Help at Discharge: Family;Available PRN/intermittently Type of Home: House Home Access: Stairs to enter   Entrance Stairs-Number of Steps: 1 Home Layout: One level Home Equipment: Walker - 2 wheels;Shower seat - built in;Hand held shower head Additional Comments: RW- was her mothers - questionable condition    Prior Function  Level of Independence: Independent         Comments: She was going to outpt PT for hip and shoulder pain     Hand Dominance        Extremity/Trunk Assessment   Upper Extremity Assessment Upper Extremity Assessment: Overall WFL for tasks assessed     Lower Extremity Assessment Lower Extremity Assessment: Overall WFL for tasks assessed (Overall WFL but did not formally MMT due to abd pain from recent sx)    Cervical / Trunk Assessment Cervical / Trunk Assessment: Normal  Communication   Communication: No difficulties  Cognition Arousal/Alertness: Awake/alert Behavior During Therapy: WFL for tasks assessed/performed Overall Cognitive Status: Within Functional Limits for tasks assessed                                        General Comments General comments (skin integrity, edema, etc.): Pt reports chronic pain and weakness in shoulders and hips for which she was receiving outpt PT.   Discussed transfer techniques and that she could sleep in recliner initially if getting into/out of bed too difficult. Provided handout on transfer techniques.    Exercises     Assessment/Plan    PT Assessment Patient needs continued PT services  PT Problem List Decreased strength;Decreased range of motion;Decreased mobility;Decreased knowledge of precautions;Decreased activity tolerance;Decreased balance;Decreased knowledge of use of DME       PT Treatment Interventions DME instruction;Therapeutic activities;Gait training;Therapeutic exercise;Patient/family education;Balance training;Functional mobility training;Stair training    PT Goals (Current goals can be found in the Care Plan section)  Acute Rehab PT Goals Patient Stated Goal: decrease pain; return home and to independence PT Goal Formulation: With patient Time For Goal Achievement: 08/04/19 Potential to Achieve Goals: Good    Frequency Min 3X/week   Barriers to discharge        Co-evaluation               AM-PAC PT "6 Clicks" Mobility  Outcome Measure Help needed turning from your back to your side while in a flat bed without using bedrails?: A Little Help needed moving from lying on your back to sitting on the side of a flat bed without using  bedrails?: A Lot Help needed moving to and from a bed to a chair (including a wheelchair)?: A Little Help needed standing up from a chair using your arms (e.g., wheelchair or bedside chair)?: A Little Help needed to walk in hospital room?: A Little Help needed climbing 3-5 steps with a railing? : A Little 6 Click Score: 17    End of Session Equipment Utilized During Treatment: Gait belt Activity Tolerance: Patient tolerated treatment well Patient left: in bed;with call bell/phone within reach;with bed alarm set Nurse Communication: Mobility status PT Visit Diagnosis: Other abnormalities of gait and mobility (R26.89);Muscle weakness (generalized) (M62.81)    Time: 5631-4970 PT Time Calculation (min) (ACUTE ONLY): 40 min   Charges:   PT Evaluation $PT Eval Moderate Complexity: 1 Mod PT Treatments $Therapeutic Activity: 8-22 mins        Abran Richard, PT Acute Rehab Services Pager [phone] [facility] [place] 07/21/2019, 2:54 PM

## 2019-07-22 LAB — BASIC METABOLIC PANEL
Anion gap: 7 (ref 5–15)
BUN: 11 mg/dL (ref 8–23)
CO2: 24 mmol/L (ref 22–32)
Calcium: 8.1 mg/dL — ABNORMAL LOW (ref 8.9–10.3)
Chloride: 108 mmol/L (ref 98–111)
Creatinine, Ser: 1.04 mg/dL — ABNORMAL HIGH (ref 0.44–1.00)
GFR calc Af Amer: 60 mL/min — ABNORMAL LOW (ref >60)
GFR calc non Af Amer: 51 mL/min — ABNORMAL LOW (ref >60)
Glucose, Bld: 110 mg/dL — ABNORMAL HIGH (ref 70–99)
Potassium: 4.2 mmol/L (ref 3.5–5.1)
Sodium: 139 mmol/L (ref 135–145)

## 2019-07-22 NOTE — Progress Notes (Signed)
IV rate decrease to [facility] per MD request. Will continue to monitor. SRP, RN

## 2019-07-22 NOTE — Plan of Care (Signed)
  Problem: Education: Goal: Knowledge of the prescribed therapeutic regimen will improve Outcome: Progressing   Problem: Bowel/Gastric: Goal: Gastrointestinal status for postoperative course will improve Outcome: Progressing   Problem: Clinical Measurements: Goal: Postoperative complications will be avoided or minimized Outcome: Progressing   Problem: Respiratory: Goal: Ability to achieve and maintain a regular respiratory rate will improve Outcome: Progressing   Problem: Skin Integrity: Goal: Demonstration of wound healing without infection will improve Outcome: Progressing   Problem: Urinary Elimination: Goal: Ability to avoid or minimize complications of infection will improve Outcome: Progressing Goal: Ability to achieve and maintain urine output will improve Outcome: Progressing   Problem: Education: Goal: Knowledge of General Education information will improve Description: Including pain rating scale, medication(s)/side effects and non-pharmacologic comfort measures Outcome: Progressing   Problem: Health Behavior/Discharge Planning: Goal: Ability to manage health-related needs will improve Outcome: Progressing   Problem: Clinical Measurements: Goal: Ability to maintain clinical measurements within normal limits will improve Outcome: Progressing Goal: Will remain free from infection Outcome: Progressing Goal: Diagnostic test results will improve Outcome: Progressing Goal: Respiratory complications will improve Outcome: Progressing Goal: Cardiovascular complication will be avoided Outcome: Progressing   Problem: Activity: Goal: Risk for activity intolerance will decrease Outcome: Progressing   Problem: Nutrition: Goal: Adequate nutrition will be maintained Outcome: Progressing   Problem: Coping: Goal: Level of anxiety will decrease Outcome: Progressing   Problem: Elimination: Goal: Will not experience complications related to bowel motility Outcome:  Progressing Goal: Will not experience complications related to urinary retention Outcome: Progressing   Problem: Pain Managment: Goal: General experience of comfort will improve Outcome: Progressing   Problem: Safety: Goal: Ability to remain free from injury will improve Outcome: Progressing   Problem: Skin Integrity: Goal: Risk for impaired skin integrity will decrease Outcome: Progressing   

## 2019-07-22 NOTE — Progress Notes (Signed)
Patient ID: Carlton Adam, female   DOB: 1941/01/05, 79 y.o.   MRN: [id number]  2 Days Post-Op Subjective: Pt doing well but she is having difficulty getting out of bed unassisted and still has significant incisional pain.  No flatus.  Minimal results with dulcolax. PT has been started.  Objective: Vital signs in last 24 hours: Temp:  [97.8 F (36.6 C)-99.1 F (37.3 C)] 97.8 F (36.6 C) (07/04 0430) Pulse Rate:  [65-72] 72 (07/04 0430) Resp:  [16-18] 18 (07/04 0430) BP: (107-132)/(57-65) 132/64 (07/04 0430) SpO2:  [92 %-96 %] 94 % (07/04 0430) Weight:  [72.5 kg] 72.5 kg (07/03 1409)  Intake/Output from previous day: 07/03 0701 - 07/04 0700 In: 2631.5 [P.O.:650; I.V.:1981.5] Out: 1600  Intake/Output this shift: Total I/O In: 240 [P.O.:240] Out: -   Physical Exam:  General: Alert and oriented Lungs: CTA. CV: RRR Abdomen: Soft, ND, Moderate tender, BS+ Incisions: C/D/I Ext: NT, No erythema  Lab Results: Recent Labs    07/20/19 1605 07/21/19 0431  HGB 12.5 10.9*  HCT 39.5 33.4*   BMET Recent Labs    07/21/19 0431 07/22/19 0546  NA 135 139  K 3.6 4.2  CL 102 108  CO2 26 24  GLUCOSE 182* 110*  BUN 13 11  CREATININE 0.94 1.04*  CALCIUM 8.1* 8.1*     Studies/Results: No results found.  Assessment/Plan: POD # 2 s/p right RAL radical nephrectomy with mild acute blood loss anemia.  - Ambulate, IS, DVT prophylaxis - KVO IV - Oral pain medication - Monitor renal function - Do not anticipate she will be ready for D/C today based on poor ambulation status thus far but will re-evaluate tomorrow.  PT is helping.   LOS: 2 days   Irine Seal 07/22/2019, 10:32 AM  Patient ID: Carlton Adam, female   DOB: Sep 20, 1940, 79 y.o.   MRN: [id number]

## 2019-07-23 NOTE — Discharge Summary (Signed)
Physician Discharge Summary  Patient ID: Summer Collins MRN: [id number] DOB/AGE: Feb 27, 1940 79 y.o.  Admit date: 07/20/2019 Discharge date: 07/23/2019  Admission Diagnoses:  Right renal mass  Discharge Diagnoses:  Principal Problem:   Right renal mass   Past Medical History:  Diagnosis Date  . Anxiety     on low dose zoloft for a while  . Arthritis   . Cancer ([place])    kidneys  . Depression   . Hyperlipidemia   . Osteopenia   . Vitamin D deficiency     Surgeries: Procedure(s): XI ROBOTIC ASSISTED LAPAROSCOPIC RADICAL NEPHRECTOMY on 07/20/2019   Consultants (if any):   Discharged Condition: Improved  Hospital Course: ENAS WINCHEL is an 79 y.o. female who was admitted 07/20/2019 with a diagnosis of Right renal mass and went to the operating room on 07/20/2019 and underwent the above named procedures. Her postoperative course was uneventful but somewhat slow, but she is doing well today (7/5) and is anxious to go home. Path is pending.       She was given perioperative antibiotics:  Anti-infectives (From admission, onward)   Start     Dose/Rate Route Frequency Ordered Stop   07/20/19 2000  ceFAZolin (ANCEF) IVPB 1 g/50 mL premix        1 g 100 mL/hr over 30 Minutes Intravenous Every 8 hours 07/20/19 1745 07/21/19 0351   07/20/19 1039  ceFAZolin (ANCEF) IVPB 2g/100 mL premix        2 g 200 mL/hr over 30 Minutes Intravenous 30 min pre-op 07/20/19 1039 07/20/19 1315    .  She was given sequential compression devices for DVT prophylaxis.  She benefited maximally from the hospital stay and there were no complications.    Recent vital signs:  Vitals:   07/22/19 2048 07/23/19 0529  BP: (!) 146/74 (!) 165/63  Pulse: 78 72  Resp: 20 20  Temp: 98.4 F (36.9 C) 98.4 F (36.9 C)  SpO2: 92% 91%    Recent laboratory studies:  Lab Results  Component Value Date   HGB 10.9 (L) 07/21/2019   HGB 12.5 07/20/2019   HGB 12.5 07/16/2019   Lab Results  Component Value Date   WBC  7.4 07/16/2019   PLT 302 07/16/2019   No results found for: INR Lab Results  Component Value Date   NA 139 07/22/2019   K 4.2 07/22/2019   CL 108 07/22/2019   CO2 24 07/22/2019   BUN 11 07/22/2019   CREATININE 1.04 (H) 07/22/2019   GLUCOSE 110 (H) 07/22/2019    Discharge Medications:   Allergies as of 07/23/2019      Reactions   Sulfa Antibiotics    ? As a child but unsure   Tricor [fenofibrate] Other (See Comments)   Myalgias       Medication List    STOP taking these medications   acetaminophen 650 MG CR tablet Commonly known as: TYLENOL   ezetimibe 10 MG tablet Commonly known as: Zetia     TAKE these medications   CALCIUM 600 + D PO Take 1 tablet by mouth in the morning and at bedtime.   econazole nitrate 1 % cream Apply 1 application topically 2 (two) times daily.   Fish Oil 1200 MG Caps Take 1,200 mg by mouth in the morning and at bedtime.   GLUCOSAMINE MSM COMPLEX PO Take 2 tablets by mouth daily.   HYDROcodone-acetaminophen 5-325 MG tablet Commonly known as: NORCO/VICODIN Take 1-2 tablets by mouth every 6 (  six) hours as needed for moderate pain or severe pain. Post-operatively   ketoconazole 2 % cream Commonly known as: NIZORAL APPLY A SMALL AMOUNT TO AFFECTED AREA ONCE A DAY   mupirocin ointment 2 % Commonly known as: BACTROBAN APPLY SMALL AMOUNT TOPICALLY TO THE AFFECTED AREA TWICE DAILY   OVER THE COUNTER MEDICATION Take 2 capsules by mouth in the morning and at bedtime. [employer] Triflex   Prempro 0.3-1.5 MG tablet Generic drug: estrogen (conjugated)-medroxyprogesterone TAKE 1 TABLET FOUR DAYS OUT OF THE WEEK. WEAN AS DIRECTED What changed:   how much to take  how to take this  when to take this  additional instructions   senna-docusate 8.6-50 MG tablet Commonly known as: Senokot-S Take 1 tablet by mouth 2 (two) times daily. While taking strong pain meds to prevent constipation.   sertraline 50 MG tablet Commonly known as:  ZOLOFT Take 1 tablet (50 mg total) by mouth daily. Will take 25 mg as needed What changed: additional instructions   TURMERIC CURCUMIN PO Take 1 capsule by mouth daily.   vitamin B-12 1000 MCG tablet Commonly known as: CYANOCOBALAMIN Take 1,000 mcg by mouth daily.   VITAMIN B 12 PO Take by mouth. 2515mcg daily   Vitamin D3 25 MCG (1000 UT) Caps Take by mouth.   zinc gluconate 50 MG tablet Take 50 mg by mouth daily.       Diagnostic Studies: No results found.  Disposition: Discharge disposition: 01-Home or Self Care       Discharge Instructions    Discontinue IV   Complete by: As directed        Follow-up Information    Alexis Frock, MD On 08/02/2019.   Specialty: Urology Why: at 12:00 for MD visit and pathology review.  Contact information: [place] [address] [phone]                Signed: Irine Seal 07/23/2019, 9:57 AM

## 2019-07-23 NOTE — Care Management Important Message (Signed)
Important Message  Patient Details IM Letter presented to the Patient Name: Summer Collins MRN: [id number] Date of Birth: [birth year]   Medicare Important Message Given:  Yes     Kerin Salen 07/23/2019, 11:25 AM

## 2019-07-24 ENCOUNTER — Encounter (HOSPITAL_COMMUNITY): Payer: Self-pay | Admitting: Urology

## 2019-07-24 NOTE — Anesthesia Postprocedure Evaluation (Signed)
Anesthesia Post Note  Patient: Summer Collins  Procedure(s) Performed: XI ROBOTIC ASSISTED LAPAROSCOPIC RADICAL NEPHRECTOMY (Right )     Patient location during evaluation: PACU Anesthesia Type: General Level of consciousness: sedated Pain management: pain level controlled Vital Signs Assessment: post-procedure vital signs reviewed and stable Respiratory status: spontaneous breathing Cardiovascular status: stable Postop Assessment: no apparent nausea or vomiting Anesthetic complications: no   No complications documented.  Last Vitals:  Vitals:   07/22/19 2048 07/23/19 0529  BP: (!) 146/74 (!) 165/63  Pulse: 78 72  Resp: 20 20  Temp: 36.9 C 36.9 C  SpO2: 92% 91%    Last Pain:  Vitals:   07/23/19 1048  TempSrc:   PainSc: 0-No pain   Pain Goal: Patients Stated Pain Goal: 3 (07/22/19 2000)                 Huston Foley

## 2019-07-25 LAB — SURGICAL PATHOLOGY

## 2019-08-02 DIAGNOSIS — C641 Malignant neoplasm of right kidney, except renal pelvis: Secondary | ICD-10-CM | POA: Diagnosis not present

## 2019-08-06 DIAGNOSIS — M353 Polymyalgia rheumatica: Secondary | ICD-10-CM | POA: Diagnosis not present

## 2019-08-06 DIAGNOSIS — D3000 Benign neoplasm of unspecified kidney: Secondary | ICD-10-CM | POA: Diagnosis not present

## 2019-08-06 DIAGNOSIS — Z905 Acquired absence of kidney: Secondary | ICD-10-CM | POA: Diagnosis not present

## 2019-08-20 ENCOUNTER — Other Ambulatory Visit: Payer: Medicare Other

## 2019-09-03 DIAGNOSIS — M353 Polymyalgia rheumatica: Secondary | ICD-10-CM | POA: Diagnosis not present

## 2019-09-03 DIAGNOSIS — M2550 Pain in unspecified joint: Secondary | ICD-10-CM | POA: Diagnosis not present

## 2019-09-03 DIAGNOSIS — E663 Overweight: Secondary | ICD-10-CM | POA: Diagnosis not present

## 2019-09-03 DIAGNOSIS — Z6826 Body mass index (BMI) 26.0-26.9, adult: Secondary | ICD-10-CM | POA: Diagnosis not present

## 2019-09-05 DIAGNOSIS — Z905 Acquired absence of kidney: Secondary | ICD-10-CM | POA: Diagnosis not present

## 2019-09-05 DIAGNOSIS — M353 Polymyalgia rheumatica: Secondary | ICD-10-CM | POA: Diagnosis not present

## 2019-09-05 DIAGNOSIS — D3001 Benign neoplasm of right kidney: Secondary | ICD-10-CM | POA: Diagnosis not present

## 2019-10-15 DIAGNOSIS — E663 Overweight: Secondary | ICD-10-CM | POA: Diagnosis not present

## 2019-10-15 DIAGNOSIS — M2550 Pain in unspecified joint: Secondary | ICD-10-CM | POA: Diagnosis not present

## 2019-10-15 DIAGNOSIS — Z1231 Encounter for screening mammogram for malignant neoplasm of breast: Secondary | ICD-10-CM | POA: Diagnosis not present

## 2019-10-15 DIAGNOSIS — Z6827 Body mass index (BMI) 27.0-27.9, adult: Secondary | ICD-10-CM | POA: Diagnosis not present

## 2019-10-15 DIAGNOSIS — M353 Polymyalgia rheumatica: Secondary | ICD-10-CM | POA: Diagnosis not present

## 2019-10-24 DIAGNOSIS — B353 Tinea pedis: Secondary | ICD-10-CM | POA: Diagnosis not present

## 2019-10-24 DIAGNOSIS — L814 Other melanin hyperpigmentation: Secondary | ICD-10-CM | POA: Diagnosis not present

## 2019-10-24 DIAGNOSIS — L718 Other rosacea: Secondary | ICD-10-CM | POA: Diagnosis not present

## 2019-10-24 DIAGNOSIS — L821 Other seborrheic keratosis: Secondary | ICD-10-CM | POA: Diagnosis not present

## 2019-10-24 DIAGNOSIS — D692 Other nonthrombocytopenic purpura: Secondary | ICD-10-CM | POA: Diagnosis not present

## 2019-10-24 DIAGNOSIS — D1801 Hemangioma of skin and subcutaneous tissue: Secondary | ICD-10-CM | POA: Diagnosis not present

## 2019-10-24 DIAGNOSIS — D225 Melanocytic nevi of trunk: Secondary | ICD-10-CM | POA: Diagnosis not present

## 2019-11-03 DIAGNOSIS — Z23 Encounter for immunization: Secondary | ICD-10-CM | POA: Diagnosis not present

## 2019-12-17 DIAGNOSIS — E669 Obesity, unspecified: Secondary | ICD-10-CM | POA: Diagnosis not present

## 2019-12-17 DIAGNOSIS — M353 Polymyalgia rheumatica: Secondary | ICD-10-CM | POA: Diagnosis not present

## 2019-12-17 DIAGNOSIS — M2550 Pain in unspecified joint: Secondary | ICD-10-CM | POA: Diagnosis not present

## 2019-12-17 DIAGNOSIS — Z6828 Body mass index (BMI) 28.0-28.9, adult: Secondary | ICD-10-CM | POA: Diagnosis not present

## 2020-03-04 ENCOUNTER — Ambulatory Visit (HOSPITAL_COMMUNITY)
Admission: RE | Admit: 2020-03-04 | Discharge: 2020-03-04 | Disposition: A | Discharge status: Home / Self Care | Payer: Medicare Other | Source: Ambulatory Visit | Attending: Urology | Admitting: Urology

## 2020-03-04 ENCOUNTER — Other Ambulatory Visit: Payer: Self-pay

## 2020-03-04 ENCOUNTER — Other Ambulatory Visit (HOSPITAL_COMMUNITY): Payer: Self-pay | Admitting: Urology

## 2020-03-04 DIAGNOSIS — N2889 Other specified disorders of kidney and ureter: Secondary | ICD-10-CM | POA: Diagnosis not present

## 2020-03-04 DIAGNOSIS — C641 Malignant neoplasm of right kidney, except renal pelvis: Secondary | ICD-10-CM | POA: Diagnosis not present

## 2020-03-04 DIAGNOSIS — M4316 Spondylolisthesis, lumbar region: Secondary | ICD-10-CM | POA: Diagnosis not present

## 2020-03-04 DIAGNOSIS — M48061 Spinal stenosis, lumbar region without neurogenic claudication: Secondary | ICD-10-CM | POA: Diagnosis not present

## 2020-03-04 DIAGNOSIS — M4807 Spinal stenosis, lumbosacral region: Secondary | ICD-10-CM | POA: Diagnosis not present

## 2020-03-04 DIAGNOSIS — Z85528 Personal history of other malignant neoplasm of kidney: Secondary | ICD-10-CM | POA: Diagnosis not present

## 2020-03-11 DIAGNOSIS — C641 Malignant neoplasm of right kidney, except renal pelvis: Secondary | ICD-10-CM | POA: Diagnosis not present

## 2020-03-19 DIAGNOSIS — M353 Polymyalgia rheumatica: Secondary | ICD-10-CM | POA: Diagnosis not present

## 2020-03-19 DIAGNOSIS — M2550 Pain in unspecified joint: Secondary | ICD-10-CM | POA: Diagnosis not present

## 2020-03-19 DIAGNOSIS — Z6828 Body mass index (BMI) 28.0-28.9, adult: Secondary | ICD-10-CM | POA: Diagnosis not present

## 2020-03-19 DIAGNOSIS — E663 Overweight: Secondary | ICD-10-CM | POA: Diagnosis not present

## 2020-04-08 DIAGNOSIS — L648 Other androgenic alopecia: Secondary | ICD-10-CM | POA: Diagnosis not present

## 2020-04-08 DIAGNOSIS — L218 Other seborrheic dermatitis: Secondary | ICD-10-CM | POA: Diagnosis not present

## 2020-04-14 DIAGNOSIS — E782 Mixed hyperlipidemia: Secondary | ICD-10-CM | POA: Diagnosis not present

## 2020-04-23 DIAGNOSIS — M353 Polymyalgia rheumatica: Secondary | ICD-10-CM | POA: Diagnosis not present

## 2020-04-23 DIAGNOSIS — N1831 Chronic kidney disease, stage 3a: Secondary | ICD-10-CM | POA: Diagnosis not present

## 2020-04-23 DIAGNOSIS — Z0000 Encounter for general adult medical examination without abnormal findings: Secondary | ICD-10-CM | POA: Diagnosis not present

## 2020-04-23 DIAGNOSIS — Z1212 Encounter for screening for malignant neoplasm of rectum: Secondary | ICD-10-CM | POA: Diagnosis not present

## 2020-04-23 DIAGNOSIS — Z1339 Encounter for screening examination for other mental health and behavioral disorders: Secondary | ICD-10-CM | POA: Diagnosis not present

## 2020-04-23 DIAGNOSIS — M25551 Pain in right hip: Secondary | ICD-10-CM | POA: Diagnosis not present

## 2020-04-23 DIAGNOSIS — R7301 Impaired fasting glucose: Secondary | ICD-10-CM | POA: Diagnosis not present

## 2020-04-23 DIAGNOSIS — Z905 Acquired absence of kidney: Secondary | ICD-10-CM | POA: Diagnosis not present

## 2020-04-23 DIAGNOSIS — E782 Mixed hyperlipidemia: Secondary | ICD-10-CM | POA: Diagnosis not present

## 2020-04-23 DIAGNOSIS — Z1331 Encounter for screening for depression: Secondary | ICD-10-CM | POA: Diagnosis not present

## 2020-04-23 DIAGNOSIS — L650 Telogen effluvium: Secondary | ICD-10-CM | POA: Diagnosis not present

## 2020-04-23 DIAGNOSIS — R82998 Other abnormal findings in urine: Secondary | ICD-10-CM | POA: Diagnosis not present

## 2020-04-23 DIAGNOSIS — D692 Other nonthrombocytopenic purpura: Secondary | ICD-10-CM | POA: Diagnosis not present

## 2020-04-23 DIAGNOSIS — M8580 Other specified disorders of bone density and structure, unspecified site: Secondary | ICD-10-CM | POA: Diagnosis not present

## 2020-06-05 DIAGNOSIS — L820 Inflamed seborrheic keratosis: Secondary | ICD-10-CM | POA: Diagnosis not present

## 2020-06-05 DIAGNOSIS — L648 Other androgenic alopecia: Secondary | ICD-10-CM | POA: Diagnosis not present

## 2020-06-05 DIAGNOSIS — L218 Other seborrheic dermatitis: Secondary | ICD-10-CM | POA: Diagnosis not present

## 2020-06-17 DIAGNOSIS — Z6828 Body mass index (BMI) 28.0-28.9, adult: Secondary | ICD-10-CM | POA: Diagnosis not present

## 2020-06-17 DIAGNOSIS — E663 Overweight: Secondary | ICD-10-CM | POA: Diagnosis not present

## 2020-06-17 DIAGNOSIS — M353 Polymyalgia rheumatica: Secondary | ICD-10-CM | POA: Diagnosis not present

## 2020-06-17 DIAGNOSIS — M2550 Pain in unspecified joint: Secondary | ICD-10-CM | POA: Diagnosis not present

## 2020-08-12 DIAGNOSIS — S59902A Unspecified injury of left elbow, initial encounter: Secondary | ICD-10-CM | POA: Diagnosis not present

## 2020-08-12 DIAGNOSIS — S52135A Nondisplaced fracture of neck of left radius, initial encounter for closed fracture: Secondary | ICD-10-CM | POA: Diagnosis not present

## 2020-08-12 DIAGNOSIS — W19XXXA Unspecified fall, initial encounter: Secondary | ICD-10-CM | POA: Diagnosis not present

## 2020-08-13 DIAGNOSIS — S52135A Nondisplaced fracture of neck of left radius, initial encounter for closed fracture: Secondary | ICD-10-CM | POA: Diagnosis not present

## 2020-08-27 DIAGNOSIS — M25522 Pain in left elbow: Secondary | ICD-10-CM | POA: Diagnosis not present

## 2020-08-27 DIAGNOSIS — M25532 Pain in left wrist: Secondary | ICD-10-CM | POA: Diagnosis not present

## 2020-09-26 DIAGNOSIS — S52135A Nondisplaced fracture of neck of left radius, initial encounter for closed fracture: Secondary | ICD-10-CM | POA: Diagnosis not present

## 2020-10-15 DIAGNOSIS — Z780 Asymptomatic menopausal state: Secondary | ICD-10-CM | POA: Diagnosis not present

## 2020-10-15 DIAGNOSIS — M8589 Other specified disorders of bone density and structure, multiple sites: Secondary | ICD-10-CM | POA: Diagnosis not present

## 2020-10-15 DIAGNOSIS — Z1231 Encounter for screening mammogram for malignant neoplasm of breast: Secondary | ICD-10-CM | POA: Diagnosis not present

## 2020-10-17 DIAGNOSIS — M2550 Pain in unspecified joint: Secondary | ICD-10-CM | POA: Diagnosis not present

## 2020-10-17 DIAGNOSIS — E663 Overweight: Secondary | ICD-10-CM | POA: Diagnosis not present

## 2020-10-17 DIAGNOSIS — Z6828 Body mass index (BMI) 28.0-28.9, adult: Secondary | ICD-10-CM | POA: Diagnosis not present

## 2020-10-17 DIAGNOSIS — M353 Polymyalgia rheumatica: Secondary | ICD-10-CM | POA: Diagnosis not present

## 2020-10-29 DIAGNOSIS — R208 Other disturbances of skin sensation: Secondary | ICD-10-CM | POA: Diagnosis not present

## 2020-10-29 DIAGNOSIS — L218 Other seborrheic dermatitis: Secondary | ICD-10-CM | POA: Diagnosis not present

## 2020-10-29 DIAGNOSIS — Z85828 Personal history of other malignant neoplasm of skin: Secondary | ICD-10-CM | POA: Diagnosis not present

## 2020-10-29 DIAGNOSIS — D485 Neoplasm of uncertain behavior of skin: Secondary | ICD-10-CM | POA: Diagnosis not present

## 2020-10-29 DIAGNOSIS — L821 Other seborrheic keratosis: Secondary | ICD-10-CM | POA: Diagnosis not present

## 2020-10-29 DIAGNOSIS — L439 Lichen planus, unspecified: Secondary | ICD-10-CM | POA: Diagnosis not present

## 2020-10-29 DIAGNOSIS — L820 Inflamed seborrheic keratosis: Secondary | ICD-10-CM | POA: Diagnosis not present

## 2020-10-29 DIAGNOSIS — D225 Melanocytic nevi of trunk: Secondary | ICD-10-CM | POA: Diagnosis not present

## 2020-10-29 DIAGNOSIS — L814 Other melanin hyperpigmentation: Secondary | ICD-10-CM | POA: Diagnosis not present

## 2020-10-29 DIAGNOSIS — C44722 Squamous cell carcinoma of skin of right lower limb, including hip: Secondary | ICD-10-CM | POA: Diagnosis not present

## 2020-10-29 DIAGNOSIS — D2272 Melanocytic nevi of left lower limb, including hip: Secondary | ICD-10-CM | POA: Diagnosis not present

## 2020-10-29 DIAGNOSIS — L298 Other pruritus: Secondary | ICD-10-CM | POA: Diagnosis not present

## 2020-10-29 DIAGNOSIS — L538 Other specified erythematous conditions: Secondary | ICD-10-CM | POA: Diagnosis not present

## 2020-10-29 DIAGNOSIS — Z08 Encounter for follow-up examination after completed treatment for malignant neoplasm: Secondary | ICD-10-CM | POA: Diagnosis not present

## 2020-10-30 DIAGNOSIS — H2513 Age-related nuclear cataract, bilateral: Secondary | ICD-10-CM | POA: Diagnosis not present

## 2020-11-17 DIAGNOSIS — Z23 Encounter for immunization: Secondary | ICD-10-CM | POA: Diagnosis not present

## 2020-11-18 DIAGNOSIS — C44722 Squamous cell carcinoma of skin of right lower limb, including hip: Secondary | ICD-10-CM | POA: Diagnosis not present

## 2020-11-18 DIAGNOSIS — L538 Other specified erythematous conditions: Secondary | ICD-10-CM | POA: Diagnosis not present

## 2020-11-18 DIAGNOSIS — R208 Other disturbances of skin sensation: Secondary | ICD-10-CM | POA: Diagnosis not present

## 2020-11-18 DIAGNOSIS — L298 Other pruritus: Secondary | ICD-10-CM | POA: Diagnosis not present

## 2020-11-18 DIAGNOSIS — L820 Inflamed seborrheic keratosis: Secondary | ICD-10-CM | POA: Diagnosis not present

## 2020-11-18 DIAGNOSIS — I872 Venous insufficiency (chronic) (peripheral): Secondary | ICD-10-CM | POA: Diagnosis not present

## 2020-11-18 DIAGNOSIS — Z789 Other specified health status: Secondary | ICD-10-CM | POA: Diagnosis not present

## 2021-01-26 DIAGNOSIS — Z6828 Body mass index (BMI) 28.0-28.9, adult: Secondary | ICD-10-CM | POA: Diagnosis not present

## 2021-01-26 DIAGNOSIS — M353 Polymyalgia rheumatica: Secondary | ICD-10-CM | POA: Diagnosis not present

## 2021-01-26 DIAGNOSIS — M2550 Pain in unspecified joint: Secondary | ICD-10-CM | POA: Diagnosis not present

## 2021-01-26 DIAGNOSIS — E663 Overweight: Secondary | ICD-10-CM | POA: Diagnosis not present

## 2021-02-04 DIAGNOSIS — M353 Polymyalgia rheumatica: Secondary | ICD-10-CM | POA: Diagnosis not present

## 2021-02-04 DIAGNOSIS — M8580 Other specified disorders of bone density and structure, unspecified site: Secondary | ICD-10-CM | POA: Diagnosis not present

## 2021-02-04 DIAGNOSIS — N1831 Chronic kidney disease, stage 3a: Secondary | ICD-10-CM | POA: Diagnosis not present

## 2021-03-25 DIAGNOSIS — L84 Corns and callosities: Secondary | ICD-10-CM | POA: Diagnosis not present

## 2021-03-25 DIAGNOSIS — L814 Other melanin hyperpigmentation: Secondary | ICD-10-CM | POA: Diagnosis not present

## 2021-03-25 DIAGNOSIS — L300 Nummular dermatitis: Secondary | ICD-10-CM | POA: Diagnosis not present

## 2021-03-25 DIAGNOSIS — R208 Other disturbances of skin sensation: Secondary | ICD-10-CM | POA: Diagnosis not present

## 2021-03-25 DIAGNOSIS — L821 Other seborrheic keratosis: Secondary | ICD-10-CM | POA: Diagnosis not present

## 2021-03-25 DIAGNOSIS — L718 Other rosacea: Secondary | ICD-10-CM | POA: Diagnosis not present

## 2021-03-25 DIAGNOSIS — L218 Other seborrheic dermatitis: Secondary | ICD-10-CM | POA: Diagnosis not present

## 2021-03-25 DIAGNOSIS — L298 Other pruritus: Secondary | ICD-10-CM | POA: Diagnosis not present

## 2021-03-25 DIAGNOSIS — L820 Inflamed seborrheic keratosis: Secondary | ICD-10-CM | POA: Diagnosis not present

## 2021-03-25 DIAGNOSIS — L570 Actinic keratosis: Secondary | ICD-10-CM | POA: Diagnosis not present

## 2021-03-25 DIAGNOSIS — D225 Melanocytic nevi of trunk: Secondary | ICD-10-CM | POA: Diagnosis not present

## 2021-03-25 DIAGNOSIS — L538 Other specified erythematous conditions: Secondary | ICD-10-CM | POA: Diagnosis not present

## 2021-05-04 DIAGNOSIS — N1831 Chronic kidney disease, stage 3a: Secondary | ICD-10-CM | POA: Diagnosis not present

## 2021-05-04 DIAGNOSIS — R7301 Impaired fasting glucose: Secondary | ICD-10-CM | POA: Diagnosis not present

## 2021-05-04 DIAGNOSIS — E782 Mixed hyperlipidemia: Secondary | ICD-10-CM | POA: Diagnosis not present

## 2021-05-11 DIAGNOSIS — R2681 Unsteadiness on feet: Secondary | ICD-10-CM | POA: Diagnosis not present

## 2021-05-11 DIAGNOSIS — Z0000 Encounter for general adult medical examination without abnormal findings: Secondary | ICD-10-CM | POA: Diagnosis not present

## 2021-05-11 DIAGNOSIS — Z1339 Encounter for screening examination for other mental health and behavioral disorders: Secondary | ICD-10-CM | POA: Diagnosis not present

## 2021-05-11 DIAGNOSIS — R82998 Other abnormal findings in urine: Secondary | ICD-10-CM | POA: Diagnosis not present

## 2021-05-11 DIAGNOSIS — M353 Polymyalgia rheumatica: Secondary | ICD-10-CM | POA: Diagnosis not present

## 2021-05-11 DIAGNOSIS — N1831 Chronic kidney disease, stage 3a: Secondary | ICD-10-CM | POA: Diagnosis not present

## 2021-05-11 DIAGNOSIS — Z1331 Encounter for screening for depression: Secondary | ICD-10-CM | POA: Diagnosis not present

## 2021-05-11 DIAGNOSIS — D692 Other nonthrombocytopenic purpura: Secondary | ICD-10-CM | POA: Diagnosis not present

## 2021-05-11 DIAGNOSIS — Z905 Acquired absence of kidney: Secondary | ICD-10-CM | POA: Diagnosis not present

## 2021-05-11 DIAGNOSIS — Z23 Encounter for immunization: Secondary | ICD-10-CM | POA: Diagnosis not present

## 2021-05-11 DIAGNOSIS — R7301 Impaired fasting glucose: Secondary | ICD-10-CM | POA: Diagnosis not present

## 2021-05-11 DIAGNOSIS — M8580 Other specified disorders of bone density and structure, unspecified site: Secondary | ICD-10-CM | POA: Diagnosis not present

## 2021-05-11 DIAGNOSIS — E782 Mixed hyperlipidemia: Secondary | ICD-10-CM | POA: Diagnosis not present

## 2021-05-13 DIAGNOSIS — M65841 Other synovitis and tenosynovitis, right hand: Secondary | ICD-10-CM | POA: Diagnosis not present

## 2021-05-13 DIAGNOSIS — M79641 Pain in right hand: Secondary | ICD-10-CM | POA: Diagnosis not present

## 2021-06-17 DIAGNOSIS — M65331 Trigger finger, right middle finger: Secondary | ICD-10-CM | POA: Diagnosis not present

## 2021-09-28 DIAGNOSIS — L821 Other seborrheic keratosis: Secondary | ICD-10-CM | POA: Diagnosis not present

## 2021-09-28 DIAGNOSIS — Z08 Encounter for follow-up examination after completed treatment for malignant neoplasm: Secondary | ICD-10-CM | POA: Diagnosis not present

## 2021-09-28 DIAGNOSIS — L814 Other melanin hyperpigmentation: Secondary | ICD-10-CM | POA: Diagnosis not present

## 2021-09-28 DIAGNOSIS — L718 Other rosacea: Secondary | ICD-10-CM | POA: Diagnosis not present

## 2021-09-28 DIAGNOSIS — L570 Actinic keratosis: Secondary | ICD-10-CM | POA: Diagnosis not present

## 2021-09-28 DIAGNOSIS — Z85828 Personal history of other malignant neoplasm of skin: Secondary | ICD-10-CM | POA: Diagnosis not present

## 2021-09-28 DIAGNOSIS — D225 Melanocytic nevi of trunk: Secondary | ICD-10-CM | POA: Diagnosis not present

## 2021-09-28 DIAGNOSIS — L578 Other skin changes due to chronic exposure to nonionizing radiation: Secondary | ICD-10-CM | POA: Diagnosis not present

## 2021-09-28 DIAGNOSIS — L820 Inflamed seborrheic keratosis: Secondary | ICD-10-CM | POA: Diagnosis not present

## 2021-09-28 DIAGNOSIS — L538 Other specified erythematous conditions: Secondary | ICD-10-CM | POA: Diagnosis not present

## 2021-10-21 DIAGNOSIS — Z1231 Encounter for screening mammogram for malignant neoplasm of breast: Secondary | ICD-10-CM | POA: Diagnosis not present

## 2021-11-05 DIAGNOSIS — Z23 Encounter for immunization: Secondary | ICD-10-CM | POA: Diagnosis not present

## 2022-01-26 DIAGNOSIS — M7061 Trochanteric bursitis, right hip: Secondary | ICD-10-CM | POA: Diagnosis not present

## 2022-02-23 DIAGNOSIS — H25813 Combined forms of age-related cataract, bilateral: Secondary | ICD-10-CM | POA: Diagnosis not present

## 2022-02-23 DIAGNOSIS — H04123 Dry eye syndrome of bilateral lacrimal glands: Secondary | ICD-10-CM | POA: Diagnosis not present

## 2022-02-24 DIAGNOSIS — L309 Dermatitis, unspecified: Secondary | ICD-10-CM | POA: Diagnosis not present

## 2022-02-24 DIAGNOSIS — L718 Other rosacea: Secondary | ICD-10-CM | POA: Diagnosis not present

## 2022-02-24 DIAGNOSIS — L298 Other pruritus: Secondary | ICD-10-CM | POA: Diagnosis not present

## 2022-02-24 DIAGNOSIS — L821 Other seborrheic keratosis: Secondary | ICD-10-CM | POA: Diagnosis not present

## 2022-02-24 DIAGNOSIS — L218 Other seborrheic dermatitis: Secondary | ICD-10-CM | POA: Diagnosis not present

## 2022-04-02 DIAGNOSIS — L218 Other seborrheic dermatitis: Secondary | ICD-10-CM | POA: Diagnosis not present

## 2022-04-02 DIAGNOSIS — L718 Other rosacea: Secondary | ICD-10-CM | POA: Diagnosis not present

## 2022-04-02 DIAGNOSIS — L578 Other skin changes due to chronic exposure to nonionizing radiation: Secondary | ICD-10-CM | POA: Diagnosis not present

## 2022-04-14 DIAGNOSIS — M65331 Trigger finger, right middle finger: Secondary | ICD-10-CM | POA: Diagnosis not present

## 2022-04-26 DIAGNOSIS — H04123 Dry eye syndrome of bilateral lacrimal glands: Secondary | ICD-10-CM | POA: Diagnosis not present

## 2022-05-10 DIAGNOSIS — N1831 Chronic kidney disease, stage 3a: Secondary | ICD-10-CM | POA: Diagnosis not present

## 2022-05-10 DIAGNOSIS — E782 Mixed hyperlipidemia: Secondary | ICD-10-CM | POA: Diagnosis not present

## 2022-05-10 DIAGNOSIS — R7989 Other specified abnormal findings of blood chemistry: Secondary | ICD-10-CM | POA: Diagnosis not present

## 2022-05-10 DIAGNOSIS — R7301 Impaired fasting glucose: Secondary | ICD-10-CM | POA: Diagnosis not present

## 2022-05-18 DIAGNOSIS — H938X3 Other specified disorders of ear, bilateral: Secondary | ICD-10-CM | POA: Diagnosis not present

## 2022-05-18 DIAGNOSIS — J069 Acute upper respiratory infection, unspecified: Secondary | ICD-10-CM | POA: Diagnosis not present

## 2022-05-18 DIAGNOSIS — R051 Acute cough: Secondary | ICD-10-CM | POA: Diagnosis not present

## 2022-05-18 DIAGNOSIS — Z1152 Encounter for screening for COVID-19: Secondary | ICD-10-CM | POA: Diagnosis not present

## 2022-05-18 DIAGNOSIS — R0981 Nasal congestion: Secondary | ICD-10-CM | POA: Diagnosis not present

## 2022-05-18 DIAGNOSIS — J40 Bronchitis, not specified as acute or chronic: Secondary | ICD-10-CM | POA: Diagnosis not present

## 2022-05-21 DIAGNOSIS — R2681 Unsteadiness on feet: Secondary | ICD-10-CM | POA: Diagnosis not present

## 2022-05-21 DIAGNOSIS — Z1339 Encounter for screening examination for other mental health and behavioral disorders: Secondary | ICD-10-CM | POA: Diagnosis not present

## 2022-05-21 DIAGNOSIS — Z905 Acquired absence of kidney: Secondary | ICD-10-CM | POA: Diagnosis not present

## 2022-05-21 DIAGNOSIS — M353 Polymyalgia rheumatica: Secondary | ICD-10-CM | POA: Diagnosis not present

## 2022-05-21 DIAGNOSIS — Z0000 Encounter for general adult medical examination without abnormal findings: Secondary | ICD-10-CM | POA: Diagnosis not present

## 2022-05-21 DIAGNOSIS — Z1331 Encounter for screening for depression: Secondary | ICD-10-CM | POA: Diagnosis not present

## 2022-05-21 DIAGNOSIS — M8580 Other specified disorders of bone density and structure, unspecified site: Secondary | ICD-10-CM | POA: Diagnosis not present

## 2022-05-21 DIAGNOSIS — N1831 Chronic kidney disease, stage 3a: Secondary | ICD-10-CM | POA: Diagnosis not present

## 2022-05-21 DIAGNOSIS — R7301 Impaired fasting glucose: Secondary | ICD-10-CM | POA: Diagnosis not present

## 2022-05-21 DIAGNOSIS — R82998 Other abnormal findings in urine: Secondary | ICD-10-CM | POA: Diagnosis not present

## 2022-05-21 DIAGNOSIS — E782 Mixed hyperlipidemia: Secondary | ICD-10-CM | POA: Diagnosis not present

## 2022-05-21 DIAGNOSIS — D692 Other nonthrombocytopenic purpura: Secondary | ICD-10-CM | POA: Diagnosis not present

## 2022-05-21 DIAGNOSIS — J209 Acute bronchitis, unspecified: Secondary | ICD-10-CM | POA: Diagnosis not present

## 2022-10-06 DIAGNOSIS — Z08 Encounter for follow-up examination after completed treatment for malignant neoplasm: Secondary | ICD-10-CM | POA: Diagnosis not present

## 2022-10-06 DIAGNOSIS — D485 Neoplasm of uncertain behavior of skin: Secondary | ICD-10-CM | POA: Diagnosis not present

## 2022-10-06 DIAGNOSIS — D225 Melanocytic nevi of trunk: Secondary | ICD-10-CM | POA: Diagnosis not present

## 2022-10-06 DIAGNOSIS — C44629 Squamous cell carcinoma of skin of left upper limb, including shoulder: Secondary | ICD-10-CM | POA: Diagnosis not present

## 2022-10-06 DIAGNOSIS — B353 Tinea pedis: Secondary | ICD-10-CM | POA: Diagnosis not present

## 2022-10-06 DIAGNOSIS — L570 Actinic keratosis: Secondary | ICD-10-CM | POA: Diagnosis not present

## 2022-10-06 DIAGNOSIS — Z85828 Personal history of other malignant neoplasm of skin: Secondary | ICD-10-CM | POA: Diagnosis not present

## 2022-10-06 DIAGNOSIS — L814 Other melanin hyperpigmentation: Secondary | ICD-10-CM | POA: Diagnosis not present

## 2022-10-06 DIAGNOSIS — L821 Other seborrheic keratosis: Secondary | ICD-10-CM | POA: Diagnosis not present

## 2022-11-08 DIAGNOSIS — C44622 Squamous cell carcinoma of skin of right upper limb, including shoulder: Secondary | ICD-10-CM | POA: Diagnosis not present

## 2022-11-08 DIAGNOSIS — L821 Other seborrheic keratosis: Secondary | ICD-10-CM | POA: Diagnosis not present

## 2022-11-08 DIAGNOSIS — L814 Other melanin hyperpigmentation: Secondary | ICD-10-CM | POA: Diagnosis not present

## 2022-11-25 DIAGNOSIS — Z1231 Encounter for screening mammogram for malignant neoplasm of breast: Secondary | ICD-10-CM | POA: Diagnosis not present

## 2023-01-31 IMAGING — CR DG CHEST 2V
2 series · 2 of 2 positions shown · non-contrast
Comparison: 01/23/2018

CLINICAL DATA: Right kidney mass

EXAM:
CHEST - 2 VIEW

[w chest pa]
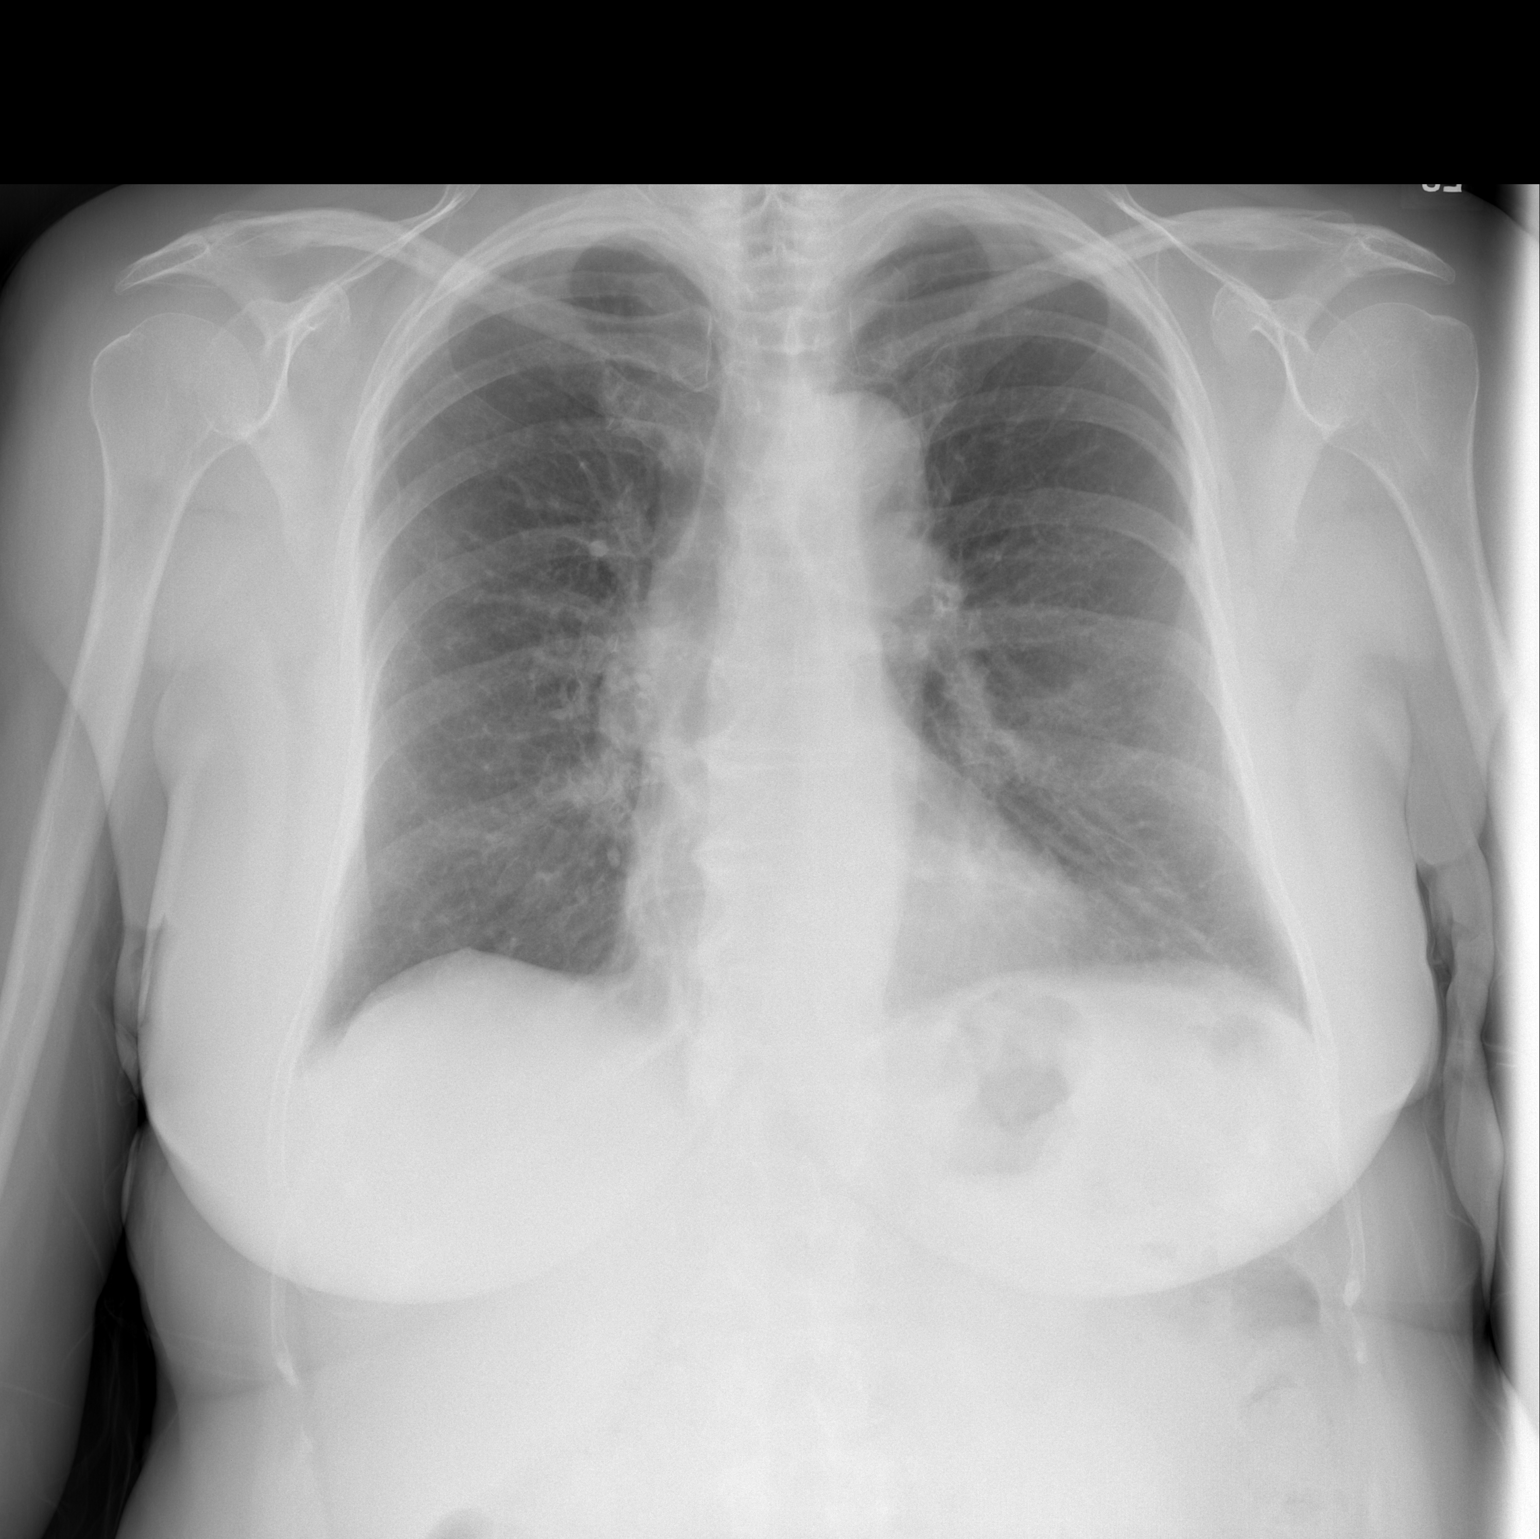

[w chest lat]
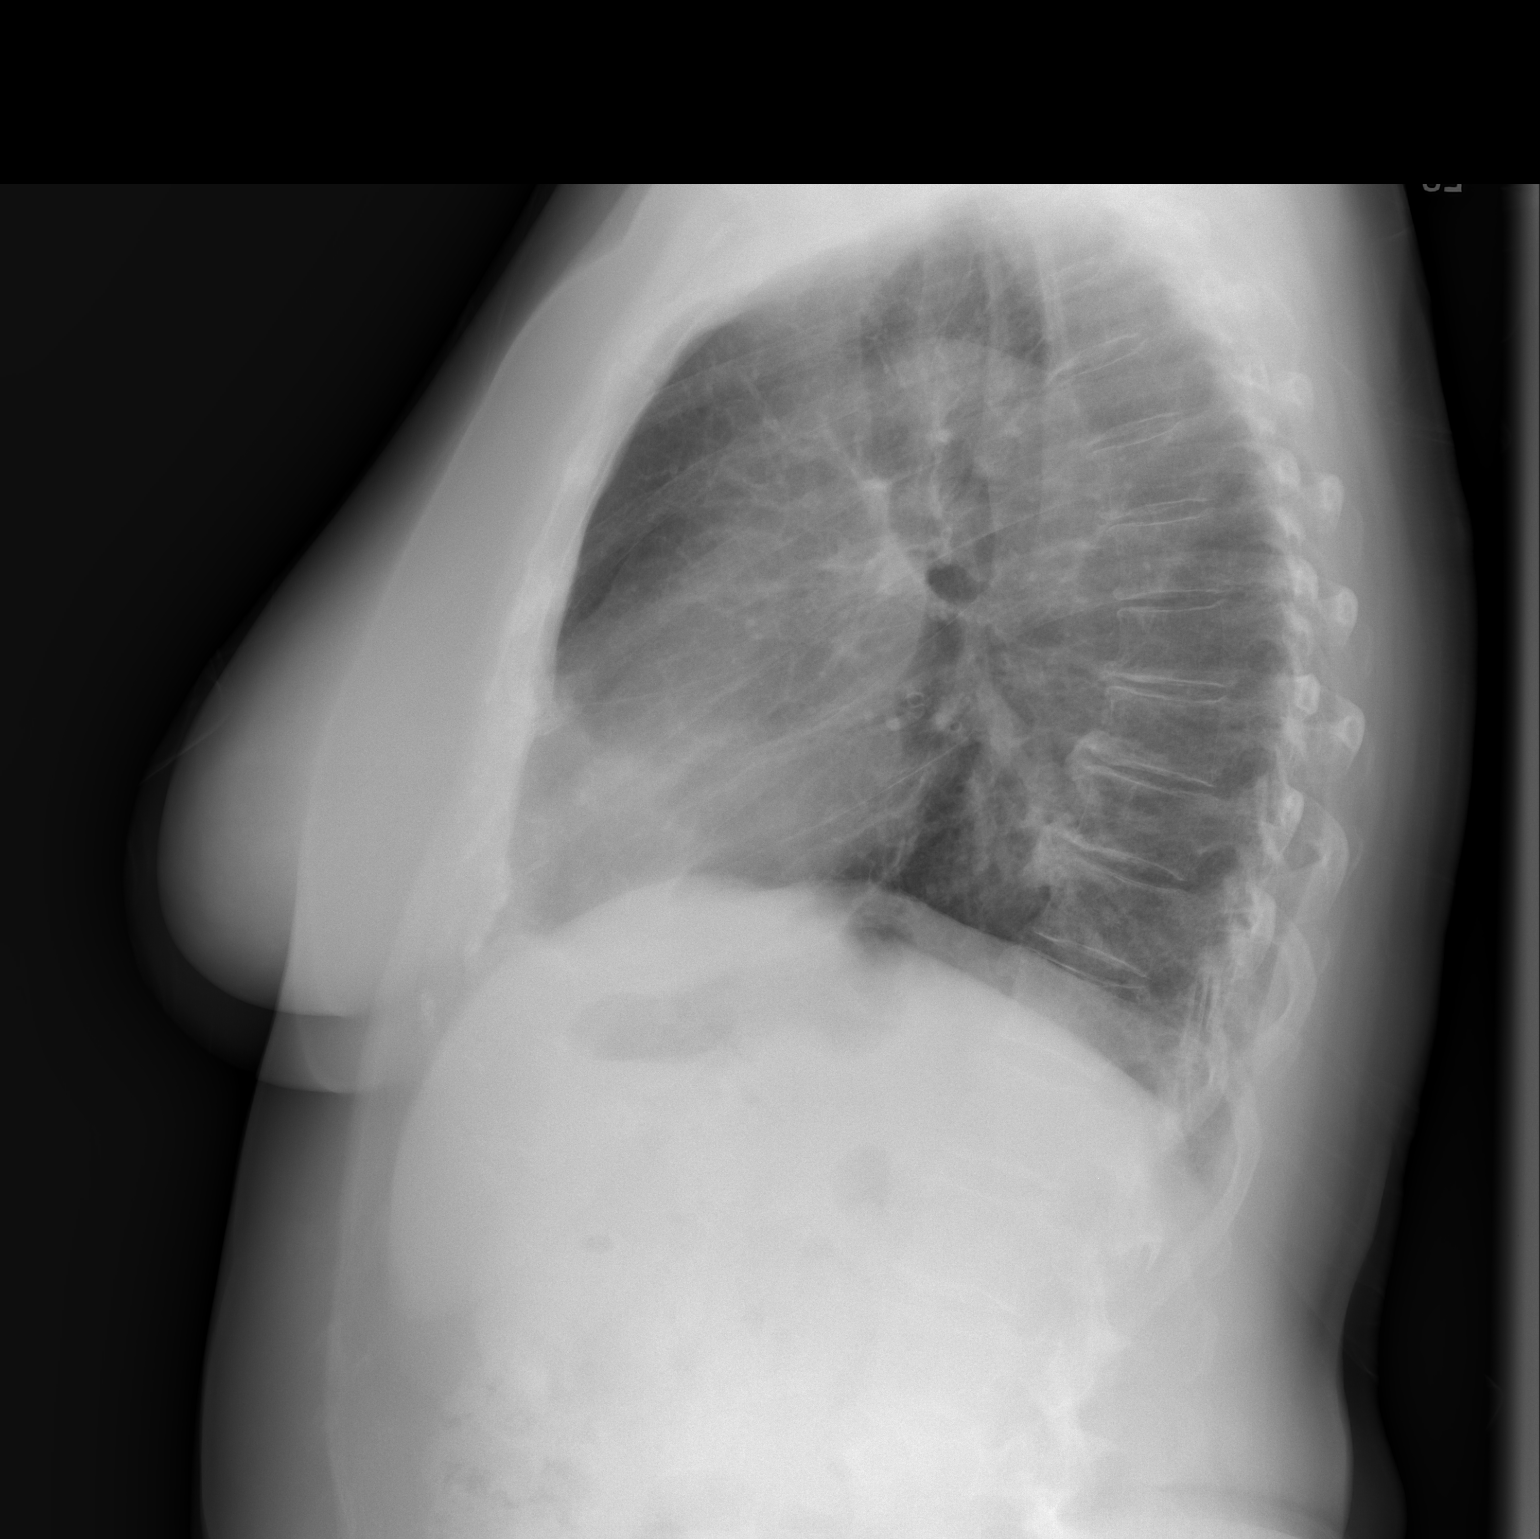

[2 of 2 positions shown; findings below may reference images not displayed]

FINDINGS: Heart and mediastinal contours are within normal limits. No focal
opacities or effusions. No acute bony abnormality.
IMPRESSION: No active cardiopulmonary disease.

## 2023-02-17 DIAGNOSIS — Z08 Encounter for follow-up examination after completed treatment for malignant neoplasm: Secondary | ICD-10-CM | POA: Diagnosis not present

## 2023-02-17 DIAGNOSIS — L821 Other seborrheic keratosis: Secondary | ICD-10-CM | POA: Diagnosis not present

## 2023-02-17 DIAGNOSIS — L814 Other melanin hyperpigmentation: Secondary | ICD-10-CM | POA: Diagnosis not present

## 2023-02-17 DIAGNOSIS — L718 Other rosacea: Secondary | ICD-10-CM | POA: Diagnosis not present

## 2023-02-17 DIAGNOSIS — L820 Inflamed seborrheic keratosis: Secondary | ICD-10-CM | POA: Diagnosis not present

## 2023-02-17 DIAGNOSIS — Z85828 Personal history of other malignant neoplasm of skin: Secondary | ICD-10-CM | POA: Diagnosis not present

## 2023-02-17 DIAGNOSIS — D225 Melanocytic nevi of trunk: Secondary | ICD-10-CM | POA: Diagnosis not present

## 2023-02-17 DIAGNOSIS — L570 Actinic keratosis: Secondary | ICD-10-CM | POA: Diagnosis not present

## 2023-02-17 DIAGNOSIS — R208 Other disturbances of skin sensation: Secondary | ICD-10-CM | POA: Diagnosis not present

## 2023-03-22 DIAGNOSIS — R5383 Other fatigue: Secondary | ICD-10-CM | POA: Diagnosis not present

## 2023-03-22 DIAGNOSIS — Z1152 Encounter for screening for COVID-19: Secondary | ICD-10-CM | POA: Diagnosis not present

## 2023-03-22 DIAGNOSIS — R058 Other specified cough: Secondary | ICD-10-CM | POA: Diagnosis not present

## 2023-03-22 DIAGNOSIS — J101 Influenza due to other identified influenza virus with other respiratory manifestations: Secondary | ICD-10-CM | POA: Diagnosis not present

## 2023-03-22 DIAGNOSIS — G43909 Migraine, unspecified, not intractable, without status migrainosus: Secondary | ICD-10-CM | POA: Diagnosis not present

## 2023-03-22 DIAGNOSIS — R0981 Nasal congestion: Secondary | ICD-10-CM | POA: Diagnosis not present

## 2023-05-30 DIAGNOSIS — R7989 Other specified abnormal findings of blood chemistry: Secondary | ICD-10-CM | POA: Diagnosis not present

## 2023-05-30 DIAGNOSIS — E782 Mixed hyperlipidemia: Secondary | ICD-10-CM | POA: Diagnosis not present

## 2023-05-30 DIAGNOSIS — N1831 Chronic kidney disease, stage 3a: Secondary | ICD-10-CM | POA: Diagnosis not present

## 2023-05-30 DIAGNOSIS — R7301 Impaired fasting glucose: Secondary | ICD-10-CM | POA: Diagnosis not present

## 2023-05-30 DIAGNOSIS — M8580 Other specified disorders of bone density and structure, unspecified site: Secondary | ICD-10-CM | POA: Diagnosis not present

## 2023-05-30 DIAGNOSIS — E785 Hyperlipidemia, unspecified: Secondary | ICD-10-CM | POA: Diagnosis not present

## 2023-06-06 DIAGNOSIS — R2681 Unsteadiness on feet: Secondary | ICD-10-CM | POA: Diagnosis not present

## 2023-06-06 DIAGNOSIS — Z1331 Encounter for screening for depression: Secondary | ICD-10-CM | POA: Diagnosis not present

## 2023-06-06 DIAGNOSIS — Z0000 Encounter for general adult medical examination without abnormal findings: Secondary | ICD-10-CM | POA: Diagnosis not present

## 2023-06-06 DIAGNOSIS — Z23 Encounter for immunization: Secondary | ICD-10-CM | POA: Diagnosis not present

## 2023-06-06 DIAGNOSIS — R82998 Other abnormal findings in urine: Secondary | ICD-10-CM | POA: Diagnosis not present

## 2023-06-06 DIAGNOSIS — Z905 Acquired absence of kidney: Secondary | ICD-10-CM | POA: Diagnosis not present

## 2023-06-06 DIAGNOSIS — R7301 Impaired fasting glucose: Secondary | ICD-10-CM | POA: Diagnosis not present

## 2023-06-06 DIAGNOSIS — R0789 Other chest pain: Secondary | ICD-10-CM | POA: Diagnosis not present

## 2023-06-06 DIAGNOSIS — N1831 Chronic kidney disease, stage 3a: Secondary | ICD-10-CM | POA: Diagnosis not present

## 2023-06-06 DIAGNOSIS — H9193 Unspecified hearing loss, bilateral: Secondary | ICD-10-CM | POA: Diagnosis not present

## 2023-06-06 DIAGNOSIS — Z8739 Personal history of other diseases of the musculoskeletal system and connective tissue: Secondary | ICD-10-CM | POA: Diagnosis not present

## 2023-06-06 DIAGNOSIS — D692 Other nonthrombocytopenic purpura: Secondary | ICD-10-CM | POA: Diagnosis not present

## 2023-06-06 DIAGNOSIS — E782 Mixed hyperlipidemia: Secondary | ICD-10-CM | POA: Diagnosis not present

## 2023-06-06 DIAGNOSIS — M8580 Other specified disorders of bone density and structure, unspecified site: Secondary | ICD-10-CM | POA: Diagnosis not present

## 2023-06-06 DIAGNOSIS — Z1339 Encounter for screening examination for other mental health and behavioral disorders: Secondary | ICD-10-CM | POA: Diagnosis not present

## 2023-06-20 DIAGNOSIS — M8588 Other specified disorders of bone density and structure, other site: Secondary | ICD-10-CM | POA: Diagnosis not present

## 2023-06-20 DIAGNOSIS — Z8262 Family history of osteoporosis: Secondary | ICD-10-CM | POA: Diagnosis not present

## 2023-06-21 NOTE — Therapy (Signed)
 OUTPATIENT PHYSICAL THERAPY LOWER EXTREMITY EVALUATION   Patient Name: Summer Collins MRN: [id number] DOB:February 24, 1940, 83 y.o., female Today's Date: 06/22/2023  END OF SESSION:  PT End of Session - 06/22/23 1156     Visit Number 1    Date for PT Re-Evaluation 08/17/23    Authorization Type Medicare B, T[employer]are (KX at 15)    Progress Note Due on Visit 10    PT Start Time 1104    PT Stop Time 1148    PT Time Calculation (min) 44 min    Activity Tolerance Patient tolerated treatment well    Behavior During Therapy WFL for tasks assessed/performed             Past Medical History:  Diagnosis Date   Anxiety     on low dose zoloft  for a while   Arthritis    Cancer (HCC)    kidneys   Depression    Hyperlipidemia    Osteopenia    Vitamin D  deficiency    Past Surgical History:  Procedure Laterality Date   CARPAL TUNNEL RELEASE     DILATION AND CURETTAGE OF UTERUS     x 3   ROBOT ASSISTED LAPAROSCOPIC NEPHRECTOMY Right 07/20/2019   Procedure: XI ROBOTIC ASSISTED LAPAROSCOPIC RADICAL NEPHRECTOMY;  Surgeon: Osborn Blaze, MD;  Location: [facility];  Service: Urology;  Laterality: Right;  3 HRS   Patient Active Problem List   Diagnosis Date Noted   Right renal mass 07/20/2019   Nasal congestion 02/04/2014   Estrogen deficiency 02/04/2014   Medication management 02/04/2014   Medicare annual wellness visit, subsequent 02/04/2014   Postural dizziness 11/30/2013   Knee swelling 05/09/2012   Adverse drug effect 05/09/2012   Ocular rosacea 12/15/2011   Hx of nonmelanoma skin cancer 12/15/2011   Nonspecific abnormal results of liver function study 10/31/2010   Screening for ischemic heart disease 10/31/2010   Preventative health care 10/31/2010   Hormone replacement therapy (postmenopausal) 08/22/2010   Hyperlipidemia    Osteopenia    Anxiety    Anxiety     PCP: Pecolia Bourbon, MD  REFERRING PROVIDER: Pecolia Bourbon, MD  REFERRING DIAG:  Diagnosis  R26.81 (ICD-10-CM) -  Unsteadiness on feet    THERAPY DIAG:  Other abnormalities of gait and mobility - Plan: PT plan of care cert/re-cert  Muscle weakness (generalized) - Plan: PT plan of care cert/re-cert  Rationale for Evaluation and Treatment: Rehabilitation  ONSET DATE: chronic balance deficits-has been "furniture walking"  SUBJECTIVE:   SUBJECTIVE STATEMENT: Pt presents to PT with balance deficits of a chronic nature.  She has been "furniture walking" for years and is very careful due to fear of falling  PERTINENT HISTORY: Osteopenia, anxiety, kidney-Rt removed (07/22/19) PAIN: 06/22/23 Are you having pain? Yes: NPRS scale: 0-6/10 Pain location: bil hips Pain description: sore/aching  Aggravating factors: standing and walking, sidelying in bed  Relieving factors: movement   PRECAUTIONS: Fall  RED FLAGS: None   WEIGHT BEARING RESTRICTIONS: No  FALLS:  Has patient fallen in last 6 months? No  LIVING ENVIRONMENT: Lives with: lives alone Lives in: House/apartment Stairs: No Has following equipment at home: None  OCCUPATION: retired   PLOF: Independent and Leisure: " I need to walk"  PATIENT GOALS: reduce falls risk, start walking   NEXT MD VISIT: none   OBJECTIVE:  Note: Objective measures were completed at Evaluation unless otherwise noted.  DIAGNOSTIC FINDINGS: bone scan for osteoporosis    COGNITION: Overall cognitive status: Within functional limits for  tasks assessed     SENSATION: WFL    POSTURE: rounded shoulders and forward head  PALPATION: NA    LOWER EXTREMITY MMT:  Bil hips 4/5, knees 4+/5, ankles 4/5   FUNCTIONAL TESTS:  5 times sit to stand: 15.15 seconds  Timed up and go (TUG): 9.53 seconds  6 minute walk test: 1008 ft with RPE 6/10- age related norm 1164 feet   GAIT: Distance walked: 100 feet  Assistive device utilized: None Level of assistance: Complete Independence Comments: mild staggering with fatigue, shortened step length                                                                                                                                  TREATMENT DATE:  06/22/23 Findings from evaluation discussed, pt educated on plan of care, HEP initiated.      PATIENT EDUCATION:  Education details: Access Code: [email or web address] Person educated: Patient Education method: Explanation, Demonstration, and Handouts Education comprehension: verbalized understanding and returned demonstration  HOME EXERCISE PROGRAM: Access Code: [email or web address] URL: [email or web address] Date: 06/22/2023 Prepared by: Loetta Ringer  Exercises - Seated Long Arc Quad  - 3 x daily - 7 x weekly - 2 sets - 10 reps - 5 hold - Seated March   - 3 x daily - 7 x weekly - 3 sets - 10 reps - Seated Heel Toe Raises   - 3 x daily - 7 x weekly - 2 sets - 10 reps - Seated Hamstring Stretch  - 3 x daily - 7 x weekly - 1 sets - 3 reps - 20 hold - Seated Figure 4 Piriformis Stretch  - 1 x daily - 7 x weekly - 3 sets - 10 reps - Sit to Stand Without Arm Support  - 2 x daily - 7 x weekly - 1-2 sets - 10 reps  ASSESSMENT:  CLINICAL IMPRESSION: Patient is a 83 y.o. female who was seen today for physical therapy evaluation and treatment for gait and balance deficits. She reports that she has been off balance for many years but has not had a fall.  She reports reduced endurance and some bil hip pain as well.  She is at a falls risk based on 5x sit to stand time and her 6 min walk time is less than her age related norm.  She will benefit from skilled PT to address strength, balance and flexibility to improve safety and function.   OBJECTIVE IMPAIRMENTS: decreased activity tolerance, decreased endurance, difficulty walking, decreased strength, postural dysfunction, and pain.   ACTIVITY LIMITATIONS: standing, stairs, transfers, and locomotion level  PARTICIPATION LIMITATIONS: meal prep, cleaning, laundry, shopping, and community activity  PERSONAL FACTORS: Age, Time since  onset of injury/illness/exacerbation, and 1 comorbidity: osteopenia are also affecting patient's functional outcome.   REHAB POTENTIAL: Good  CLINICAL DECISION MAKING: Stable/uncomplicated  EVALUATION COMPLEXITY: Low   GOALS: Goals reviewed with patient? Yes  SHORT TERM GOALS: Target date: 07/20/2023  Be independent in initial HEP Baseline: Goal status: INITIAL  2.  Report > or = to 50% less use of Ues with sit to stand transition due to improved leg strength  Baseline:  Goal status: INITIAL  3.  Improve endurance to report < or 4/10 RPE with 6 min walk test  Baseline:  Goal status: INITIAL      LONG TERM GOALS: Target date: 08/17/2023    Be independent in advanced HEP Baseline:  Goal status: INITIAL  2.  Improve 6 min walk test to 1150 feet to improve community endurance  Baseline: 1008 feet  Goal status: INITIAL  3.  Report < or = 3/10 RPE with 6 min walk test due to improved endurance  Baseline:  Goal status: INITIAL  4.  Perform 5x sit to stand in < or = to 12 seconds to reduce falls risk Baseline:  Goal status: INITIAL  5.  Initiate a walking program and verbalize safe progression to improve community endurance  Baseline:  Goal status: INITIAL  6.  Report > or = to 50% improved confidence with balance in the community due to improved strength and stability  Baseline:  Goal status: INITIAL   PLAN:  PT FREQUENCY: 2x/week  PT DURATION: 8 weeks  PLANNED INTERVENTIONS: 97110-Therapeutic exercises, 97530- Therapeutic activity, 97112- Neuromuscular re-education, 97535- Self Care, [id number]- Manual therapy, [phone]- Gait training, [phone]- Canalith repositioning, [id number]- Aquatic Therapy, [phone] (1-2 muscles), [id number] (3+ muscles)- Dry Needling, Patient/Family education, Balance training, Stair training, Taping, Joint mobilization, Vestibular training, Cryotherapy, and Moist heat  PLAN FOR NEXT SESSION: work on balance, hip and low back flexibility, postural strength,  NuStep   [employer], PT 06/22/23 1:27 PM

## 2023-06-22 ENCOUNTER — Ambulatory Visit: Attending: Internal Medicine

## 2023-06-22 ENCOUNTER — Other Ambulatory Visit: Payer: Self-pay

## 2023-06-22 DIAGNOSIS — M6281 Muscle weakness (generalized): Secondary | ICD-10-CM | POA: Insufficient documentation

## 2023-06-22 DIAGNOSIS — R2689 Other abnormalities of gait and mobility: Secondary | ICD-10-CM | POA: Insufficient documentation

## 2023-07-13 ENCOUNTER — Ambulatory Visit

## 2023-07-13 DIAGNOSIS — M6281 Muscle weakness (generalized): Secondary | ICD-10-CM | POA: Diagnosis not present

## 2023-07-13 DIAGNOSIS — R2689 Other abnormalities of gait and mobility: Secondary | ICD-10-CM

## 2023-07-13 NOTE — Therapy (Signed)
 OUTPATIENT PHYSICAL THERAPY TREATMENT   Patient Name: Summer Collins MRN: [id number] DOB:1940-10-25, 83 y.o., female Today's Date: 07/13/2023  END OF SESSION:  PT End of Session - 07/13/23 1309     Visit Number 2    Date for PT Re-Evaluation 08/17/23    Authorization Type Medicare B, Tricare (KX at 15)    Progress Note Due on Visit 10    PT Start Time 1224    PT Stop Time 1310    PT Time Calculation (min) 46 min    Activity Tolerance Patient tolerated treatment well    Behavior During Therapy WFL for tasks assessed/performed           Past Medical History:  Diagnosis Date   Anxiety     on low dose zoloft  for a while   Arthritis    Cancer (HCC)    kidneys   Depression    Hyperlipidemia    Osteopenia    Vitamin D  deficiency    Past Surgical History:  Procedure Laterality Date   CARPAL TUNNEL RELEASE     DILATION AND CURETTAGE OF UTERUS     x 3   ROBOT ASSISTED LAPAROSCOPIC NEPHRECTOMY Right 07/20/2019   Procedure: XI ROBOTIC ASSISTED LAPAROSCOPIC RADICAL NEPHRECTOMY;  Surgeon: Alvaro Hummer, MD;  Location: [facility];  Service: Urology;  Laterality: Right;  3 HRS   Patient Active Problem List   Diagnosis Date Noted   Right renal mass 07/20/2019   Nasal congestion 02/04/2014   Estrogen deficiency 02/04/2014   Medication management 02/04/2014   Medicare annual wellness visit, subsequent 02/04/2014   Postural dizziness 11/30/2013   Knee swelling 05/09/2012   Adverse drug effect 05/09/2012   Ocular rosacea 12/15/2011   Hx of nonmelanoma skin cancer 12/15/2011   Nonspecific abnormal results of liver function study 10/31/2010   Screening for ischemic heart disease 10/31/2010   Preventative health care 10/31/2010   Hormone replacement therapy (postmenopausal) 08/22/2010   Hyperlipidemia    Osteopenia    Anxiety    Anxiety     PCP: Loreli Fallow, MD  REFERRING PROVIDER: Loreli Fallow, MD  REFERRING DIAG:  Diagnosis  R26.81 (ICD-10-CM) - Unsteadiness on feet     THERAPY DIAG:  Muscle weakness (generalized)  Other abnormalities of gait and mobility  Rationale for Evaluation and Treatment: Rehabilitation  ONSET DATE: chronic balance deficits-has been furniture walking  SUBJECTIVE:   SUBJECTIVE STATEMENT: I haven't been doing my exercises as much as I should.    PERTINENT HISTORY: Osteopenia, anxiety, kidney-Rt removed (07/22/19) PAIN: 07/13/23 Are you having pain? Yes: NPRS scale: 0/10 Pain location: bil hips Pain description: sore/aching  Aggravating factors: standing and walking, sidelying in bed  Relieving factors: movement   PRECAUTIONS: Fall  RED FLAGS: None   WEIGHT BEARING RESTRICTIONS: No  FALLS:  Has patient fallen in last 6 months? No  LIVING ENVIRONMENT: Lives with: lives alone Lives in: House/apartment Stairs: No Has following equipment at home: None  OCCUPATION: retired   PLOF: Independent and Leisure:  I need to walk  PATIENT GOALS: reduce falls risk, start walking   NEXT MD VISIT: none   OBJECTIVE:  Note: Objective measures were completed at Evaluation unless otherwise noted.  DIAGNOSTIC FINDINGS: bone scan for osteoporosis    COGNITION: Overall cognitive status: Within functional limits for tasks assessed     SENSATION: WFL    POSTURE: rounded shoulders and forward head  PALPATION: NA    LOWER EXTREMITY MMT:  Bil hips 4/5, knees 4+/5, ankles  4/5   FUNCTIONAL TESTS:  5 times sit to stand: 15.15 seconds  Timed up and go (TUG): 9.53 seconds  6 minute walk test: 1008 ft with RPE 6/10- age related norm 1164 feet   GAIT: Distance walked: 100 feet  Assistive device utilized: None Level of assistance: Complete Independence Comments: mild staggering with fatigue, shortened step length                                                                                                                                 TREATMENT DATE:   06/22/23 NuStep Level 5x 6 min- PT present to  discuss progress Long arc quads 2# added 2x10 Seated marching 2# added 2x10 Figure 4 and hamstring stretch seated 2x20 seconds  Seated heel/toe raises x15 Standing hip abduction 2x10 bil each, extensions   Sit to stand x8  Stated ball squeeze: 5 hold x10   06/22/23 Findings from evaluation discussed, pt educated on plan of care, HEP initiated.      PATIENT EDUCATION:  Education details: Access Code: [email or web address] Person educated: Patient Education method: Explanation, Demonstration, and Handouts Education comprehension: verbalized understanding and returned demonstration  HOME EXERCISE PROGRAM: Access Code: [email or web address] URL: [email or web address] Date: 06/22/2023 Prepared by: Burnard  Exercises - Seated Long Arc Quad  - 3 x daily - 7 x weekly - 2 sets - 10 reps - 5 hold - Seated March   - 3 x daily - 7 x weekly - 3 sets - 10 reps - Seated Heel Toe Raises   - 3 x daily - 7 x weekly - 2 sets - 10 reps - Seated Hamstring Stretch  - 3 x daily - 7 x weekly - 1 sets - 3 reps - 20 hold - Seated Figure 4 Piriformis Stretch  - 1 x daily - 7 x weekly - 3 sets - 10 reps - Sit to Stand Without Arm Support  - 2 x daily - 7 x weekly - 1-2 sets - 10 reps  ASSESSMENT:  CLINICAL IMPRESSION: First time follow-up after evaluation.  Pt reports that she has been doing her exercises intermittently. Pt did well with review of HEP and with advancement of exercises today. PT monitored for technique and fatigue.  PT educated pt on the purpose of each exercise and how it impacts gait and balance.  She will benefit from skilled PT to address strength, balance and flexibility to improve safety and function.   OBJECTIVE IMPAIRMENTS: decreased activity tolerance, decreased endurance, difficulty walking, decreased strength, postural dysfunction, and pain.   ACTIVITY LIMITATIONS: standing, stairs, transfers, and locomotion level  PARTICIPATION LIMITATIONS: meal prep, cleaning, laundry, shopping, and  community activity  PERSONAL FACTORS: Age, Time since onset of injury/illness/exacerbation, and 1 comorbidity: osteopenia are also affecting patient's functional outcome.   REHAB POTENTIAL: Good  CLINICAL DECISION MAKING: Stable/uncomplicated  EVALUATION COMPLEXITY: Low   GOALS: Goals reviewed with patient? Yes  SHORT TERM GOALS: Target date: 07/20/2023  Be independent in initial HEP Baseline: Goal status: in progress   2.  Report > or = to 50% less use of Ues with sit to stand transition due to improved leg strength  Baseline:  Goal status: INITIAL  3.  Improve endurance to report < or 4/10 RPE with 6 min walk test  Baseline:  Goal status: INITIAL      LONG TERM GOALS: Target date: 08/17/2023    Be independent in advanced HEP Baseline:  Goal status: INITIAL  2.  Improve 6 min walk test to 1150 feet to improve community endurance  Baseline: 1008 feet  Goal status: INITIAL  3.  Report < or = 3/10 RPE with 6 min walk test due to improved endurance  Baseline:  Goal status: INITIAL  4.  Perform 5x sit to stand in < or = to 12 seconds to reduce falls risk Baseline:  Goal status: INITIAL  5.  Initiate a walking program and verbalize safe progression to improve community endurance  Baseline:  Goal status: INITIAL  6.  Report > or = to 50% improved confidence with balance in the community due to improved strength and stability  Baseline:  Goal status: INITIAL   PLAN:  PT FREQUENCY: 2x/week  PT DURATION: 8 weeks  PLANNED INTERVENTIONS: 97110-Therapeutic exercises, 97530- Therapeutic activity, 97112- Neuromuscular re-education, 97535- Self Care, [id number]- Manual therapy, [phone]- Gait training, [phone]- Canalith repositioning, [id number]- Aquatic Therapy, [phone] (1-2 muscles), [id number] (3+ muscles)- Dry Needling, Patient/Family education, Balance training, Stair training, Taping, Joint mobilization, Vestibular training, Cryotherapy, and Moist heat  PLAN FOR NEXT SESSION: work  on balance, hip and low back flexibility, postural strength, NuStep   Burnard Joy, PT 07/13/23 1:17 PM

## 2023-07-18 ENCOUNTER — Ambulatory Visit

## 2023-07-18 DIAGNOSIS — M6281 Muscle weakness (generalized): Secondary | ICD-10-CM

## 2023-07-18 DIAGNOSIS — R2689 Other abnormalities of gait and mobility: Secondary | ICD-10-CM

## 2023-07-18 NOTE — Therapy (Signed)
 OUTPATIENT PHYSICAL THERAPY TREATMENT   Patient Name: Summer Collins MRN: [id number] DOB:[birth year], 83 y.o., female Today's Date: 07/18/2023  END OF SESSION:  PT End of Session - 07/18/23 1531     Visit Number 3    Date for PT Re-Evaluation 08/17/23    Authorization Type Medicare B, T[employer]are (KX at 15)    Progress Note Due on Visit 10    PT Start Time 1443    PT Stop Time 1521    PT Time Calculation (min) 38 min    Activity Tolerance Patient tolerated treatment well    Behavior During Therapy WFL for tasks assessed/performed            Past Medical History:  Diagnosis Date   Anxiety     on low dose zoloft  for a while   Arthritis    Cancer (HCC)    kidneys   Depression    Hyperlipidemia    Osteopenia    Vitamin D  deficiency    Past Surgical History:  Procedure Laterality Date   CARPAL TUNNEL RELEASE     DILATION AND CURETTAGE OF UTERUS     x 3   ROBOT ASSISTED LAPAROSCOPIC NEPHRECTOMY Right 07/20/2019   Procedure: XI ROBOTIC ASSISTED LAPAROSCOPIC RADICAL NEPHRECTOMY;  Surgeon: Alvaro Hummer, MD;  Location: [facility];  Service: Urology;  Laterality: Right;  3 HRS   Patient Active Problem List   Diagnosis Date Noted   Right renal mass 07/20/2019   Nasal congestion 02/04/2014   Estrogen deficiency 02/04/2014   Medication management 02/04/2014   Medicare annual wellness visit, subsequent 02/04/2014   Postural dizziness 11/30/2013   Knee swelling 05/09/2012   Adverse drug effect 05/09/2012   Ocular rosacea 12/15/2011   Hx of nonmelanoma skin cancer 12/15/2011   Nonspecific abnormal results of liver function study 10/31/2010   Screening for ischemic heart disease 10/31/2010   Preventative health care 10/31/2010   Hormone replacement therapy (postmenopausal) 08/22/2010   Hyperlipidemia    Osteopenia    Anxiety    Anxiety     PCP: Loreli Fallow, MD  REFERRING PROVIDER: Loreli Fallow, MD  REFERRING DIAG:  Diagnosis  R26.81 (ICD-10-CM) - Unsteadiness on  feet    THERAPY DIAG:  Muscle weakness (generalized)  Other abnormalities of gait and mobility  Rationale for Evaluation and Treatment: Rehabilitation  ONSET DATE: chronic balance deficits-has been furniture walking  SUBJECTIVE:   SUBJECTIVE STATEMENT: I haven't been doing my exercises as much as I should.    PERTINENT HISTORY: Osteopenia, anxiety, kidney-Rt removed (07/22/19) PAIN: 07/13/23 Are you having pain? Yes: NPRS scale: 0/10 Pain location: bil hips Pain description: sore/aching  Aggravating factors: standing and walking, sidelying in bed  Relieving factors: movement   PRECAUTIONS: Fall  RED FLAGS: None   WEIGHT BEARING RESTRICTIONS: No  FALLS:  Has patient fallen in last 6 months? No  LIVING ENVIRONMENT: Lives with: lives alone Lives in: House/apartment Stairs: No Has following equipment at home: None  OCCUPATION: retired   PLOF: Independent and Leisure:  I need to walk  PATIENT GOALS: reduce falls risk, start walking   NEXT MD VISIT: none   OBJECTIVE:  Note: Objective measures were completed at Evaluation unless otherwise noted.  DIAGNOSTIC FINDINGS: bone scan for osteoporosis    COGNITION: Overall cognitive status: Within functional limits for tasks assessed     SENSATION: WFL    POSTURE: rounded shoulders and forward head  PALPATION: NA    LOWER EXTREMITY MMT:  Bil hips 4/5, knees 4+/5,  ankles 4/5   FUNCTIONAL TESTS:  5 times sit to stand: 15.15 seconds  Timed up and go (TUG): 9.53 seconds  6 minute walk test: 1008 ft with RPE 6/10- age related norm 1164 feet   GAIT: Distance walked: 100 feet  Assistive device utilized: None Level of assistance: Complete Independence Comments: mild staggering with fatigue, shortened step length                                                                                                                                 TREATMENT DATE:   07/18/23 NuStep Level 5x 6 min- PT present  to discuss progress Long arc quads 2# added 2x10 ER with 2# weight 2x10 Figure 4 and hamstring stretch seated 2x20 seconds  Standing heel raises 2x10 Standing hip abduction 2x10 bil each, extensions   Sit to stand 2x10 Stated ball squeeze: 5 hold x10   07/13/23 NuStep Level 5x 6 min- PT present to discuss progress Long arc quads 2# added 2x10 Seated marching 2# added 2x10 Figure 4 and hamstring stretch seated 2x20 seconds  Seated heel/toe raises x15 Standing hip abduction 2x10 bil each, extensions   Sit to stand x8  Stated ball squeeze: 5 hold x10   06/22/23 Findings from evaluation discussed, pt educated on plan of care, HEP initiated.      PATIENT EDUCATION:  Education details: Access Code: [email or web address] Person educated: Patient Education method: Explanation, Demonstration, and Handouts Education comprehension: verbalized understanding and returned demonstration  HOME EXERCISE PROGRAM: Access Code: [email or web address] URL: [email or web address] Date: 07/18/2023 Prepared by: Burnard  Exercises - Seated Long Arc Quad  - 3 x daily - 7 x weekly - 2 sets - 10 reps - 5 hold - Seated March   - 3 x daily - 7 x weekly - 3 sets - 10 reps - Seated Heel Toe Raises   - 3 x daily - 7 x weekly - 2 sets - 10 reps - Seated Hamstring Stretch  - 3 x daily - 7 x weekly - 1 sets - 3 reps - 20 hold - Seated Figure 4 Piriformis Stretch  - 1 x daily - 7 x weekly - 3 sets - 10 reps - Sit to Stand Without Arm Support  - 2 x daily - 7 x weekly - 1-2 sets - 10 reps - Standing Hip Abduction with Counter Support  - 1 x daily - 7 x weekly - 1-2 sets - 10 reps - Standing Hip Extension with Counter Support  - 1 x daily - 7 x weekly - 1-2 sets - 10 reps - Heel Raises with Counter Support  - 1 x daily - 7 x weekly - 2 sets - 10 reps  ASSESSMENT:  CLINICAL IMPRESSION: Pt is more consistent with her HEP and is working to perform sit to stand with less UE support.  PT monitored for technique and fatigue.   Rt LE was more challenged with exercise today.  She will benefit from skilled PT to address strength, balance and flexibility to improve safety and function.   OBJECTIVE IMPAIRMENTS: decreased activity tolerance, decreased endurance, difficulty walking, decreased strength, postural dysfunction, and pain.   ACTIVITY LIMITATIONS: standing, stairs, transfers, and locomotion level  PARTICIPATION LIMITATIONS: meal prep, cleaning, laundry, shopping, and community activity  PERSONAL FACTORS: Age, Time since onset of injury/illness/exacerbation, and 1 comorbidity: osteopenia are also affecting patient's functional outcome.   REHAB POTENTIAL: Good  CLINICAL DECISION MAKING: Stable/uncomplicated  EVALUATION COMPLEXITY: Low   GOALS: Goals reviewed with patient? Yes  SHORT TERM GOALS: Target date: 07/20/2023   Be independent in initial HEP Baseline: Goal status: in progress   2.  Report > or = to 50% less use of Ues with sit to stand transition due to improved leg strength  Baseline:  Goal status: INITIAL  3.  Improve endurance to report < or 4/10 RPE with 6 min walk test  Baseline:  Goal status: INITIAL      LONG TERM GOALS: Target date: 08/17/2023    Be independent in advanced HEP Baseline:  Goal status: INITIAL  2.  Improve 6 min walk test to 1150 feet to improve community endurance  Baseline: 1008 feet  Goal status: INITIAL  3.  Report < or = 3/10 RPE with 6 min walk test due to improved endurance  Baseline:  Goal status: INITIAL  4.  Perform 5x sit to stand in < or = to 12 seconds to reduce falls risk Baseline:  Goal status: INITIAL  5.  Initiate a walking program and verbalize safe progression to improve community endurance  Baseline:  Goal status: INITIAL  6.  Report > or = to 50% improved confidence with balance in the community due to improved strength and stability  Baseline:  Goal status: INITIAL   PLAN:  PT FREQUENCY: 2x/week  PT DURATION: 8  weeks  PLANNED INTERVENTIONS: 97110-Therapeutic exercises, 97530- Therapeutic activity, 97112- Neuromuscular re-education, 97535- Self Care, 02859- Manual therapy, [phone]- Gait training, [phone]- Canalith repositioning, [id number]- Aquatic Therapy, [phone] (1-2 muscles), 20561 (3+ muscles)- Dry Needling, Patient/Family education, Balance training, Stair training, Taping, Joint mobilization, Vestibular training, Cryotherapy, and Moist heat  PLAN FOR NEXT SESSION: work on balance, hip and low back flexibility, postural strength, NuStep   Burnard Joy, PT 07/18/23 3:33 PM

## 2023-07-20 ENCOUNTER — Encounter: Payer: Self-pay | Admitting: Physical Therapy

## 2023-07-20 ENCOUNTER — Ambulatory Visit: Attending: Internal Medicine | Admitting: Physical Therapy

## 2023-07-20 DIAGNOSIS — M6281 Muscle weakness (generalized): Secondary | ICD-10-CM | POA: Insufficient documentation

## 2023-07-20 DIAGNOSIS — R2689 Other abnormalities of gait and mobility: Secondary | ICD-10-CM | POA: Diagnosis not present

## 2023-07-20 NOTE — Therapy (Signed)
 OUTPATIENT PHYSICAL THERAPY TREATMENT   Patient Name: Summer Collins MRN: [id number] DOB:10/10/1940, 83 y.o., female Today's Date: 07/20/2023  END OF SESSION:  PT End of Session - 07/20/23 1356     Visit Number 4    Date for PT Re-Evaluation 08/17/23    Authorization Type Medicare B, T[employer]are (KX at 15)    Progress Note Due on Visit 10    PT Start Time 1357    PT Stop Time 1445    PT Time Calculation (min) 48 min    Activity Tolerance Patient tolerated treatment well    Behavior During Therapy WFL for tasks assessed/performed             Past Medical History:  Diagnosis Date   Anxiety     on low dose zoloft  for a while   Arthritis    Cancer (HCC)    kidneys   Depression    Hyperlipidemia    Osteopenia    Vitamin D  deficiency    Past Surgical History:  Procedure Laterality Date   CARPAL TUNNEL RELEASE     DILATION AND CURETTAGE OF UTERUS     x 3   ROBOT ASSISTED LAPAROSCOPIC NEPHRECTOMY Right 07/20/2019   Procedure: XI ROBOTIC ASSISTED LAPAROSCOPIC RADICAL NEPHRECTOMY;  Surgeon: Alvaro Hummer, MD;  Location: [facility];  Service: Urology;  Laterality: Right;  3 HRS   Patient Active Problem List   Diagnosis Date Noted   Right renal mass 07/20/2019   Nasal congestion 02/04/2014   Estrogen deficiency 02/04/2014   Medication management 02/04/2014   Medicare annual wellness visit, subsequent 02/04/2014   Postural dizziness 11/30/2013   Knee swelling 05/09/2012   Adverse drug effect 05/09/2012   Ocular rosacea 12/15/2011   Hx of nonmelanoma skin cancer 12/15/2011   Nonspecific abnormal results of liver function study 10/31/2010   Screening for ischemic heart disease 10/31/2010   Preventative health care 10/31/2010   Hormone replacement therapy (postmenopausal) 08/22/2010   Hyperlipidemia    Osteopenia    Anxiety    Anxiety     PCP: Loreli Fallow, MD  REFERRING PROVIDER: Loreli Fallow, MD  REFERRING DIAG:  Diagnosis  R26.81 (ICD-10-CM) - Unsteadiness on  feet    THERAPY DIAG:  Muscle weakness (generalized)  Other abnormalities of gait and mobility  Rationale for Evaluation and Treatment: Rehabilitation  ONSET DATE: chronic balance deficits-has been furniture walking  SUBJECTIVE:   SUBJECTIVE STATEMENT: I was sore after last visit but I also ran errands after last visit.  PERTINENT HISTORY: Osteopenia, anxiety, kidney-Rt removed (07/22/19) PAIN: 07/13/23 Are you having pain? Yes: NPRS scale: 0/10 Pain location: bil hips Pain description: sore/aching  Aggravating factors: standing and walking, sidelying in bed  Relieving factors: movement   PRECAUTIONS: Fall  RED FLAGS: None   WEIGHT BEARING RESTRICTIONS: No  FALLS:  Has patient fallen in last 6 months? No  LIVING ENVIRONMENT: Lives with: lives alone Lives in: House/apartment Stairs: No Has following equipment at home: None  OCCUPATION: retired   PLOF: Independent and Leisure:  I need to walk  PATIENT GOALS: reduce falls risk, start walking   NEXT MD VISIT: none   OBJECTIVE:  Note: Objective measures were completed at Evaluation unless otherwise noted.  DIAGNOSTIC FINDINGS: bone scan for osteoporosis    COGNITION: Overall cognitive status: Within functional limits for tasks assessed     SENSATION: WFL    POSTURE: rounded shoulders and forward head  PALPATION: NA    LOWER EXTREMITY MMT:  Bil hips 4/5,  knees 4+/5, ankles 4/5   FUNCTIONAL TESTS:  5 times sit to stand: 15.15 seconds  Timed up and go (TUG): 9.53 seconds  6 minute walk test: 1008 ft with RPE 6/10- age related norm 1164 feet   GAIT: Distance walked: 100 feet  Assistive device utilized: None Level of assistance: Complete Independence Comments: mild staggering with fatigue, shortened step length                                                                                                                                 TREATMENT DATE:  07/20/23 NuStep L5 (then reduced  to L2 due to bil knee pain) 6' - PT present to monitor Figure 4 and hamstring stretch seated 2x20 seconds  Long arc quads 2# added 2x10 ER with 2# weight 2x10 Sit to stand x8 each position: 1) from mat + pad with hip hinge and fwd reach, 2) from mat table without pad, 3) from mat table holding 5lb Standing hip abd and ext1x10 each - Pt getting hip cramps today Rockerboard with bil UE support x1' March taps to 1st step alt LE with two finger support on each rail Fwd step up 6 stair with bil rail x10 each LE Stagger stance bil shoulder ext red tband x8 each stance position 3' walk: 584' RPE 5/10  07/18/23 NuStep Level 5x 6 min- PT present to discuss progress Long arc quads 2# added 2x10 ER with 2# weight 2x10 Figure 4 and hamstring stretch seated 2x20 seconds  Standing heel raises 2x10 Standing hip abduction 2x10 bil each, extensions   Sit to stand 2x10 Seated ball squeeze: 5 hold x10   07/13/23 NuStep Level 5x 6 min- PT present to discuss progress Long arc quads 2# added 2x10 Seated marching 2# added 2x10 Figure 4 and hamstring stretch seated 2x20 seconds  Seated heel/toe raises x15 Standing hip abduction 2x10 bil each, extensions   Sit to stand x8  Stated ball squeeze: 5 hold x10      PATIENT EDUCATION:  Education details: Access Code: [email or web address] Person educated: Patient Education method: Explanation, Facilities manager, and Handouts Education comprehension: verbalized understanding and returned demonstration  HOME EXERCISE PROGRAM: Access Code: [email or web address] URL: [email or web address] Date: 07/18/2023 Prepared by: Burnard  Exercises - Seated Long Arc Quad  - 3 x daily - 7 x weekly - 2 sets - 10 reps - 5 hold - Seated March   - 3 x daily - 7 x weekly - 3 sets - 10 reps - Seated Heel Toe Raises   - 3 x daily - 7 x weekly - 2 sets - 10 reps - Seated Hamstring Stretch  - 3 x daily - 7 x weekly - 1 sets - 3 reps - 20 hold - Seated Figure 4 Piriformis Stretch  - 1 x  daily - 7 x weekly - 3 sets - 10 reps - Sit to Stand Without Arm Support  - 2 x daily - 7  x weekly - 1-2 sets - 10 reps - Standing Hip Abduction with Counter Support  - 1 x daily - 7 x weekly - 1-2 sets - 10 reps - Standing Hip Extension with Counter Support  - 1 x daily - 7 x weekly - 1-2 sets - 10 reps - Heel Raises with Counter Support  - 1 x daily - 7 x weekly - 2 sets - 10 reps  ASSESSMENT:  CLINICAL IMPRESSION: Ameerah is making great progress toward her goals.  With body mechanics instruction today, she was able to use hip hinge and gluteals to perform sit to stand consistently today without using UE, meeting STG.  She was able to progress with balance and LE challenges today.  At end of session she performed 3 min of ambulation covering 38' with RPE of 5/10.  She will benefit from continued consistent efforts to progress walking endurance.    OBJECTIVE IMPAIRMENTS: decreased activity tolerance, decreased endurance, difficulty walking, decreased strength, postural dysfunction, and pain.   ACTIVITY LIMITATIONS: standing, stairs, transfers, and locomotion level  PARTICIPATION LIMITATIONS: meal prep, cleaning, laundry, shopping, and community activity  PERSONAL FACTORS: Age, Time since onset of injury/illness/exacerbation, and 1 comorbidity: osteopenia are also affecting patient's functional outcome.   REHAB POTENTIAL: Good  CLINICAL DECISION MAKING: Stable/uncomplicated  EVALUATION COMPLEXITY: Low   GOALS: Goals reviewed with patient? Yes  SHORT TERM GOALS: Target date: 07/20/2023   Be independent in initial HEP Baseline: Goal status: in progress   2.  Report > or = to 50% less use of Ues with sit to stand transition due to improved leg strength  Baseline:  Goal status: MET 07/20/23  3.  Improve endurance to report < or 4/10 RPE with 6 min walk test  Baseline:  Goal status: ONGOING 07/20/23      LONG TERM GOALS: Target date: 08/17/2023    Be independent in advanced  HEP Baseline:  Goal status: INITIAL  2.  Improve 6 min walk test to 1150 feet to improve community endurance  Baseline: 1008 feet  Goal status: INITIAL  3.  Report < or = 3/10 RPE with 6 min walk test due to improved endurance  Baseline:  Goal status: INITIAL  4.  Perform 5x sit to stand in < or = to 12 seconds to reduce falls risk Baseline:  Goal status: INITIAL  5.  Initiate a walking program and verbalize safe progression to improve community endurance  Baseline:  Goal status: INITIAL  6.  Report > or = to 50% improved confidence with balance in the community due to improved strength and stability  Baseline:  Goal status: INITIAL   PLAN:  PT FREQUENCY: 2x/week  PT DURATION: 8 weeks  PLANNED INTERVENTIONS: 97110-Therapeutic exercises, 97530- Therapeutic activity, 97112- Neuromuscular re-education, 97535- Self Care, 02859- Manual therapy, [phone]- Gait training, [phone]- Canalith repositioning, [id number]- Aquatic Therapy, [phone] (1-2 muscles), 20561 (3+ muscles)- Dry Needling, Patient/Family education, Balance training, Stair training, Taping, Joint mobilization, Vestibular training, Cryotherapy, and Moist heat  PLAN FOR NEXT SESSION: work on balance, hip and low back flexibility, postural strength, gait endurance, NuStep   Paola Flynt, PT 07/20/23 5:11 PM

## 2023-07-26 ENCOUNTER — Ambulatory Visit: Admitting: Physical Therapy

## 2023-07-26 ENCOUNTER — Encounter: Payer: Self-pay | Admitting: Physical Therapy

## 2023-07-26 DIAGNOSIS — R2689 Other abnormalities of gait and mobility: Secondary | ICD-10-CM | POA: Diagnosis not present

## 2023-07-26 DIAGNOSIS — M6281 Muscle weakness (generalized): Secondary | ICD-10-CM | POA: Diagnosis not present

## 2023-07-26 NOTE — Therapy (Signed)
 OUTPATIENT PHYSICAL THERAPY TREATMENT   Patient Name: Summer Collins MRN: [id number] DOB:02-14-1940, 83 y.o., female Today's Date: 07/26/2023  END OF SESSION:  PT End of Session - 07/26/23 1405     Visit Number 5    Date for PT Re-Evaluation 08/17/23    Authorization Type Medicare B, [employer] (KX at 15)    Progress Note Due on Visit 10    PT Start Time 1400    PT Stop Time 1444    PT Time Calculation (min) 44 min    Activity Tolerance Patient tolerated treatment well    Behavior During Therapy WFL for tasks assessed/performed              Past Medical History:  Diagnosis Date   Anxiety     on low dose zoloft  for a while   Arthritis    Cancer (HCC)    kidneys   Depression    Hyperlipidemia    Osteopenia    Vitamin D  deficiency    Past Surgical History:  Procedure Laterality Date   CARPAL TUNNEL RELEASE     DILATION AND CURETTAGE OF UTERUS     x 3   ROBOT ASSISTED LAPAROSCOPIC NEPHRECTOMY Right 07/20/2019   Procedure: XI ROBOTIC ASSISTED LAPAROSCOPIC RADICAL NEPHRECTOMY;  Surgeon: Alvaro Hummer, MD;  Location: [facility];  Service: Urology;  Laterality: Right;  3 HRS   Patient Active Problem List   Diagnosis Date Noted   Right renal mass 07/20/2019   Nasal congestion 02/04/2014   Estrogen deficiency 02/04/2014   Medication management 02/04/2014   Medicare annual wellness visit, subsequent 02/04/2014   Postural dizziness 11/30/2013   Knee swelling 05/09/2012   Adverse drug effect 05/09/2012   Ocular rosacea 12/15/2011   Hx of nonmelanoma skin cancer 12/15/2011   Nonspecific abnormal results of liver function study 10/31/2010   Screening for ischemic heart disease 10/31/2010   Preventative health care 10/31/2010   Hormone replacement therapy (postmenopausal) 08/22/2010   Hyperlipidemia    Osteopenia    Anxiety    Anxiety     PCP: Loreli Fallow, MD  REFERRING PROVIDER: Loreli Fallow, MD  REFERRING DIAG:  Diagnosis  R26.81 (ICD-10-CM) - Unsteadiness on  feet    THERAPY DIAG:  Muscle weakness (generalized)  Other abnormalities of gait and mobility  Rationale for Evaluation and Treatment: Rehabilitation  ONSET DATE: chronic balance deficits-has been furniture walking  SUBJECTIVE:   SUBJECTIVE STATEMENT: PT has been helpful so far.  I do tend to overdo with my errands and then by the time I get home I am exhausted. I am using what you taught me for how to stand up without using my hands.    PERTINENT HISTORY: Osteopenia, anxiety, kidney-Rt removed (07/22/19) PAIN: 07/13/23 Are you having pain? Yes: NPRS scale: 0/10 Pain location: bil hips Pain description: sore/aching  Aggravating factors: standing and walking, sidelying in bed  Relieving factors: movement   PRECAUTIONS: Fall  RED FLAGS: None   WEIGHT BEARING RESTRICTIONS: No  FALLS:  Has patient fallen in last 6 months? No  LIVING ENVIRONMENT: Lives with: lives alone Lives in: House/apartment Stairs: No Has following equipment at home: None  OCCUPATION: retired   PLOF: Independent and Leisure:  I need to walk  PATIENT GOALS: reduce falls risk, start walking   NEXT MD VISIT: none   OBJECTIVE:  Note: Objective measures were completed at Evaluation unless otherwise noted.  DIAGNOSTIC FINDINGS: bone scan for osteoporosis    COGNITION: Overall cognitive status: Within functional limits  for tasks assessed     SENSATION: WFL    POSTURE: rounded shoulders and forward head  PALPATION: NA    LOWER EXTREMITY MMT:  Bil hips 4/5, knees 4+/5, ankles 4/5   FUNCTIONAL TESTS:  5 times sit to stand: 15.15 seconds  Timed up and go (TUG): 9.53 seconds  6 minute walk test: 1008 ft with RPE 6/10- age related norm 1164 feet   GAIT: Distance walked: 100 feet  Assistive device utilized: None Level of assistance: Complete Independence Comments: mild staggering with fatigue, shortened step length                                                                                                                                  TREATMENT DATE:  07/26/23: NuStep L4 x6' PT present to discuss status LAQ 2.5lb 2x10, 1x10 ER seated March alt LE taps to 6 stair 2.5lb ankle weights with light bil rail Sit to stand from chair no UE x5, then hold 5lb x10 Seated 2lb ball hip to shoulder, V formation x10 each Fwd step up 6 stair with bil rail x10 each LE Standing hip abd and ext 1x10 each Bwd walking cable pulleys 10lb x7 reps - PT cued wider base of support 4' walk covering 417' - RPE 7/10  07/20/23 NuStep L5 (then reduced to L2 due to bil knee pain) 6' - PT present to monitor Figure 4 and hamstring stretch seated 2x20 seconds  Long arc quads 2# added 2x10 ER with 2# weight 2x10 Sit to stand x8 each position: 1) from mat + pad with hip hinge and fwd reach, 2) from mat table without pad, 3) from mat table holding 5lb Standing hip abd and ext1x10 each - Pt getting hip cramps today Rockerboard with bil UE support x1' March taps to 1st step alt LE with two finger support on each rail Fwd step up 6 stair with bil rail x10 each LE Stagger stance bil shoulder ext red tband x8 each stance position 3' walk: 584' RPE 5/10  07/18/23 NuStep Level 5x 6 min- PT present to discuss progress Long arc quads 2# added 2x10 ER with 2# weight 2x10 Figure 4 and hamstring stretch seated 2x20 seconds  Standing heel raises 2x10 Standing hip abduction 2x10 bil each, extensions   Sit to stand 2x10 Seated ball squeeze: 5 hold x10  PATIENT EDUCATION:  Education details: Access Code: [email or web address] Person educated: Patient Education method: Explanation, Demonstration, and Handouts Education comprehension: verbalized understanding and returned demonstration  HOME EXERCISE PROGRAM: Access Code: [email or web address] URL: [email or web address] Date: 07/18/2023 Prepared by: Burnard  Exercises - Seated Long Arc Quad  - 3 x daily - 7 x weekly - 2 sets - 10 reps - 5 hold - Seated  March   - 3 x daily - 7 x weekly - 3 sets - 10 reps - Seated Heel Toe Raises   - 3 x daily - 7 x weekly - 2  sets - 10 reps - Seated Hamstring Stretch  - 3 x daily - 7 x weekly - 1 sets - 3 reps - 20 hold - Seated Figure 4 Piriformis Stretch  - 1 x daily - 7 x weekly - 3 sets - 10 reps - Sit to Stand Without Arm Support  - 2 x daily - 7 x weekly - 1-2 sets - 10 reps - Standing Hip Abduction with Counter Support  - 1 x daily - 7 x weekly - 1-2 sets - 10 reps - Standing Hip Extension with Counter Support  - 1 x daily - 7 x weekly - 1-2 sets - 10 reps - Heel Raises with Counter Support  - 1 x daily - 7 x weekly - 2 sets - 10 reps  ASSESSMENT:  CLINICAL IMPRESSION: Cristalle is making great progress toward her goals.  She had a great work rate today and demo'd consistent proper body mechanics to allow for sit to stand with load (5lb) without UE assist.  She finished her session with a 4' walk covering 50' and RPE of 7/10.  She is on track to meet her goals.   OBJECTIVE IMPAIRMENTS: decreased activity tolerance, decreased endurance, difficulty walking, decreased strength, postural dysfunction, and pain.   ACTIVITY LIMITATIONS: standing, stairs, transfers, and locomotion level  PARTICIPATION LIMITATIONS: meal prep, cleaning, laundry, shopping, and community activity  PERSONAL FACTORS: Age, Time since onset of injury/illness/exacerbation, and 1 comorbidity: osteopenia are also affecting patient's functional outcome.   REHAB POTENTIAL: Good  CLINICAL DECISION MAKING: Stable/uncomplicated  EVALUATION COMPLEXITY: Low   GOALS: Goals reviewed with patient? Yes  SHORT TERM GOALS: Target date: 07/20/2023   Be independent in initial HEP Baseline: Goal status: in progress   2.  Report > or = to 50% less use of Ues with sit to stand transition due to improved leg strength  Baseline:  Goal status: MET 07/20/23  3.  Improve endurance to report < or 4/10 RPE with 6 min walk test  Baseline:  Goal  status: ONGOING 07/26/23      LONG TERM GOALS: Target date: 08/17/2023    Be independent in advanced HEP Baseline:  Goal status: INITIAL  2.  Improve 6 min walk test to 1150 feet to improve community endurance  Baseline: 1008 feet  Goal status: INITIAL  3.  Report < or = 3/10 RPE with 6 min walk test due to improved endurance  Baseline:  Goal status: INITIAL  4.  Perform 5x sit to stand in < or = to 12 seconds to reduce falls risk Baseline:  Goal status: INITIAL  5.  Initiate a walking program and verbalize safe progression to improve community endurance  Baseline:  Goal status: INITIAL  6.  Report > or = to 50% improved confidence with balance in the community due to improved strength and stability  Baseline:  Goal status: INITIAL   PLAN:  PT FREQUENCY: 2x/week  PT DURATION: 8 weeks  PLANNED INTERVENTIONS: 97110-Therapeutic exercises, 97530- Therapeutic activity, 97112- Neuromuscular re-education, 97535- Self Care, 02859- Manual therapy, [phone]- Gait training, [phone]- Canalith repositioning, [id number]- Aquatic Therapy, [phone] (1-2 muscles), 20561 (3+ muscles)- Dry Needling, Patient/Family education, Balance training, Stair training, Taping, Joint mobilization, Vestibular training, Cryotherapy, and Moist heat  PLAN FOR NEXT SESSION: check 5x STS for LTG, work on balance, hip and low back flexibility, postural strength, gait endurance, NuStep   Byrd Rushlow, PT 07/26/23 2:46 PM

## 2023-07-28 ENCOUNTER — Encounter: Admitting: Physical Therapy

## 2023-08-02 ENCOUNTER — Ambulatory Visit: Admitting: Physical Therapy

## 2023-08-02 ENCOUNTER — Encounter: Payer: Self-pay | Admitting: Physical Therapy

## 2023-08-02 DIAGNOSIS — M6281 Muscle weakness (generalized): Secondary | ICD-10-CM | POA: Diagnosis not present

## 2023-08-02 DIAGNOSIS — R2689 Other abnormalities of gait and mobility: Secondary | ICD-10-CM | POA: Diagnosis not present

## 2023-08-02 NOTE — Therapy (Signed)
 OUTPATIENT PHYSICAL THERAPY TREATMENT   Patient Name: Summer Collins MRN: [id number] DOB:May 10, 1940, 83 y.o., female Today's Date: 08/02/2023  END OF SESSION:  PT End of Session - 08/02/23 1357     Visit Number 6    Date for PT Re-Evaluation 08/17/23    Authorization Type Medicare B, T[employer]are (KX at 15)    Progress Note Due on Visit 10    PT Start Time 1357    PT Stop Time 1444    PT Time Calculation (min) 47 min    Activity Tolerance Patient tolerated treatment well    Behavior During Therapy WFL for tasks assessed/performed               Past Medical History:  Diagnosis Date   Anxiety     on low dose zoloft  for a while   Arthritis    Cancer (HCC)    kidneys   Depression    Hyperlipidemia    Osteopenia    Vitamin D  deficiency    Past Surgical History:  Procedure Laterality Date   CARPAL TUNNEL RELEASE     DILATION AND CURETTAGE OF UTERUS     x 3   ROBOT ASSISTED LAPAROSCOPIC NEPHRECTOMY Right 07/20/2019   Procedure: XI ROBOTIC ASSISTED LAPAROSCOPIC RADICAL NEPHRECTOMY;  Surgeon: Alvaro Hummer, MD;  Location: [facility];  Service: Urology;  Laterality: Right;  3 HRS   Patient Active Problem List   Diagnosis Date Noted   Right renal mass 07/20/2019   Nasal congestion 02/04/2014   Estrogen deficiency 02/04/2014   Medication management 02/04/2014   Medicare annual wellness visit, subsequent 02/04/2014   Postural dizziness 11/30/2013   Knee swelling 05/09/2012   Adverse drug effect 05/09/2012   Ocular rosacea 12/15/2011   Hx of nonmelanoma skin cancer 12/15/2011   Nonspecific abnormal results of liver function study 10/31/2010   Screening for ischemic heart disease 10/31/2010   Preventative health care 10/31/2010   Hormone replacement therapy (postmenopausal) 08/22/2010   Hyperlipidemia    Osteopenia    Anxiety    Anxiety     PCP: Loreli Fallow, MD  REFERRING PROVIDER: Loreli Fallow, MD  REFERRING DIAG:  Diagnosis  R26.81 (ICD-10-CM) - Unsteadiness  on feet    THERAPY DIAG:  Muscle weakness (generalized)  Other abnormalities of gait and mobility  Rationale for Evaluation and Treatment: Rehabilitation  ONSET DATE: chronic balance deficits-has been furniture walking  SUBJECTIVE:   SUBJECTIVE STATEMENT: I am doing my exercises.  I am needing to use less support on the railings with my stair exercises so I think my balance is improving.  PERTINENT HISTORY: Osteopenia, anxiety, kidney-Rt removed (07/22/19) PAIN: 07/13/23 Are you having pain? Yes: NPRS scale: 0/10 Pain location: bil hips Pain description: sore/aching  Aggravating factors: standing and walking, sidelying in bed  Relieving factors: movement   PRECAUTIONS: Fall  RED FLAGS: None   WEIGHT BEARING RESTRICTIONS: No  FALLS:  Has patient fallen in last 6 months? No  LIVING ENVIRONMENT: Lives with: lives alone Lives in: House/apartment Stairs: No Has following equipment at home: None  OCCUPATION: retired   PLOF: Independent and Leisure:  I need to walk  PATIENT GOALS: reduce falls risk, start walking   NEXT MD VISIT: none   OBJECTIVE:  Note: Objective measures were completed at Evaluation unless otherwise noted.  DIAGNOSTIC FINDINGS: bone scan for osteoporosis    COGNITION: Overall cognitive status: Within functional limits for tasks assessed     SENSATION: WFL    POSTURE: rounded shoulders and  forward head  PALPATION: NA    LOWER EXTREMITY MMT:  Bil hips 4/5, knees 4+/5, ankles 4/5   FUNCTIONAL TESTS:  08/02/23: 5 times sit to stand: 11.14 sec no UE support TUG: 6.34 sec no UE support with transfers and a strong pivot turn   Eval: 5 times sit to stand: 15.15 seconds  Timed up and go (TUG): 9.53 seconds  6 minute walk test: 1008 ft with RPE 6/10- age related norm 1164 feet   GAIT: Distance walked: 100 feet  Assistive device utilized: None Level of assistance: Complete Independence Comments: mild staggering with fatigue,  shortened step length                                                                                                                                 TREATMENT DATE:  08/02/23 NuStep L4 x 6' PT present to discuss status Counter heel raises x20 Sidestepping along counter yellow loop at ankles 3 laps Alt march taps to 6 step 3lb ankle weights x20 SLS with foot on 2nd step 2x20 each - needs single finger on rail with Lt SLS but no fingers with Rt SLS LAQ 3lb 2x10 Pallof press standing 1x10 green band Standing on foam pad bil red tband shoulder extension facing band, bil red tband shoulder ext facing away from band - close supervision for balance/safety Sit to stand 5lb kbell with chest press x10 Seated 2lb ball hip to shoulder, V formation x10 each 5x STS, TUG  07/26/23: NuStep L4 x6' PT present to discuss status LAQ 2.5lb 2x10, 1x10 ER seated March alt LE taps to 6 stair 2.5lb ankle weights with light bil rail Sit to stand from chair no UE x5, then hold 5lb x10 Seated 2lb ball hip to shoulder, V formation x10 each Fwd step up 6 stair with bil rail x10 each LE Standing hip abd and ext 1x10 each Bwd walking cable pulleys 10lb x7 reps - PT cued wider base of support 4' walk covering 417' - RPE 7/10  07/20/23 NuStep L5 (then reduced to L2 due to bil knee pain) 6' - PT present to monitor Figure 4 and hamstring stretch seated 2x20 seconds  Long arc quads 2# added 2x10 ER with 2# weight 2x10 Sit to stand x8 each position: 1) from mat + pad with hip hinge and fwd reach, 2) from mat table without pad, 3) from mat table holding 5lb Standing hip abd and ext1x10 each - Pt getting hip cramps today Rockerboard with bil UE support x1' March taps to 1st step alt LE with two finger support on each rail Fwd step up 6 stair with bil rail x10 each LE Stagger stance bil shoulder ext red tband x8 each stance position 3' walk: 584' RPE 5/10  07/18/23 NuStep Level 5x 6 min- PT present to discuss  progress Long arc quads 2# added 2x10 ER with 2# weight 2x10 Figure 4 and hamstring stretch seated 2x20 seconds  Standing heel raises 2x10 Standing hip abduction 2x10 bil each, extensions   Sit to stand 2x10 Seated ball squeeze: 5 hold x10  PATIENT EDUCATION:  Education details: Access Code: [email or web address] Person educated: Patient Education method: Explanation, Demonstration, and Handouts Education comprehension: verbalized understanding and returned demonstration  HOME EXERCISE PROGRAM: Access Code: [email or web address] URL: [email or web address] Date: 07/18/2023 Prepared by: Burnard  Exercises - Seated Long Arc Quad  - 3 x daily - 7 x weekly - 2 sets - 10 reps - 5 hold - Seated March   - 3 x daily - 7 x weekly - 3 sets - 10 reps - Seated Heel Toe Raises   - 3 x daily - 7 x weekly - 2 sets - 10 reps - Seated Hamstring Stretch  - 3 x daily - 7 x weekly - 1 sets - 3 reps - 20 hold - Seated Figure 4 Piriformis Stretch  - 1 x daily - 7 x weekly - 3 sets - 10 reps - Sit to Stand Without Arm Support  - 2 x daily - 7 x weekly - 1-2 sets - 10 reps - Standing Hip Abduction with Counter Support  - 1 x daily - 7 x weekly - 1-2 sets - 10 reps - Standing Hip Extension with Counter Support  - 1 x daily - 7 x weekly - 1-2 sets - 10 reps - Heel Raises with Counter Support  - 1 x daily - 7 x weekly - 2 sets - 10 reps  ASSESSMENT:  CLINICAL IMPRESSION: Summer Collins is making great progress toward her goals.  She met 5x STS goal and also showed signif improvement in TUG today compared to eval.  She was challenged standing on foam with bil UE tband exercise overlay for balance with PT providing close supervision for safety.  Pt able to stand in SLS with foot on 2nd step with no UE support for most of 20 sec rounds.  OBJECTIVE IMPAIRMENTS: decreased activity tolerance, decreased endurance, difficulty walking, decreased strength, postural dysfunction, and pain.   ACTIVITY LIMITATIONS: standing, stairs,  transfers, and locomotion level  PARTICIPATION LIMITATIONS: meal prep, cleaning, laundry, shopping, and community activity  PERSONAL FACTORS: Age, Time since onset of injury/illness/exacerbation, and 1 comorbidity: osteopenia are also affecting patient's functional outcome.   REHAB POTENTIAL: Good  CLINICAL DECISION MAKING: Stable/uncomplicated  EVALUATION COMPLEXITY: Low   GOALS: Goals reviewed with patient? Yes  SHORT TERM GOALS: Target date: 07/20/2023   Be independent in initial HEP Baseline: Goal status: in progress   2.  Report > or = to 50% less use of Ues with sit to stand transition due to improved leg strength  Baseline:  Goal status: MET 07/20/23  3.  Improve endurance to report < or 4/10 RPE with 6 min walk test  Baseline:  Goal status: ONGOING 07/26/23      LONG TERM GOALS: Target date: 08/17/2023    Be independent in advanced HEP Baseline:  Goal status: ONGOING  2.  Improve 6 min walk test to 1150 feet to improve community endurance  Baseline: 1008 feet  Goal status: ONGOING  3.  Report < or = 3/10 RPE with 6 min walk test due to improved endurance  Baseline:  Goal status: INITIAL  4.  Perform 5x sit to stand in < or = to 12 seconds to reduce falls risk Baseline:  Goal status: MET 08/02/23  5.  Initiate a walking program and verbalize safe progression to improve community endurance  Baseline:  Goal status: INITIAL  6.  Report > or = to 50% improved confidence with balance in the community due to improved strength and stability  Baseline:  Goal status: ONGOING 7/15   PLAN:  PT FREQUENCY: 2x/week  PT DURATION: 8 weeks  PLANNED INTERVENTIONS: 97110-Therapeutic exercises, 97530- Therapeutic activity, 97112- Neuromuscular re-education, 97535- Self Care, 02859- Manual therapy, [phone]- Gait training, [phone]- Canalith repositioning, [phone]- Aquatic Therapy, [phone] (1-2 muscles), 20561 (3+ muscles)- Dry Needling, Patient/Family education, Balance  training, Stair training, Taping, Joint mobilization, Vestibular training, Cryotherapy, and Moist heat  PLAN FOR NEXT SESSION: work on gait endurance for , NuStep, work on balance and trunk reactions standing on foam with UE exercise with close supervision, consider adding hurdle step overs, multi-directional stepping to disc with UE reach   [employer], PT 08/02/23 2:46 PM

## 2023-08-04 NOTE — Therapy (Signed)
 OUTPATIENT PHYSICAL THERAPY TREATMENT   Patient Name: DERIONNA SALVADOR MRN: [id number] DOB:1940/07/08, 83 y.o., female Today's Date: 08/05/2023  END OF SESSION:  PT End of Session - 08/05/23 1059     Visit Number 7    Date for PT Re-Evaluation 08/17/23    Authorization Type Medicare B, [employer] (KX at 15)    Progress Note Due on Visit 10    PT Start Time 1058    PT Stop Time 1145    PT Time Calculation (min) 47 min    Activity Tolerance Patient tolerated treatment well    Behavior During Therapy WFL for tasks assessed/performed                Past Medical History:  Diagnosis Date   Anxiety     on low dose zoloft  for a while   Arthritis    Cancer (HCC)    kidneys   Depression    Hyperlipidemia    Osteopenia    Vitamin D  deficiency    Past Surgical History:  Procedure Laterality Date   CARPAL TUNNEL RELEASE     DILATION AND CURETTAGE OF UTERUS     x 3   ROBOT ASSISTED LAPAROSCOPIC NEPHRECTOMY Right 07/20/2019   Procedure: XI ROBOTIC ASSISTED LAPAROSCOPIC RADICAL NEPHRECTOMY;  Surgeon: Alvaro Hummer, MD;  Location: [facility];  Service: Urology;  Laterality: Right;  3 HRS   Patient Active Problem List   Diagnosis Date Noted   Right renal mass 07/20/2019   Nasal congestion 02/04/2014   Estrogen deficiency 02/04/2014   Medication management 02/04/2014   Medicare annual wellness visit, subsequent 02/04/2014   Postural dizziness 11/30/2013   Knee swelling 05/09/2012   Adverse drug effect 05/09/2012   Ocular rosacea 12/15/2011   Hx of nonmelanoma skin cancer 12/15/2011   Nonspecific abnormal results of liver function study 10/31/2010   Screening for ischemic heart disease 10/31/2010   Preventative health care 10/31/2010   Hormone replacement therapy (postmenopausal) 08/22/2010   Hyperlipidemia    Osteopenia    Anxiety    Anxiety     PCP: Loreli Fallow, MD  REFERRING PROVIDER: Loreli Fallow, MD  REFERRING DIAG:  Diagnosis  R26.81 (ICD-10-CM) -  Unsteadiness on feet    THERAPY DIAG:  Muscle weakness (generalized)  Other abnormalities of gait and mobility  Rationale for Evaluation and Treatment: Rehabilitation  ONSET DATE: chronic balance deficits-has been furniture walking  SUBJECTIVE:   SUBJECTIVE STATEMENT: It takes me a while to get going in the morning. I stumble around using the furniture.   PERTINENT HISTORY: Osteopenia, anxiety, kidney-Rt removed (07/22/19) PAIN: 07/13/23 Are you having pain? Yes: NPRS scale: 0/10 Pain location: bil hips Pain description: sore/aching  Aggravating factors: standing and walking, sidelying in bed  Relieving factors: movement   PRECAUTIONS: Fall  RED FLAGS: None   WEIGHT BEARING RESTRICTIONS: No  FALLS:  Has patient fallen in last 6 months? No  LIVING ENVIRONMENT: Lives with: lives alone Lives in: House/apartment Stairs: No Has following equipment at home: None  OCCUPATION: retired   PLOF: Independent and Leisure:  I need to walk  PATIENT GOALS: reduce falls risk, start walking   NEXT MD VISIT: none   OBJECTIVE:  Note: Objective measures were completed at Evaluation unless otherwise noted.  DIAGNOSTIC FINDINGS: bone scan for osteoporosis    COGNITION: Overall cognitive status: Within functional limits for tasks assessed     SENSATION: WFL    POSTURE: rounded shoulders and forward head  PALPATION: NA  LOWER EXTREMITY MMT:  Bil hips 4/5, knees 4+/5, ankles 4/5   FUNCTIONAL TESTS:  08/02/23: 5 times sit to stand: 11.14 sec no UE support TUG: 6.34 sec no UE support with transfers and a strong pivot turn   Eval: 5 times sit to stand: 15.15 seconds  Timed up and go (TUG): 9.53 seconds  6 minute walk test: 1008 ft with RPE 6/10- age related norm 1164 feet   GAIT: Distance walked: 100 feet  Assistive device utilized: None Level of assistance: Complete Independence Comments: mild staggering with fatigue, shortened step length                                                                                                                                  TREATMENT DATE:  08/05/23 NuStep L4 x 6' PT present to discuss status heel raises x20 5# hand weights no UE support SBA Heel raise at barre with ball between ankles to prevent lateral roll out x 10 Sidestepping along counter yellow loop at ankles 4 laps cues to pick up feet  and keep tension on loop Alt march taps to 6 step 3lb ankle weights x20 SLS with foot on 2nd step 2x20 each cues to squeeze gluteals LAQ 3lb 2x10 INCREASE WT NEXT TIME Pallof press standing 1x10 green band - used dowel vertically for easier hold Standing on foam pad bil green rows and ext x 15 ea On foam narrow BOS with head turns x 10 B Sit to stand 6lb x 10, then with with chest press x10 Standing up and over hurdle R only x 5 Seated up and overs R leg x 10     08/02/23 NuStep L4 x 6' PT present to discuss status Counter heel raises x20 Sidestepping along counter yellow loop at ankles 3 laps Alt march taps to 6 step 3lb ankle weights x20 SLS with foot on 2nd step 2x20 each - needs single finger on rail with Lt SLS but no fingers with Rt SLS LAQ 3lb 2x10 Pallof press standing 1x10 green band Standing on foam pad bil red tband shoulder extension facing band, bil red tband shoulder ext facing away from band - close supervision for balance/safety Sit to stand 5lb kbell with chest press x10 Seated 2lb ball hip to shoulder, V formation x10 each 5x STS, TUG  07/26/23: NuStep L4 x6' PT present to discuss status LAQ 2.5lb 2x10, 1x10 ER seated March alt LE taps to 6 stair 2.5lb ankle weights with light bil rail Sit to stand from chair no UE x5, then hold 5lb x10 Seated 2lb ball hip to shoulder, V formation x10 each Fwd step up 6 stair with bil rail x10 each LE Standing hip abd and ext 1x10 each Bwd walking cable pulleys 10lb x7 reps - PT cued wider base of support 4' walk covering 417' - RPE  7/10  07/20/23 NuStep L5 (then reduced to L2 due to bil knee pain) 6' -  PT present to monitor Figure 4 and hamstring stretch seated 2x20 seconds  Long arc quads 2# added 2x10 ER with 2# weight 2x10 Sit to stand x8 each position: 1) from mat + pad with hip hinge and fwd reach, 2) from mat table without pad, 3) from mat table holding 5lb Standing hip abd and ext1x10 each - Pt getting hip cramps today Rockerboard with bil UE support x1' March taps to 1st step alt LE with two finger support on each rail Fwd step up 6 stair with bil rail x10 each LE Stagger stance bil shoulder ext red tband x8 each stance position 3' walk: 584' RPE 5/10  07/18/23 NuStep Level 5x 6 min- PT present to discuss progress Long arc quads 2# added 2x10 ER with 2# weight 2x10 Figure 4 and hamstring stretch seated 2x20 seconds  Standing heel raises 2x10 Standing hip abduction 2x10 bil each, extensions   Sit to stand 2x10 Seated ball squeeze: 5 hold x10  PATIENT EDUCATION:  Education details: Access Code: [email or web address] Person educated: Patient Education method: Explanation, Demonstration, and Handouts Education comprehension: verbalized understanding and returned demonstration  HOME EXERCISE PROGRAM: Access Code: [email or web address] URL: [email or web address] Date: 07/18/2023 Prepared by: Burnard  Exercises - Seated Long Arc Quad  - 3 x daily - 7 x weekly - 2 sets - 10 reps - 5 hold - Seated March   - 3 x daily - 7 x weekly - 3 sets - 10 reps - Seated Heel Toe Raises   - 3 x daily - 7 x weekly - 2 sets - 10 reps - Seated Hamstring Stretch  - 3 x daily - 7 x weekly - 1 sets - 3 reps - 20 hold - Seated Figure 4 Piriformis Stretch  - 1 x daily - 7 x weekly - 3 sets - 10 reps - Sit to Stand Without Arm Support  - 2 x daily - 7 x weekly - 1-2 sets - 10 reps - Standing Hip Abduction with Counter Support  - 1 x daily - 7 x weekly - 1-2 sets - 10 reps - Standing Hip Extension with Counter Support  - 1 x daily - 7 x  weekly - 1-2 sets - 10 reps - Heel Raises with Counter Support  - 1 x daily - 7 x weekly - 2 sets - 10 reps  ASSESSMENT:  CLINICAL IMPRESSION: Patient demonstrated improved balance with exercises today. She does well with verbal cues to engage necessary muscles. R hip challenged with sitting up and overs. Narrow base of support very challenging. Close supervision with all TE.   OBJECTIVE IMPAIRMENTS: decreased activity tolerance, decreased endurance, difficulty walking, decreased strength, postural dysfunction, and pain.   ACTIVITY LIMITATIONS: standing, stairs, transfers, and locomotion level  PARTICIPATION LIMITATIONS: meal prep, cleaning, laundry, shopping, and community activity  PERSONAL FACTORS: Age, Time since onset of injury/illness/exacerbation, and 1 comorbidity: osteopenia are also affecting patient's functional outcome.   REHAB POTENTIAL: Good  CLINICAL DECISION MAKING: Stable/uncomplicated  EVALUATION COMPLEXITY: Low   GOALS: Goals reviewed with patient? Yes  SHORT TERM GOALS: Target date: 07/20/2023   Be independent in initial HEP Baseline: Goal status: in progress   2.  Report > or = to 50% less use of Ues with sit to stand transition due to improved leg strength  Baseline:  Goal status: MET 07/20/23  3.  Improve endurance to report < or 4/10 RPE with 6 min walk test  Baseline:  Goal status: ONGOING 07/26/23  LONG TERM GOALS: Target date: 08/17/2023    Be independent in advanced HEP Baseline:  Goal status: ONGOING  2.  Improve 6 min walk test to 1150 feet to improve community endurance  Baseline: 1008 feet  Goal status: ONGOING  3.  Report < or = 3/10 RPE with 6 min walk test due to improved endurance  Baseline:  Goal status: INITIAL  4.  Perform 5x sit to stand in < or = to 12 seconds to reduce falls risk Baseline:  Goal status: MET 08/02/23  5.  Initiate a walking program and verbalize safe progression to improve community endurance   Baseline:  Goal status: INITIAL  6.  Report > or = to 50% improved confidence with balance in the community due to improved strength and stability  Baseline:  Goal status: ONGOING 7/15   PLAN:  PT FREQUENCY: 2x/week  PT DURATION: 8 weeks  PLANNED INTERVENTIONS: 97110-Therapeutic exercises, 97530- Therapeutic activity, 97112- Neuromuscular re-education, 97535- Self Care, [id number]- Manual therapy, [phone]- Gait training, [phone]- Canalith repositioning, [phone]- Aquatic Therapy, [phone] (1-2 muscles), [phone] (3+ muscles)- Dry Needling, Patient/Family education, Balance training, Stair training, Taping, Joint mobilization, Vestibular training, Cryotherapy, and Moist heat  PLAN FOR NEXT SESSION: work on gait endurance for , NuStep, work on balance and trunk reactions standing on foam with UE exercise with close supervision, consider adding hurdle step overs, multi-directional stepping to disc with UE reach   Mliss Cummins, PT  08/05/23 11:58 AM

## 2023-08-05 ENCOUNTER — Encounter: Payer: Self-pay | Admitting: Physical Therapy

## 2023-08-05 ENCOUNTER — Ambulatory Visit: Admitting: Physical Therapy

## 2023-08-05 DIAGNOSIS — R2689 Other abnormalities of gait and mobility: Secondary | ICD-10-CM | POA: Diagnosis not present

## 2023-08-05 DIAGNOSIS — M6281 Muscle weakness (generalized): Secondary | ICD-10-CM | POA: Diagnosis not present

## 2023-08-08 ENCOUNTER — Encounter: Payer: Self-pay | Admitting: Physical Therapy

## 2023-08-08 ENCOUNTER — Ambulatory Visit: Admitting: Physical Therapy

## 2023-08-08 DIAGNOSIS — R2689 Other abnormalities of gait and mobility: Secondary | ICD-10-CM

## 2023-08-08 DIAGNOSIS — M6281 Muscle weakness (generalized): Secondary | ICD-10-CM

## 2023-08-08 NOTE — Therapy (Signed)
 OUTPATIENT PHYSICAL THERAPY TREATMENT   Patient Name: Summer Collins MRN: [id number] DOB:May 20, 1940, 83 y.o., female Today's Date: 08/08/2023  END OF SESSION:  PT End of Session - 08/08/23 1400     Visit Number 8    Date for PT Re-Evaluation 08/17/23    Progress Note Due on Visit 10    PT Start Time 1400    PT Stop Time 1445    PT Time Calculation (min) 45 min    Activity Tolerance Patient tolerated treatment well    Behavior During Therapy WFL for tasks assessed/performed                 Past Medical History:  Diagnosis Date   Anxiety     on low dose zoloft  for a while   Arthritis    Cancer (HCC)    kidneys   Depression    Hyperlipidemia    Osteopenia    Vitamin D  deficiency    Past Surgical History:  Procedure Laterality Date   CARPAL TUNNEL RELEASE     DILATION AND CURETTAGE OF UTERUS     x 3   ROBOT ASSISTED LAPAROSCOPIC NEPHRECTOMY Right 07/20/2019   Procedure: XI ROBOTIC ASSISTED LAPAROSCOPIC RADICAL NEPHRECTOMY;  Surgeon: Alvaro Hummer, MD;  Location: [facility];  Service: Urology;  Laterality: Right;  3 HRS   Patient Active Problem List   Diagnosis Date Noted   Right renal mass 07/20/2019   Nasal congestion 02/04/2014   Estrogen deficiency 02/04/2014   Medication management 02/04/2014   Medicare annual wellness visit, subsequent 02/04/2014   Postural dizziness 11/30/2013   Knee swelling 05/09/2012   Adverse drug effect 05/09/2012   Ocular rosacea 12/15/2011   Hx of nonmelanoma skin cancer 12/15/2011   Nonspecific abnormal results of liver function study 10/31/2010   Screening for ischemic heart disease 10/31/2010   Preventative health care 10/31/2010   Hormone replacement therapy (postmenopausal) 08/22/2010   Hyperlipidemia    Osteopenia    Anxiety    Anxiety     PCP: Loreli Fallow, MD  REFERRING PROVIDER: Loreli Fallow, MD  REFERRING DIAG:  Diagnosis  R26.81 (ICD-10-CM) - Unsteadiness on feet    THERAPY DIAG:  Muscle weakness  (generalized)  Other abnormalities of gait and mobility  Rationale for Evaluation and Treatment: Rehabilitation  ONSET DATE: chronic balance deficits-has been furniture walking  SUBJECTIVE:   SUBJECTIVE STATEMENT: I was so tired after last visit.   PERTINENT HISTORY: Osteopenia, anxiety, kidney-Rt removed (07/22/19) PAIN: 07/13/23 Are you having pain? Yes: NPRS scale: 0/10 Pain location: bil hips Pain description: sore/aching  Aggravating factors: standing and walking, sidelying in bed  Relieving factors: movement   PRECAUTIONS: Fall  RED FLAGS: None   WEIGHT BEARING RESTRICTIONS: No  FALLS:  Has patient fallen in last 6 months? No  LIVING ENVIRONMENT: Lives with: lives alone Lives in: House/apartment Stairs: No Has following equipment at home: None  OCCUPATION: retired   PLOF: Independent and Leisure:  I need to walk  PATIENT GOALS: reduce falls risk, start walking   NEXT MD VISIT: none   OBJECTIVE:  Note: Objective measures were completed at Evaluation unless otherwise noted.  DIAGNOSTIC FINDINGS: bone scan for osteoporosis    COGNITION: Overall cognitive status: Within functional limits for tasks assessed     SENSATION: WFL    POSTURE: rounded shoulders and forward head  PALPATION: NA    LOWER EXTREMITY MMT:  Bil hips 4/5, knees 4+/5, ankles 4/5   FUNCTIONAL TESTS:  08/08/23  -  1148 ft RPE 5/10  08/02/23: 5 times sit to stand: 11.14 sec no UE support TUG: 6.34 sec no UE support with transfers and a strong pivot turn   Eval: 5 times sit to stand: 15.15 seconds  Timed up and go (TUG): 9.53 seconds  6 minute walk test: 1008 ft with RPE 6/10- age related norm 1164 feet   GAIT: Distance walked: 100 feet  Assistive device utilized: None Level of assistance: Complete Independence Comments: mild staggering with fatigue, shortened step length                                                                                                                                  TREATMENT DATE:  08/08/23 with RPE (see objective) heel raises x20 5# hand weights no UE support Sit to stand 5lb with chest press x10 Heel raise at barre with ball between ankles to prevent lateral roll out x 10 Sidestepping along counter yellow loop at ankles 4 laps cues to pick up feet  and keep tension on loop Alt march taps to 6 step 3lb ankle weights x20 SLS with foot on 2nd step 2x20 each cues to squeeze gluteals; then with head turns (added to HEP) LAQ 4lb 2x10  Seated up and overs B LE x 10, then with 3# wt to fatigue B Lateral and fwd hurdle walkovers 4 hurdles 4 laps each    08/05/23 NuStep L4 x 6' PT present to discuss status heel raises x20 5# hand weights no UE support SBA Heel raise at barre with ball between ankles to prevent lateral roll out x 10 Sidestepping along counter yellow loop at ankles 4 laps cues to pick up feet  and keep tension on loop Alt march taps to 6 step 3lb ankle weights x20 SLS with foot on 2nd step 2x20 each cues to squeeze gluteals LAQ 3lb 2x10 INCREASE WT NEXT TIME Pallof press standing 1x10 green band - used dowel vertically for easier hold Standing on foam pad bil green rows and ext x 15 ea On foam narrow BOS with head turns x 10 B Sit to stand 6lb x 10, then with with chest press x10 Standing up and over hurdle R only x 5 Seated up and overs R leg x 10   08/02/23 NuStep L4 x 6' PT present to discuss status Counter heel raises x20 Sidestepping along counter yellow loop at ankles 3 laps Alt march taps to 6 step 3lb ankle weights x20 SLS with foot on 2nd step 2x20 each - needs single finger on rail with Lt SLS but no fingers with Rt SLS LAQ 3lb 2x10 Pallof press standing 1x10 green band Standing on foam pad bil red tband shoulder extension facing band, bil red tband shoulder ext facing away from band - close supervision for balance/safety Sit to stand 5lb kbell with chest press x10 Seated  2lb ball hip to shoulder, V formation x10 each 5x STS,  TUG  07/26/23: NuStep L4 x6' PT present to discuss status LAQ 2.5lb 2x10, 1x10 ER seated March alt LE taps to 6 stair 2.5lb ankle weights with light bil rail Sit to stand from chair no UE x5, then hold 5lb x10 Seated 2lb ball hip to shoulder, V formation x10 each Fwd step up 6 stair with bil rail x10 each LE Standing hip abd and ext 1x10 each Bwd walking cable pulleys 10lb x7 reps - PT cued wider base of support 4' walk covering 417' - RPE 7/10  PATIENT EDUCATION:  Education details: Access Code: [email or web address] Person educated: Patient Education method: Explanation, Demonstration, and Handouts Education comprehension: verbalized understanding and returned demonstration  HOME EXERCISE PROGRAM: Access Code: [email or web address] URL: [email or web address] Date: 08/08/2023 Prepared by: Mliss  Exercises - Seated Long Arc Quad  - 3 x daily - 7 x weekly - 2 sets - 10 reps - 5 hold - Seated March   - 3 x daily - 7 x weekly - 3 sets - 10 reps - Seated Heel Toe Raises   - 3 x daily - 7 x weekly - 2 sets - 10 reps - Seated Hamstring Stretch  - 3 x daily - 7 x weekly - 1 sets - 3 reps - 20 hold - Seated Figure 4 Piriformis Stretch  - 1 x daily - 7 x weekly - 3 sets - 10 reps - Sit to Stand Without Arm Support  - 2 x daily - 7 x weekly - 1-2 sets - 10 reps - Standing Hip Abduction with Counter Support  - 1 x daily - 7 x weekly - 1-2 sets - 10 reps - Standing Hip Extension with Counter Support  - 1 x daily - 7 x weekly - 1-2 sets - 10 reps - Heel Raises with Counter Support  - 1 x daily - 7 x weekly - 2 sets - 10 reps - Forward Step Up  - 1 x daily - 7 x weekly - 2 sets - 10 reps  ASSESSMENT:  CLINICAL IMPRESSION: Patient very close to meeting goal and RPE was less today. She continues to demonstrate deviation from path during gait. She will reach to something if she comes close. She continues to demonstrate seated hip flexor weakness  with up and overs. She did well with hurdles today with no UE assist, but is slow and cautious. Head turns with split stance at stairs challenged her, but no LOB occurred.  Close supervision with all TE. She continues to demonstrate potential for improvement and would benefit from continued skilled therapy to address impairments.    OBJECTIVE IMPAIRMENTS: decreased activity tolerance, decreased endurance, difficulty walking, decreased strength, postural dysfunction, and pain.   ACTIVITY LIMITATIONS: standing, stairs, transfers, and locomotion level  PARTICIPATION LIMITATIONS: meal prep, cleaning, laundry, shopping, and community activity  PERSONAL FACTORS: Age, Time since onset of injury/illness/exacerbation, and 1 comorbidity: osteopenia are also affecting patient's functional outcome.   REHAB POTENTIAL: Good  CLINICAL DECISION MAKING: Stable/uncomplicated  EVALUATION COMPLEXITY: Low   GOALS: Goals reviewed with patient? Yes  SHORT TERM GOALS: Target date: 07/20/2023   Be independent in initial HEP Baseline: Goal status: in progress   2.  Report > or = to 50% less use of Ues with sit to stand transition due to improved leg strength  Baseline:  Goal status: MET 07/20/23  3.  Improve endurance to report < or 4/10 RPE with 6 min walk test  Baseline:  Goal status: ONGOING 07/26/23  LONG TERM GOALS: Target date: 08/17/2023    Be independent in advanced HEP Baseline:  Goal status: ONGOING  2.  Improve 6 min walk test to 1150 feet to improve community endurance  Baseline: 1008 feet  Goal status: ONGOING 08/08/23 1148 ft  3.  Report < or = 3/10 RPE with 6 min walk test due to improved endurance  Baseline:  Goal status: IN PROGRESS 08/08/23  4.  Perform 5x sit to stand in < or = to 12 seconds to reduce falls risk Baseline:  Goal status: MET 08/02/23  5.  Initiate a walking program and verbalize safe progression to improve community endurance  Baseline:  Goal status:  INITIAL  6.  Report > or = to 50% improved confidence with balance in the community due to improved strength and stability  Baseline:  Goal status: ONGOING 7/15   PLAN:  PT FREQUENCY: 2x/week  PT DURATION: 8 weeks  PLANNED INTERVENTIONS: 97110-Therapeutic exercises, 97530- Therapeutic activity, 97112- Neuromuscular re-education, 97535- Self Care, 02859- Manual therapy, [id number]- Gait training, [phone]- Canalith repositioning, [id number]- Aquatic Therapy, [phone] (1-2 muscles), 20561 (3+ muscles)- Dry Needling, Patient/Family education, Balance training, Stair training, Taping, Joint mobilization, Vestibular training, Cryotherapy, and Moist heat  PLAN FOR NEXT SESSION: continue split stance, narrow BOS, adding head turns with balance. Seated hip flexion strengthening.    Mliss Cummins, PT  08/08/23 4:34 PM

## 2023-08-10 ENCOUNTER — Ambulatory Visit

## 2023-08-10 DIAGNOSIS — R2689 Other abnormalities of gait and mobility: Secondary | ICD-10-CM | POA: Diagnosis not present

## 2023-08-10 DIAGNOSIS — M6281 Muscle weakness (generalized): Secondary | ICD-10-CM

## 2023-08-10 NOTE — Therapy (Signed)
 OUTPATIENT PHYSICAL THERAPY TREATMENT   Patient Name: Summer Collins MRN: [id number] DOB:December 07, 1940, 83 y.o., female Today's Date: 08/10/2023  END OF SESSION:  PT End of Session - 08/10/23 1617     Visit Number 9    Date for PT Re-Evaluation 08/17/23    Authorization Type Medicare B, Tricare (KX at 15)    Progress Note Due on Visit 10    PT Start Time 0330    PT Stop Time 0415    PT Time Calculation (min) 45 min    Activity Tolerance Patient tolerated treatment well    Behavior During Therapy WFL for tasks assessed/performed                  Past Medical History:  Diagnosis Date   Anxiety     on low dose zoloft  for a while   Arthritis    Cancer (HCC)    kidneys   Depression    Hyperlipidemia    Osteopenia    Vitamin D  deficiency    Past Surgical History:  Procedure Laterality Date   CARPAL TUNNEL RELEASE     DILATION AND CURETTAGE OF UTERUS     x 3   ROBOT ASSISTED LAPAROSCOPIC NEPHRECTOMY Right 07/20/2019   Procedure: XI ROBOTIC ASSISTED LAPAROSCOPIC RADICAL NEPHRECTOMY;  Surgeon: Alvaro Hummer, MD;  Location: [facility];  Service: Urology;  Laterality: Right;  3 HRS   Patient Active Problem List   Diagnosis Date Noted   Right renal mass 07/20/2019   Nasal congestion 02/04/2014   Estrogen deficiency 02/04/2014   Medication management 02/04/2014   Medicare annual wellness visit, subsequent 02/04/2014   Postural dizziness 11/30/2013   Knee swelling 05/09/2012   Adverse drug effect 05/09/2012   Ocular rosacea 12/15/2011   Hx of nonmelanoma skin cancer 12/15/2011   Nonspecific abnormal results of liver function study 10/31/2010   Screening for ischemic heart disease 10/31/2010   Preventative health care 10/31/2010   Hormone replacement therapy (postmenopausal) 08/22/2010   Hyperlipidemia    Osteopenia    Anxiety    Anxiety     PCP: Loreli Fallow, MD  REFERRING PROVIDER: Loreli Fallow, MD  REFERRING DIAG:  Diagnosis  R26.81 (ICD-10-CM) -  Unsteadiness on feet    THERAPY DIAG:  Muscle weakness (generalized)  Other abnormalities of gait and mobility  Rationale for Evaluation and Treatment: Rehabilitation  ONSET DATE: chronic balance deficits-has been furniture walking  SUBJECTIVE:   SUBJECTIVE STATEMENT: Pt reports mild pain and was sore after last session. She is in good spirits after buying a new purse she enjoys and is ready for PT.   PERTINENT HISTORY: Osteopenia, anxiety, kidney-Rt removed (07/22/19) PAIN: 08/10/23 Are you having pain? Yes: NPRS scale: 0/10 Pain location: bil hips Pain description: sore/aching  Aggravating factors: standing and walking, sidelying in bed  Relieving factors: movement   PRECAUTIONS: Fall  RED FLAGS: None   WEIGHT BEARING RESTRICTIONS: No  FALLS:  Has patient fallen in last 6 months? No  LIVING ENVIRONMENT: Lives with: lives alone Lives in: House/apartment Stairs: No Has following equipment at home: None  OCCUPATION: retired   PLOF: Independent and Leisure:  I need to walk  PATIENT GOALS: reduce falls risk, start walking   NEXT MD VISIT: none   OBJECTIVE:  Note: Objective measures were completed at Evaluation unless otherwise noted.  DIAGNOSTIC FINDINGS: bone scan for osteoporosis    COGNITION: Overall cognitive status: Within functional limits for tasks assessed     SENSATION: [facility]  POSTURE: rounded shoulders and forward head  PALPATION: NA    LOWER EXTREMITY MMT:  Bil hips 4/5, knees 4+/5, ankles 4/5   FUNCTIONAL TESTS:  08/08/23  - 1148 ft RPE 5/10  08/02/23: 5 times sit to stand: 11.14 sec no UE support TUG: 6.34 sec no UE support with transfers and a strong pivot turn   Eval: 5 times sit to stand: 15.15 seconds  Timed up and go (TUG): 9.53 seconds  6 minute walk test: 1008 ft with RPE 6/10- age related norm 1164 feet   GAIT: Distance walked: 100 feet  Assistive device utilized: None Level of assistance: Complete  Independence Comments: mild staggering with fatigue, shortened step length                                                                                                                                 TREATMENT DATE:   08/10/23  NuStep level 4 for 6 min- PT monitored patient progress Heel raise at barre with ball between ankles to prevent lateral roll out 2x 10- verbal cues for alignment Standing for 30 sec on purple foam no UE support  Standing on purple foam with 2x 5 heads turn on both sides SLS step taps to 6 in step 2x10 on each leg Marches in sitting with 1 lb weight 2x20  Standing abduction at barre with 1 lbs on each ankle 2x10 SLS step taps to 6 in step, standing leg on purple foam pad 2x 10 each leg Discussed future sessions and progression  LAQ 4lb 2x10  Sit to stand 5lb with chest press x10     08/08/23 with RPE (see objective) heel raises x20 5# hand weights no UE support Sit to stand 5lb with chest press x10 Heel raise at barre with ball between ankles to prevent lateral roll out x 10 Sidestepping along counter yellow loop at ankles 4 laps cues to pick up feet  and keep tension on loop Alt march taps to 6 step 3lb ankle weights x20 SLS with foot on 2nd step 2x20 each cues to squeeze gluteals; then with head turns (added to HEP) LAQ 4lb 2x10  Seated up and overs B LE x 10, then with 3# wt to fatigue B Lateral and fwd hurdle walkovers 4 hurdles 4 laps each    08/05/23 NuStep L4 x 6' PT present to discuss status heel raises x20 5# hand weights no UE support SBA Heel raise at barre with ball between ankles to prevent lateral roll out x 10 Sidestepping along counter yellow loop at ankles 4 laps cues to pick up feet  and keep tension on loop Alt march taps to 6 step 3lb ankle weights x20 SLS with foot on 2nd step 2x20 each cues to squeeze gluteals LAQ 3lb 2x10 INCREASE WT NEXT TIME Pallof press standing 1x10 green band - used dowel vertically for easier  hold Standing on foam pad bil green rows  and ext x 15 ea On foam narrow BOS with head turns x 10 B Sit to stand 6lb x 10, then with with chest press x10 Standing up and over hurdle R only x 5 Seated up and overs R leg x 10      PATIENT EDUCATION:  Education details: Access Code: [email or web address] Person educated: Patient Education method: Explanation, Demonstration, and Handouts Education comprehension: verbalized understanding and returned demonstration  HOME EXERCISE PROGRAM: Access Code: [email or web address] URL: [email or web address] Date: 08/08/2023 Prepared by: Mliss  Exercises - Seated Long Arc Quad  - 3 x daily - 7 x weekly - 2 sets - 10 reps - 5 hold - Seated March   - 3 x daily - 7 x weekly - 3 sets - 10 reps - Seated Heel Toe Raises   - 3 x daily - 7 x weekly - 2 sets - 10 reps - Seated Hamstring Stretch  - 3 x daily - 7 x weekly - 1 sets - 3 reps - 20 hold - Seated Figure 4 Piriformis Stretch  - 1 x daily - 7 x weekly - 3 sets - 10 reps - Sit to Stand Without Arm Support  - 2 x daily - 7 x weekly - 1-2 sets - 10 reps - Standing Hip Abduction with Counter Support  - 1 x daily - 7 x weekly - 1-2 sets - 10 reps - Standing Hip Extension with Counter Support  - 1 x daily - 7 x weekly - 1-2 sets - 10 reps - Heel Raises with Counter Support  - 1 x daily - 7 x weekly - 2 sets - 10 reps - Forward Step Up  - 1 x daily - 7 x weekly - 2 sets - 10 reps  ASSESSMENT:  CLINICAL IMPRESSION:  Pt demonstrated significant improvement with maintaining balance during single limb stance while turning head both ways repeatedly. Pt has stated she feels PT has helped to improve her balance overall, however she still has days where she feels off balanced and would like to continue practicing balancing techniques. Pt required several verbal cues throughout exercises to maintain proper posture during movement.  SPT provided SBA to CGA for safety with balance tasks. Pt enjoys conversing with PT's and  needs instructions repeated throughout session. Pt would benefit from continued skilled PT to address the deficits below and decrease risk of falling.  OBJECTIVE IMPAIRMENTS: decreased activity tolerance, decreased endurance, difficulty walking, decreased strength, postural dysfunction, and pain.   ACTIVITY LIMITATIONS: standing, stairs, transfers, and locomotion level  PARTICIPATION LIMITATIONS: meal prep, cleaning, laundry, shopping, and community activity  PERSONAL FACTORS: Age, Time since onset of injury/illness/exacerbation, and 1 comorbidity: osteopenia are also affecting patient's functional outcome.   REHAB POTENTIAL: Good  CLINICAL DECISION MAKING: Stable/uncomplicated  EVALUATION COMPLEXITY: Low   GOALS: Goals reviewed with patient? Yes  SHORT TERM GOALS: Target date: 07/20/2023   Be independent in initial HEP Baseline: Goal status: in progress   2.  Report > or = to 50% less use of Ues with sit to stand transition due to improved leg strength  Baseline:  Goal status: MET 07/20/23  3.  Improve endurance to report < or 4/10 RPE with 6 min walk test  Baseline:  Goal status: ONGOING 07/26/23      LONG TERM GOALS: Target date: 08/17/2023    Be independent in advanced HEP Baseline:  Goal status: ONGOING  2.  Improve 6 min walk test to 1150 feet to improve community endurance  Baseline:  1008 feet  Goal status: ONGOING 08/08/23 1148 ft  3.  Report < or = 3/10 RPE with 6 min walk test due to improved endurance  Baseline:  Goal status: IN PROGRESS 08/08/23  4.  Perform 5x sit to stand in < or = to 12 seconds to reduce falls risk Baseline:  Goal status: MET 08/02/23  5.  Initiate a walking program and verbalize safe progression to improve community endurance  Baseline:  Goal status: INITIAL  6.  Report > or = to 50% improved confidence with balance in the community due to improved strength and stability  Baseline:  Goal status: ONGOING 7/15   PLAN:  PT  FREQUENCY: 2x/week  PT DURATION: 8 weeks  PLANNED INTERVENTIONS: 97110-Therapeutic exercises, 97530- Therapeutic activity, 97112- Neuromuscular re-education, 97535- Self Care, 02859- Manual therapy, [id number]- Gait training, [phone]- Canalith repositioning, [id number]- Aquatic Therapy, [phone] (1-2 muscles), [id number] (3+ muscles)- Dry Needling, Patient/Family education, Balance training, Stair training, Taping, Joint mobilization, Vestibular training, Cryotherapy, and Moist heat  PLAN FOR NEXT SESSION: continue split stance, narrow BOS, adding head turns with balance. Seated hip flexion strengthening, glute med strengthening, 10th visit next,  ABC Confidence test/short performance physical battery/  Lars as options to set new goals.  ERO and set new goals.    Lavanda Cleverly, SPT 08/10/23 4:40 PM  I agree with the following treatment note after reviewing documentation. This session was performed under the supervision of a licensed clinician.  Burnard Joy, PT 08/10/23 4:55 PM

## 2023-08-15 ENCOUNTER — Encounter: Payer: Self-pay | Admitting: Physical Therapy

## 2023-08-15 ENCOUNTER — Ambulatory Visit: Admitting: Physical Therapy

## 2023-08-15 DIAGNOSIS — M6281 Muscle weakness (generalized): Secondary | ICD-10-CM

## 2023-08-15 DIAGNOSIS — R2689 Other abnormalities of gait and mobility: Secondary | ICD-10-CM | POA: Diagnosis not present

## 2023-08-15 NOTE — Therapy (Signed)
 OUTPATIENT PHYSICAL THERAPY TREATMENT, REC-CERTIFICATION AND 10TH VISIT PROGRESS NOTE  Reporting Period 06/22/23 to 08/15/23   See note below for Objective Data and Assessment of Progress/Goals.    Patient Name: Summer Collins MRN: [id number] DOB:1940/01/29, 83 y.o., female Today's Date: 08/15/2023  END OF SESSION:  PT End of Session - 08/15/23 1402     Visit Number 10    Date for PT Re-Evaluation 09/28/23    Authorization Type Medicare B, Tricare (KX at 15)    Progress Note Due on Visit 20    PT Start Time 1402    PT Stop Time 1445    PT Time Calculation (min) 43 min    Activity Tolerance Patient tolerated treatment well    Behavior During Therapy WFL for tasks assessed/performed                   Past Medical History:  Diagnosis Date   Anxiety     on low dose zoloft  for a while   Arthritis    Cancer (HCC)    kidneys   Depression    Hyperlipidemia    Osteopenia    Vitamin D  deficiency    Past Surgical History:  Procedure Laterality Date   CARPAL TUNNEL RELEASE     DILATION AND CURETTAGE OF UTERUS     x 3   ROBOT ASSISTED LAPAROSCOPIC NEPHRECTOMY Right 07/20/2019   Procedure: XI ROBOTIC ASSISTED LAPAROSCOPIC RADICAL NEPHRECTOMY;  Surgeon: Alvaro Hummer, MD;  Location: [facility];  Service: Urology;  Laterality: Right;  3 HRS   Patient Active Problem List   Diagnosis Date Noted   Right renal mass 07/20/2019   Nasal congestion 02/04/2014   Estrogen deficiency 02/04/2014   Medication management 02/04/2014   Medicare annual wellness visit, subsequent 02/04/2014   Postural dizziness 11/30/2013   Knee swelling 05/09/2012   Adverse drug effect 05/09/2012   Ocular rosacea 12/15/2011   Hx of nonmelanoma skin cancer 12/15/2011   Nonspecific abnormal results of liver function study 10/31/2010   Screening for ischemic heart disease 10/31/2010   Preventative health care 10/31/2010   Hormone replacement therapy (postmenopausal) 08/22/2010   Hyperlipidemia     Osteopenia    Anxiety    Anxiety     PCP: Loreli Fallow, MD  REFERRING PROVIDER: Loreli Fallow, MD  REFERRING DIAG:  Diagnosis  R26.81 (ICD-10-CM) - Unsteadiness on feet    THERAPY DIAG:  Muscle weakness (generalized)  Other abnormalities of gait and mobility  Rationale for Evaluation and Treatment: Rehabilitation  ONSET DATE: chronic balance deficits-has been furniture walking  SUBJECTIVE:   SUBJECTIVE STATEMENT: I'm careful when I'm walking.  PERTINENT HISTORY: Osteopenia, anxiety, kidney-Rt removed (07/22/19) PAIN: 08/10/23 Are you having pain? Yes: NPRS scale: 0/10 Pain location: bil hips Pain description: sore/aching  Aggravating factors: standing and walking, sidelying in bed  Relieving factors: movement   PRECAUTIONS: Fall  RED FLAGS: None   WEIGHT BEARING RESTRICTIONS: No  FALLS:  Has patient fallen in last 6 months? No  LIVING ENVIRONMENT: Lives with: lives alone Lives in: House/apartment Stairs: No Has following equipment at home: None  OCCUPATION: retired   PLOF: Independent and Leisure:  I need to walk  PATIENT GOALS: reduce falls risk, start walking   NEXT MD VISIT: none   OBJECTIVE:  Note: Objective measures were completed at Evaluation unless otherwise noted.  DIAGNOSTIC FINDINGS: bone scan for osteoporosis    COGNITION: Overall cognitive status: Within functional limits for tasks assessed     SENSATION: [facility]  POSTURE: rounded shoulders and forward head  PALPATION: NA    LOWER EXTREMITY MMT:  Bil hips 4/5, knees 4+/5, ankles 4/5  08/15/23  R hip flex 4+/5, ABD 4/5, else 5/5   FUNCTIONAL TESTS:  08/15/23 DGI  22/24 ABC 1290 / 1600 = 80.6 %   BERG 51/56   08/08/23  - 1148 ft RPE 5/10  08/02/23: 5 times sit to stand: 11.14 sec no UE support TUG: 6.34 sec no UE support with transfers and a strong pivot turn   Eval: 5 times sit to stand: 15.15 seconds  Timed up and go (TUG): 9.53 seconds  6 minute  walk test: 1008 ft with RPE 6/10- age related norm 1164 feet   GAIT: Distance walked: 100 feet  Assistive device utilized: None Level of assistance: Complete Independence Comments: mild staggering with fatigue, shortened step length                                                                                                                                 TREATMENT DATE:  08/15/23  NuStep level 5 for 8 min- PT monitored patient progress DGI  22/24 ABC 1290 / 1600 = 80.6 %   BERG 51/56 SLS and tandem stance - multiple reps cues for glute engagment Discussed walking program at home - inside the house until weather cools  08/10/23  NuStep level 4 for 6 min- PT monitored patient progress Heel raise at barre with ball between ankles to prevent lateral roll out 2x 10- verbal cues for alignment Standing for 30 sec on purple foam no UE support  Standing on purple foam with 2x 5 heads turn on both sides SLS step taps to 6 in step 2x10 on each leg Marches in sitting with 1 lb weight 2x20  Standing abduction at barre with 1 lbs on each ankle 2x10 SLS step taps to 6 in step, standing leg on purple foam pad 2x 10 each leg Discussed future sessions and progression  LAQ 4lb 2x10  Sit to stand 5lb with chest press x10   08/08/23 with RPE (see objective) heel raises x20 5# hand weights no UE support Sit to stand 5lb with chest press x10 Heel raise at barre with ball between ankles to prevent lateral roll out x 10 Sidestepping along counter yellow loop at ankles 4 laps cues to pick up feet  and keep tension on loop Alt march taps to 6 step 3lb ankle weights x20 SLS with foot on 2nd step 2x20 each cues to squeeze gluteals; then with head turns (added to HEP) LAQ 4lb 2x10  Seated up and overs B LE x 10, then with 3# wt to fatigue B Lateral and fwd hurdle walkovers 4 hurdles 4 laps each      PATIENT EDUCATION:  Education details: Access Code: [email or web address] Person educated:  Patient Education method: Explanation, Demonstration, and Handouts Education comprehension: verbalized understanding and returned demonstration  HOME EXERCISE  PROGRAM: Access Code: [email or web address] URL: [email or web address] Date: 08/15/2023 Prepared by: Mliss  Exercises - Seated Long Arc Quad  - 3 x daily - 7 x weekly - 2 sets - 10 reps - 5 hold - Seated March   - 3 x daily - 7 x weekly - 3 sets - 10 reps - Seated Heel Toe Raises   - 3 x daily - 7 x weekly - 2 sets - 10 reps - Seated Hamstring Stretch  - 3 x daily - 7 x weekly - 1 sets - 3 reps - 20 hold - Seated Figure 4 Piriformis Stretch  - 1 x daily - 7 x weekly - 3 sets - 10 reps - Sit to Stand Without Arm Support  - 2 x daily - 7 x weekly - 1-2 sets - 10 reps - Standing Hip Abduction with Counter Support  - 1 x daily - 7 x weekly - 1-2 sets - 10 reps - Standing Hip Extension with Counter Support  - 1 x daily - 7 x weekly - 1-2 sets - 10 reps - Heel Raises with Counter Support  - 1 x daily - 7 x weekly - 2 sets - 10 reps - Forward Step Up  - 1 x daily - 7 x weekly - 2 sets - 10 reps - Tandem Stance with Support  - 1-2 x daily - 7 x weekly - 1 sets - 5 reps - max hold - Standing Single Leg Stance with Counter Support  - 1-2 x daily - 7 x weekly - 1 sets - 5 reps - max hold  Patient Education - walking program   ASSESSMENT:  CLINICAL IMPRESSION:  Patient is progressing with her LTG meeting her balance confidence goal and 5xSTS goal. Her concern with gait is endurance. She has almost met her goal (missed by 2 ft), however her RPE is still 5/10. She has not initiated a walking program, so we discussed this and came up with a plan for the patient today. She states she feels PT has helped to improve her balance overall, however she still has days where she feels off balanced and would like to continue practicing balancing techniques. She scored 51/56 on the BERG indicating she is a moderate fall risk. Looking up affects her  balance when walking.  Pt would benefit from continued skilled PT to address the deficits below and decrease risk of falling.   OBJECTIVE IMPAIRMENTS: decreased activity tolerance, decreased balance, decreased endurance, difficulty walking, decreased strength, postural dysfunction, and pain.   ACTIVITY LIMITATIONS: standing, stairs, transfers, and locomotion level  PARTICIPATION LIMITATIONS: meal prep, cleaning, laundry, shopping, and community activity  PERSONAL FACTORS: Age, Time since onset of injury/illness/exacerbation, and 1 comorbidity: osteopenia are also affecting patient's functional outcome.   REHAB POTENTIAL: Good  CLINICAL DECISION MAKING: Stable/uncomplicated  EVALUATION COMPLEXITY: Low   GOALS: Goals reviewed with patient? Yes  SHORT TERM GOALS: Target date: 07/20/2023   Be independent in initial HEP Baseline: Goal status: in progress   2.  Report > or = to 50% less use of Ues with sit to stand transition due to improved leg strength  Baseline:  Goal status: MET 07/20/23  3.  Improve endurance to report < or 4/10 RPE with 6 min walk test  Baseline:  Goal status: ONGOING 07/26/23      LONG TERM GOALS: Target date: 09/28/23    Be independent in advanced HEP Baseline:  Goal status: ONGOING 08/15/23  2.  Improve 6 min walk  test to 1150 feet to improve community endurance  Baseline: 1008 feet  Goal status: ONGOING 08/08/23 1148 ft  3.  Report < or = 3/10 RPE with 6 min walk test due to improved endurance  Baseline:  Goal status: IN PROGRESS 08/08/23  4.  Perform 5x sit to stand in < or = to 12 seconds to reduce falls risk Baseline:  Goal status: MET 08/02/23  5.  Initiate a walking program and verbalize safe progression to improve community endurance  Baseline:  Goal status: IN PROGRESS 08/15/23  6.  Report > or = to 50% improved confidence with balance in the community due to improved strength and stability  Baseline:  Goal status: MET 08/15/23 - more  concerned with endurance  7.  Improved BERG score to >=53/56 demonstrating improved higher level balance Baseline:  Goal status: INITIAL    PLAN:  PT FREQUENCY: 2x/week  PT DURATION: 8 weeks  PLANNED INTERVENTIONS: 97110-Therapeutic exercises, 97530- Therapeutic activity, [id number]- Neuromuscular re-education, 97535- Self Care, 02859- Manual therapy, [id number]- Gait training, [phone]- Canalith repositioning, [id number]- Aquatic Therapy, [phone] (1-2 muscles), 20561 (3+ muscles)- Dry Needling, Patient/Family education, Balance training, Stair training, Taping, Joint mobilization, Vestibular training, Cryotherapy, and Moist heat  PLAN FOR NEXT SESSION: continue split stance, narrow BOS, adding head turns with balance. Seated hip flexion strengthening, glute med strengthening, SLS and tandem stance    Mliss Cummins, PT 08/15/23 5:27 PM

## 2023-08-18 ENCOUNTER — Ambulatory Visit

## 2023-08-18 DIAGNOSIS — M6281 Muscle weakness (generalized): Secondary | ICD-10-CM

## 2023-08-18 DIAGNOSIS — R2689 Other abnormalities of gait and mobility: Secondary | ICD-10-CM

## 2023-08-18 NOTE — Therapy (Signed)
 OUTPATIENT PHYSICAL THERAPY TREATMENT, REC-CERTIFICATION AND 10TH VISIT   Patient Name: Summer Collins MRN: [id number] DOB:28-May-1940, 83 y.o., female Today's Date: 08/18/2023  END OF SESSION:  PT End of Session - 08/18/23 1231     Visit Number 11    Date for PT Re-Evaluation 09/28/23    Authorization Type Medicare B, T[employer]are (KX at 15)    Progress Note Due on Visit 20    PT Start Time 1147    PT Stop Time 1232    PT Time Calculation (min) 45 min    Activity Tolerance Patient tolerated treatment well    Behavior During Therapy WFL for tasks assessed/performed                    Past Medical History:  Diagnosis Date   Anxiety     on low dose zoloft  for a while   Arthritis    Cancer (HCC)    kidneys   Depression    Hyperlipidemia    Osteopenia    Vitamin D  deficiency    Past Surgical History:  Procedure Laterality Date   CARPAL TUNNEL RELEASE     DILATION AND CURETTAGE OF UTERUS     x 3   ROBOT ASSISTED LAPAROSCOPIC NEPHRECTOMY Right 07/20/2019   Procedure: XI ROBOTIC ASSISTED LAPAROSCOPIC RADICAL NEPHRECTOMY;  Surgeon: Alvaro Hummer, MD;  Location: [facility];  Service: Urology;  Laterality: Right;  3 HRS   Patient Active Problem List   Diagnosis Date Noted   Right renal mass 07/20/2019   Nasal congestion 02/04/2014   Estrogen deficiency 02/04/2014   Medication management 02/04/2014   Medicare annual wellness visit, subsequent 02/04/2014   Postural dizziness 11/30/2013   Knee swelling 05/09/2012   Adverse drug effect 05/09/2012   Ocular rosacea 12/15/2011   Hx of nonmelanoma skin cancer 12/15/2011   Nonspecific abnormal results of liver function study 10/31/2010   Screening for ischemic heart disease 10/31/2010   Preventative health care 10/31/2010   Hormone replacement therapy (postmenopausal) 08/22/2010   Hyperlipidemia    Osteopenia    Anxiety    Anxiety     PCP: Loreli Fallow, MD  REFERRING PROVIDER: Loreli Fallow, MD  REFERRING DIAG:   Diagnosis  R26.81 (ICD-10-CM) - Unsteadiness on feet    THERAPY DIAG:  Muscle weakness (generalized)  Other abnormalities of gait and mobility  Rationale for Evaluation and Treatment: Rehabilitation  ONSET DATE: chronic balance deficits-has been furniture walking  SUBJECTIVE:   SUBJECTIVE STATEMENT: I'm careful when I'm walking.  PERTINENT HISTORY: Osteopenia, anxiety, kidney-Rt removed (07/22/19) PAIN: 08/10/23 Are you having pain? Yes: NPRS scale: 0/10 Pain location: bil hips Pain description: sore/aching  Aggravating factors: standing and walking, sidelying in bed  Relieving factors: movement   PRECAUTIONS: Fall  RED FLAGS: None   WEIGHT BEARING RESTRICTIONS: No  FALLS:  Has patient fallen in last 6 months? No  LIVING ENVIRONMENT: Lives with: lives alone Lives in: House/apartment Stairs: No Has following equipment at home: None  OCCUPATION: retired   PLOF: Independent and Leisure:  I need to walk  PATIENT GOALS: reduce falls risk, start walking   NEXT MD VISIT: none   OBJECTIVE:  Note: Objective measures were completed at Evaluation unless otherwise noted.  DIAGNOSTIC FINDINGS: bone scan for osteoporosis    COGNITION: Overall cognitive status: Within functional limits for tasks assessed     SENSATION: WFL    POSTURE: rounded shoulders and forward head  PALPATION: NA    LOWER EXTREMITY MMT:  Bil  hips 4/5, knees 4+/5, ankles 4/5  08/15/23  R hip flex 4+/5, ABD 4/5, else 5/5   FUNCTIONAL TESTS:  08/15/23 DGI  22/24 ABC 1290 / 1600 = 80.6 %   BERG 51/56   08/08/23  - 1148 ft RPE 5/10  08/02/23: 5 times sit to stand: 11.14 sec no UE support TUG: 6.34 sec no UE support with transfers and a strong pivot turn   Eval: 5 times sit to stand: 15.15 seconds  Timed up and go (TUG): 9.53 seconds  6 minute walk test: 1008 ft with RPE 6/10- age related norm 1164 feet   GAIT: Distance walked: 100 feet  Assistive device utilized:  None Level of assistance: Complete Independence Comments: mild staggering with fatigue, shortened step length                                                                                                                                 TREATMENT DATE:  08/18/23  NuStep level 5 for 8 min- PT monitored patient progress Heel raise at barre standing on balance pad 2x10 Standing for 30 sec on purple foam no UE support  Standing on purple foam with 2x 5 heads turn on both sides Modified tandem stance 2x20 seconds Rt and Lt without UE support Feet together with head turns and head up/down x30 seconds each  Marches in sitting with 1 lb weight 2x20  Standing abduction and extension at barre: standing on foam pad 2x10 SLS step taps to 6 in step, standing leg on purple foam pad 2x 10 each leg Sit to stand 5lb with chest press 2x10   08/15/23 NuStep level 5 for 8 min- PT monitored patient progress DGI  22/24 ABC 1290 / 1600 = 80.6 %   BERG 51/56 SLS and tandem stance - multiple reps cues for glute engagment Discussed walking program at home - inside the house until weather cools  08/10/23  NuStep level 4 for 6 min- PT monitored patient progress Heel raise at barre with ball between ankles to prevent lateral roll out 2x 10- verbal cues for alignment Standing for 30 sec on purple foam no UE support  Standing on purple foam with 2x 5 heads turn on both sides SLS step taps to 6 in step 2x10 on each leg Marches in sitting with 1 lb weight 2x20  Standing abduction at barre with 1 lbs on each ankle 2x10 SLS step taps to 6 in step, standing leg on purple foam pad 2x 10 each leg Discussed future sessions and progression  LAQ 4lb 2x10  Sit to stand 5lb with chest press x10  PATIENT EDUCATION:  Education details: Access Code: [email or web address] Person educated: Patient Education method: Explanation, Demonstration, and Handouts Education comprehension: verbalized understanding and returned  demonstration  HOME EXERCISE PROGRAM: Access Code: [email or web address] URL: [email or web address] Date: 08/15/2023 Prepared by: Mliss  Exercises - Seated Long Arc Quad  - 3 x daily - 7 x  weekly - 2 sets - 10 reps - 5 hold - Seated March   - 3 x daily - 7 x weekly - 3 sets - 10 reps - Seated Heel Toe Raises   - 3 x daily - 7 x weekly - 2 sets - 10 reps - Seated Hamstring Stretch  - 3 x daily - 7 x weekly - 1 sets - 3 reps - 20 hold - Seated Figure 4 Piriformis Stretch  - 1 x daily - 7 x weekly - 3 sets - 10 reps - Sit to Stand Without Arm Support  - 2 x daily - 7 x weekly - 1-2 sets - 10 reps - Standing Hip Abduction with Counter Support  - 1 x daily - 7 x weekly - 1-2 sets - 10 reps - Standing Hip Extension with Counter Support  - 1 x daily - 7 x weekly - 1-2 sets - 10 reps - Heel Raises with Counter Support  - 1 x daily - 7 x weekly - 2 sets - 10 reps - Forward Step Up  - 1 x daily - 7 x weekly - 2 sets - 10 reps - Tandem Stance with Support  - 1-2 x daily - 7 x weekly - 1 sets - 5 reps - max hold - Standing Single Leg Stance with Counter Support  - 1-2 x daily - 7 x weekly - 1 sets - 5 reps - max hold  Patient Education - walking program   ASSESSMENT:  CLINICAL IMPRESSION:  Pt is making good progress and is working on balance tasks to improve her confidence at home and in the community and reduce her falls risk. She states she feels PT has helped to improve her balance overall, however she still has days where she feels off balanced and would like to continue practicing balancing techniques. She scored 51/56 on the BERG indicating she is a moderate fall risk this week.  Looking up affects her balance when walking.  Pt would benefit from continued skilled PT to address the deficits below and decrease risk of falling.   OBJECTIVE IMPAIRMENTS: decreased activity tolerance, decreased balance, decreased endurance, difficulty walking, decreased strength, postural dysfunction, and pain.    ACTIVITY LIMITATIONS: standing, stairs, transfers, and locomotion level  PARTICIPATION LIMITATIONS: meal prep, cleaning, laundry, shopping, and community activity  PERSONAL FACTORS: Age, Time since onset of injury/illness/exacerbation, and 1 comorbidity: osteopenia are also affecting patient's functional outcome.   REHAB POTENTIAL: Good  CLINICAL DECISION MAKING: Stable/uncomplicated  EVALUATION COMPLEXITY: Low   GOALS: Goals reviewed with patient? Yes  SHORT TERM GOALS: Target date: 07/20/2023   Be independent in initial HEP Baseline: Goal status: in progress   2.  Report > or = to 50% less use of Ues with sit to stand transition due to improved leg strength  Baseline:  Goal status: MET 07/20/23  3.  Improve endurance to report < or 4/10 RPE with 6 min walk test  Baseline:  Goal status: ONGOING 07/26/23      LONG TERM GOALS: Target date: 09/28/23    Be independent in advanced HEP Baseline:  Goal status: ONGOING 08/15/23  2.  Improve 6 min walk test to 1150 feet to improve community endurance  Baseline: 1008 feet  Goal status: ONGOING 08/08/23 1148 ft  3.  Report < or = 3/10 RPE with 6 min walk test due to improved endurance  Baseline:  Goal status: IN PROGRESS 08/08/23  4.  Perform 5x sit to stand in <  or = to 12 seconds to reduce falls risk Baseline:  Goal status: MET 08/02/23  5.  Initiate a walking program and verbalize safe progression to improve community endurance  Baseline:  Goal status: IN PROGRESS 08/15/23  6.  Report > or = to 50% improved confidence with balance in the community due to improved strength and stability  Baseline:  Goal status: MET 08/15/23 - more concerned with endurance  7.  Improved BERG score to >=53/56 demonstrating improved higher level balance Baseline:  Goal status: INITIAL    PLAN:  PT FREQUENCY: 2x/week  PT DURATION: 8 weeks  PLANNED INTERVENTIONS: 97110-Therapeutic exercises, 97530- Therapeutic activity, [id number]-  Neuromuscular re-education, 97535- Self Care, 02859- Manual therapy, [id number]- Gait training, [phone]- Canalith repositioning, [id number]- Aquatic Therapy, [phone] (1-2 muscles), [id number] (3+ muscles)- Dry Needling, Patient/Family education, Balance training, Stair training, Taping, Joint mobilization, Vestibular training, Cryotherapy, and Moist heat  PLAN FOR NEXT SESSION: continue split stance, narrow BOS, adding head turns with balance. Seated hip flexion strengthening, glute med strengthening, SLS and tandem stance    Burnard Joy, PT 08/18/23 12:37 PM

## 2023-09-05 NOTE — Therapy (Signed)
 OUTPATIENT PHYSICAL THERAPY TREATMENT TREATMENT   Patient Name: Summer Collins MRN: [id number] DOB:31-Dec-1940, 83 y.o., female Today's Date: 09/06/2023  END OF SESSION:  PT End of Session - 09/06/23 1401     Visit Number 12    Date for PT Re-Evaluation 09/28/23    Authorization Type Medicare B, Tricare (KX at 15)    Progress Note Due on Visit 20    PT Start Time 1401    PT Stop Time 1444    PT Time Calculation (min) 43 min    Activity Tolerance Patient tolerated treatment well    Behavior During Therapy WFL for tasks assessed/performed                     Past Medical History:  Diagnosis Date   Anxiety     on low dose zoloft  for a while   Arthritis    Cancer (HCC)    kidneys   Depression    Hyperlipidemia    Osteopenia    Vitamin D  deficiency    Past Surgical History:  Procedure Laterality Date   CARPAL TUNNEL RELEASE     DILATION AND CURETTAGE OF UTERUS     x 3   ROBOT ASSISTED LAPAROSCOPIC NEPHRECTOMY Right 07/20/2019   Procedure: XI ROBOTIC ASSISTED LAPAROSCOPIC RADICAL NEPHRECTOMY;  Surgeon: Alvaro Hummer, MD;  Location: [facility];  Service: Urology;  Laterality: Right;  3 HRS   Patient Active Problem List   Diagnosis Date Noted   Right renal mass 07/20/2019   Nasal congestion 02/04/2014   Estrogen deficiency 02/04/2014   Medication management 02/04/2014   Medicare annual wellness visit, subsequent 02/04/2014   Postural dizziness 11/30/2013   Knee swelling 05/09/2012   Adverse drug effect 05/09/2012   Ocular rosacea 12/15/2011   Hx of nonmelanoma skin cancer 12/15/2011   Nonspecific abnormal results of liver function study 10/31/2010   Screening for ischemic heart disease 10/31/2010   Preventative health care 10/31/2010   Hormone replacement therapy (postmenopausal) 08/22/2010   Hyperlipidemia    Osteopenia    Anxiety    Anxiety     PCP: Loreli Fallow, MD  REFERRING PROVIDER: Loreli Fallow, MD  REFERRING DIAG:  Diagnosis  R26.81  (ICD-10-CM) - Unsteadiness on feet    THERAPY DIAG:  Muscle weakness (generalized)  Other abnormalities of gait and mobility  Rationale for Evaluation and Treatment: Rehabilitation  ONSET DATE: chronic balance deficits-has been furniture walking  SUBJECTIVE:   SUBJECTIVE STATEMENT: I feel like I'm back sliding a little because I haven't been doing my exercises. I wore soft sandals yesterday and my balance was bad. My balance is always worse first thing in the morning.   PERTINENT HISTORY: Osteopenia, anxiety, kidney-Rt removed (07/22/19) PAIN: 08/10/23 Are you having pain? Yes: NPRS scale: 0/10 Pain location: bil hips Pain description: sore/aching  Aggravating factors: standing and walking, sidelying in bed  Relieving factors: movement   PRECAUTIONS: Fall  RED FLAGS: None   WEIGHT BEARING RESTRICTIONS: No  FALLS:  Has patient fallen in last 6 months? No  LIVING ENVIRONMENT: Lives with: lives alone Lives in: House/apartment Stairs: No Has following equipment at home: None  OCCUPATION: retired   PLOF: Independent and Leisure:  I need to walk  PATIENT GOALS: reduce falls risk, start walking   NEXT MD VISIT: none   OBJECTIVE:  Note: Objective measures were completed at Evaluation unless otherwise noted.  DIAGNOSTIC FINDINGS: bone scan for osteoporosis    COGNITION: Overall cognitive status: Within functional limits  for tasks assessed     SENSATION: WFL    POSTURE: rounded shoulders and forward head  PALPATION: NA    LOWER EXTREMITY MMT:  Bil hips 4/5, knees 4+/5, ankles 4/5  08/15/23  R hip flex 4+/5, ABD 4/5, else 5/5   FUNCTIONAL TESTS:  08/15/23 DGI  22/24 ABC 1290 / 1600 = 80.6 %   BERG 51/56   08/08/23  - 1148 ft RPE 5/10  08/02/23: 5 times sit to stand: 11.14 sec no UE support TUG: 6.34 sec no UE support with transfers and a strong pivot turn   Eval: 5 times sit to stand: 15.15 seconds  Timed up and go (TUG): 9.53  seconds  6 minute walk test: 1008 ft with RPE 6/10- age related norm 1164 feet   GAIT: Distance walked: 100 feet  Assistive device utilized: None Level of assistance: Complete Independence Comments: mild staggering with fatigue, shortened step length                                                                                                                                 TREATMENT DATE:  09/06/23 NuStep level 5 for 8 min- PT monitored patient progress Heel raise at barre standing on balance pad 2x10 Standing  narrow BOS for 30 sec on purple foam no UE support  Walking on foam - cues to raise R foot higher Standing on purple foam with 10 heads turns and nods Modified tandem stance 2x20 seconds Rt and Lt without UE support and then with head turns  SLS taps to 1st and 2nd steps 2x 20 B Standing abduction and extension at barre: standing on foam pad 2x10 SLS step taps to 6 in step, standing leg on purple foam pad 2x 10 each leg Sit to stand 5lb with chest press 2x10 (do from hi/lo table to challenge pt with lower surface.    08/18/23 NuStep level 5 for 8 min- PT monitored patient progress Heel raise at barre standing on balance pad 2x10 Standing for 30 sec on purple foam no UE support  Standing on purple foam with 2x 5 heads turn on both sides Modified tandem stance 2x20 seconds Rt and Lt without UE support Feet together with head turns and head up/down x30 seconds each  Marches in sitting with 1 lb weight 2x20  Standing abduction and extension at barre: standing on foam pad 2x10 SLS step taps to 6 in step, standing leg on purple foam pad 2x 10 each leg Sit to stand 5lb with chest press 2x10   08/15/23 NuStep level 5 for 8 min- PT monitored patient progress DGI  22/24 ABC 1290 / 1600 = 80.6 %   BERG 51/56 SLS and tandem stance - multiple reps cues for glute engagment Discussed walking program at home - inside the house until weather cools  08/10/23  NuStep level 4 for 6  min- PT monitored patient progress Heel raise at barre  with ball between ankles to prevent lateral roll out 2x 10- verbal cues for alignment Standing for 30 sec on purple foam no UE support  Standing on purple foam with 2x 5 heads turn on both sides SLS step taps to 6 in step 2x10 on each leg Marches in sitting with 1 lb weight 2x20  Standing abduction at barre with 1 lbs on each ankle 2x10 SLS step taps to 6 in step, standing leg on purple foam pad 2x 10 each leg Discussed future sessions and progression  LAQ 4lb 2x10  Sit to stand 5lb with chest press x10  PATIENT EDUCATION:  Education details: Access Code: [email or web address] Person educated: Patient Education method: Explanation, Demonstration, and Handouts Education comprehension: verbalized understanding and returned demonstration  HOME EXERCISE PROGRAM: Access Code: [email or web address] URL: [email or web address] Date: 08/15/2023 Prepared by: Mliss  Exercises - Seated Long Arc Quad  - 3 x daily - 7 x weekly - 2 sets - 10 reps - 5 hold - Seated March   - 3 x daily - 7 x weekly - 3 sets - 10 reps - Seated Heel Toe Raises   - 3 x daily - 7 x weekly - 2 sets - 10 reps - Seated Hamstring Stretch  - 3 x daily - 7 x weekly - 1 sets - 3 reps - 20 hold - Seated Figure 4 Piriformis Stretch  - 1 x daily - 7 x weekly - 3 sets - 10 reps - Sit to Stand Without Arm Support  - 2 x daily - 7 x weekly - 1-2 sets - 10 reps - Standing Hip Abduction with Counter Support  - 1 x daily - 7 x weekly - 1-2 sets - 10 reps - Standing Hip Extension with Counter Support  - 1 x daily - 7 x weekly - 1-2 sets - 10 reps - Heel Raises with Counter Support  - 1 x daily - 7 x weekly - 2 sets - 10 reps - Forward Step Up  - 1 x daily - 7 x weekly - 2 sets - 10 reps - Tandem Stance with Support  - 1-2 x daily - 7 x weekly - 1 sets - 5 reps - max hold - Standing Single Leg Stance with Counter Support  - 1-2 x daily - 7 x weekly - 1 sets - 5 reps - max hold  Patient  Education - walking program   ASSESSMENT:  CLINICAL IMPRESSION:   Patient returns to PT after a two week break due to scheduling. She feels her balance is worse, but she actually did well with her exercises today and with many of the balance exercises she was challenged at first but then improved with repetitions. She continues to have marked weakness in her L glute medius. She continues to demonstrate potential for improvement and would benefit from continued skilled therapy to address impairments.    OBJECTIVE IMPAIRMENTS: decreased activity tolerance, decreased balance, decreased endurance, difficulty walking, decreased strength, postural dysfunction, and pain.   ACTIVITY LIMITATIONS: standing, stairs, transfers, and locomotion level  PARTICIPATION LIMITATIONS: meal prep, cleaning, laundry, shopping, and community activity  PERSONAL FACTORS: Age, Time since onset of injury/illness/exacerbation, and 1 comorbidity: osteopenia are also affecting patient's functional outcome.   REHAB POTENTIAL: Good  CLINICAL DECISION MAKING: Stable/uncomplicated  EVALUATION COMPLEXITY: Low   GOALS: Goals reviewed with patient? Yes  SHORT TERM GOALS: Target date: 07/20/2023   Be independent in initial HEP Baseline: Goal status: in progress   2.  Report >  or = to 50% less use of Ues with sit to stand transition due to improved leg strength  Baseline:  Goal status: MET 07/20/23  3.  Improve endurance to report < or 4/10 RPE with 6 min walk test  Baseline:  Goal status: ONGOING 07/26/23      LONG TERM GOALS: Target date: 09/28/23    Be independent in advanced HEP Baseline:  Goal status: ONGOING 08/15/23  2.  Improve 6 min walk test to 1150 feet to improve community endurance  Baseline: 1008 feet  Goal status: ONGOING 08/08/23 1148 ft  3.  Report < or = 3/10 RPE with 6 min walk test due to improved endurance  Baseline:  Goal status: IN PROGRESS 08/08/23  4.  Perform 5x sit to stand in <  or = to 12 seconds to reduce falls risk Baseline:  Goal status: MET 08/02/23  5.  Initiate a walking program and verbalize safe progression to improve community endurance  Baseline:  Goal status: IN PROGRESS 08/15/23  6.  Report > or = to 50% improved confidence with balance in the community due to improved strength and stability  Baseline:  Goal status: MET 08/15/23 - more concerned with endurance  7.  Improved BERG score to >=53/56 demonstrating improved higher level balance Baseline:  Goal status: INITIAL    PLAN:  PT FREQUENCY: 2x/week  PT DURATION: 8 weeks  PLANNED INTERVENTIONS: 97110-Therapeutic exercises, 97530- Therapeutic activity, [id number]- Neuromuscular re-education, 97535- Self Care, 02859- Manual therapy, [id number]- Gait training, [phone]- Canalith repositioning, [id number]- Aquatic Therapy, [phone] (1-2 muscles), [id number] (3+ muscles)- Dry Needling, Patient/Family education, Balance training, Stair training, Taping, Joint mobilization, Vestibular training, Cryotherapy, and Moist heat  PLAN FOR NEXT SESSION: continue split stance, narrow BOS, adding head turns with balance. Seated hip flexion strengthening, glute med strengthening, SLS and tandem stance    Mliss Cummins, PT  09/06/23 4:00 PM

## 2023-09-06 ENCOUNTER — Ambulatory Visit: Attending: Internal Medicine | Admitting: Physical Therapy

## 2023-09-06 ENCOUNTER — Encounter: Payer: Self-pay | Admitting: Physical Therapy

## 2023-09-06 DIAGNOSIS — R2689 Other abnormalities of gait and mobility: Secondary | ICD-10-CM | POA: Diagnosis not present

## 2023-09-06 DIAGNOSIS — M6281 Muscle weakness (generalized): Secondary | ICD-10-CM | POA: Insufficient documentation

## 2023-09-08 ENCOUNTER — Encounter: Payer: Self-pay | Admitting: Physical Therapy

## 2023-09-08 ENCOUNTER — Ambulatory Visit: Admitting: Physical Therapy

## 2023-09-08 DIAGNOSIS — M6281 Muscle weakness (generalized): Secondary | ICD-10-CM | POA: Diagnosis not present

## 2023-09-08 DIAGNOSIS — R2689 Other abnormalities of gait and mobility: Secondary | ICD-10-CM | POA: Diagnosis not present

## 2023-09-08 NOTE — Therapy (Signed)
 OUTPATIENT PHYSICAL THERAPY TREATMENT TREATMENT   Patient Name: Summer Collins MRN: [id number] DOB:1940/09/14, 83 y.o., female Today's Date: 09/08/2023  END OF SESSION:  PT End of Session - 09/08/23 1621     Visit Number 13    Date for PT Re-Evaluation 09/28/23    Authorization Type Medicare B, T[employer]are (KX at 15)    Progress Note Due on Visit 20    PT Start Time 1531    PT Stop Time 1615    PT Time Calculation (min) 44 min    Activity Tolerance Patient tolerated treatment well    Behavior During Therapy WFL for tasks assessed/performed                      Past Medical History:  Diagnosis Date   Anxiety     on low dose zoloft  for a while   Arthritis    Cancer (HCC)    kidneys   Depression    Hyperlipidemia    Osteopenia    Vitamin D  deficiency    Past Surgical History:  Procedure Laterality Date   CARPAL TUNNEL RELEASE     DILATION AND CURETTAGE OF UTERUS     x 3   ROBOT ASSISTED LAPAROSCOPIC NEPHRECTOMY Right 07/20/2019   Procedure: XI ROBOTIC ASSISTED LAPAROSCOPIC RADICAL NEPHRECTOMY;  Surgeon: Alvaro Hummer, MD;  Location: [facility];  Service: Urology;  Laterality: Right;  3 HRS   Patient Active Problem List   Diagnosis Date Noted   Right renal mass 07/20/2019   Nasal congestion 02/04/2014   Estrogen deficiency 02/04/2014   Medication management 02/04/2014   Medicare annual wellness visit, subsequent 02/04/2014   Postural dizziness 11/30/2013   Knee swelling 05/09/2012   Adverse drug effect 05/09/2012   Ocular rosacea 12/15/2011   Hx of nonmelanoma skin cancer 12/15/2011   Nonspecific abnormal results of liver function study 10/31/2010   Screening for ischemic heart disease 10/31/2010   Preventative health care 10/31/2010   Hormone replacement therapy (postmenopausal) 08/22/2010   Hyperlipidemia    Osteopenia    Anxiety    Anxiety     PCP: Loreli Fallow, MD  REFERRING PROVIDER: Loreli Fallow, MD  REFERRING DIAG:  Diagnosis  R26.81  (ICD-10-CM) - Unsteadiness on feet    THERAPY DIAG:  Muscle weakness (generalized)  Other abnormalities of gait and mobility  Rationale for Evaluation and Treatment: Rehabilitation  ONSET DATE: chronic balance deficits-has been furniture walking  SUBJECTIVE:   SUBJECTIVE STATEMENT: Patient reports she is doing good today. No new complaints  PERTINENT HISTORY: Osteopenia, anxiety, kidney-Rt removed (07/22/19)  PAIN: 08/10/23 Are you having pain? Yes: NPRS scale: 0/10 Pain location: bil hips Pain description: sore/aching  Aggravating factors: standing and walking, sidelying in bed  Relieving factors: movement   PRECAUTIONS: Fall  RED FLAGS: None   WEIGHT BEARING RESTRICTIONS: No  FALLS:  Has patient fallen in last 6 months? No  LIVING ENVIRONMENT: Lives with: lives alone Lives in: House/apartment Stairs: No Has following equipment at home: None  OCCUPATION: retired   PLOF: Independent and Leisure:  I need to walk  PATIENT GOALS: reduce falls risk, start walking   NEXT MD VISIT: none   OBJECTIVE:  Note: Objective measures were completed at Evaluation unless otherwise noted.  DIAGNOSTIC FINDINGS: bone scan for osteoporosis    COGNITION: Overall cognitive status: Within functional limits for tasks assessed     SENSATION: WFL    POSTURE: rounded shoulders and forward head  PALPATION: NA  LOWER EXTREMITY MMT:  Bil hips 4/5, knees 4+/5, ankles 4/5  08/15/23  R hip flex 4+/5, ABD 4/5, else 5/5   FUNCTIONAL TESTS:  08/15/23 DGI  22/24 ABC 1290 / 1600 = 80.6 %   BERG 51/56   08/08/23  - 1148 ft RPE 5/10  08/02/23: 5 times sit to stand: 11.14 sec no UE support TUG: 6.34 sec no UE support with transfers and a strong pivot turn   Eval: 5 times sit to stand: 15.15 seconds  Timed up and go (TUG): 9.53 seconds  6 minute walk test: 1008 ft with RPE 6/10- age related norm 1164 feet   GAIT: Distance walked: 100 feet  Assistive device  utilized: None Level of assistance: Complete Independence Comments: mild staggering with fatigue, shortened step length                                                                                                                                 TREATMENT DATE:  09/08/23 NuStep level 4 for 8 min- PT monitored patient progress Heel raise at barre standing on balance pad 2x10 Single leg on thera pad + opposite leg swing (forwards, lateral, backwards) x 8 bilateral unilateral UE at barre 6 inch step ups holding 5# DB x 10 bilateral  Tandem walking at barre (forwards & backwards) x 3 laps with UE support then one lap with no UE support Standing hip abduction and extension at barre: standing on foam pad 2x10 Standing on purple foam with 10 heads turns and nods (did not required UE support) Agility Ladder( high march, sideways, in/out) x 1 lap each Shoulder extension with red TB + march x 20 total  5# Unilateral farmer's carry x 1 lap around gym    09/06/23 NuStep level 5 for 8 min- PT monitored patient progress Heel raise at barre standing on balance pad 2x10 Standing  narrow BOS for 30 sec on purple foam no UE support  Walking on foam - cues to raise R foot higher Standing on purple foam with 10 heads turns and nods Modified tandem stance 2x20 seconds Rt and Lt without UE support and then with head turns  SLS taps to 1st and 2nd steps 2x 20 B Standing abduction and extension at barre: standing on foam pad 2x10 SLS step taps to 6 in step, standing leg on purple foam pad 2x 10 each leg Sit to stand 5lb with chest press 2x10 (do from hi/lo table to challenge pt with lower surface.    08/18/23 NuStep level 5 for 8 min- PT monitored patient progress Heel raise at barre standing on balance pad 2x10 Standing for 30 sec on purple foam no UE support  Standing on purple foam with 2x 5 heads turn on both sides Modified tandem stance 2x20 seconds Rt and Lt without UE support Feet together with  head turns and head up/down x30 seconds each  Marches in sitting with 1 lb  weight 2x20  Standing abduction and extension at barre: standing on foam pad 2x10 SLS step taps to 6 in step, standing leg on purple foam pad 2x 10 each leg Sit to stand 5lb with chest press 2x10   08/15/23 NuStep level 5 for 8 min- PT monitored patient progress DGI  22/24 ABC 1290 / 1600 = 80.6 %   BERG 51/56 SLS and tandem stance - multiple reps cues for glute engagment Discussed walking program at home - inside the house until weather cools   PATIENT EDUCATION:  Education details: Access Code: [email or web address] Person educated: Patient Education method: Explanation, Demonstration, and Handouts Education comprehension: verbalized understanding and returned demonstration  HOME EXERCISE PROGRAM: Access Code: [email or web address] URL: [email or web address] Date: 08/15/2023 Prepared by: Mliss  Exercises - Seated Long Arc Quad  - 3 x daily - 7 x weekly - 2 sets - 10 reps - 5 hold - Seated March   - 3 x daily - 7 x weekly - 3 sets - 10 reps - Seated Heel Toe Raises   - 3 x daily - 7 x weekly - 2 sets - 10 reps - Seated Hamstring Stretch  - 3 x daily - 7 x weekly - 1 sets - 3 reps - 20 hold - Seated Figure 4 Piriformis Stretch  - 1 x daily - 7 x weekly - 3 sets - 10 reps - Sit to Stand Without Arm Support  - 2 x daily - 7 x weekly - 1-2 sets - 10 reps - Standing Hip Abduction with Counter Support  - 1 x daily - 7 x weekly - 1-2 sets - 10 reps - Standing Hip Extension with Counter Support  - 1 x daily - 7 x weekly - 1-2 sets - 10 reps - Heel Raises with Counter Support  - 1 x daily - 7 x weekly - 2 sets - 10 reps - Forward Step Up  - 1 x daily - 7 x weekly - 2 sets - 10 reps - Tandem Stance with Support  - 1-2 x daily - 7 x weekly - 1 sets - 5 reps - max hold - Standing Single Leg Stance with Counter Support  - 1-2 x daily - 7 x weekly - 1 sets - 5 reps - max hold  Patient Education - walking program    ASSESSMENT:  CLINICAL IMPRESSION: Treatment session focused on single leg balance and balance on unstable surfaces. Single leg on therapad + opposite leg sway was a good challenge for patient; she required moderate use of UE support at the barre for support. Progressed single limb balance exercises to include arm movements this was a good challenge for patient. With agility ladder patient required use of stepping strategy to maintain balance at times.  Patient will benefit from skilled PT to address the below impairments and improve overall function.   OBJECTIVE IMPAIRMENTS: decreased activity tolerance, decreased balance, decreased endurance, difficulty walking, decreased strength, postural dysfunction, and pain.   ACTIVITY LIMITATIONS: standing, stairs, transfers, and locomotion level  PARTICIPATION LIMITATIONS: meal prep, cleaning, laundry, shopping, and community activity  PERSONAL FACTORS: Age, Time since onset of injury/illness/exacerbation, and 1 comorbidity: osteopenia are also affecting patient's functional outcome.   REHAB POTENTIAL: Good  CLINICAL DECISION MAKING: Stable/uncomplicated  EVALUATION COMPLEXITY: Low   GOALS: Goals reviewed with patient? Yes  SHORT TERM GOALS: Target date: 07/20/2023   Be independent in initial HEP Baseline: Goal status: in progress   2.  Report > or = to 50% less  use of Ues with sit to stand transition due to improved leg strength  Baseline:  Goal status: MET 07/20/23  3.  Improve endurance to report < or 4/10 RPE with 6 min walk test  Baseline:  Goal status: ONGOING 07/26/23      LONG TERM GOALS: Target date: 09/28/23    Be independent in advanced HEP Baseline:  Goal status: ONGOING 08/15/23  2.  Improve 6 min walk test to 1150 feet to improve community endurance  Baseline: 1008 feet  Goal status: ONGOING 08/08/23 1148 ft  3.  Report < or = 3/10 RPE with 6 min walk test due to improved endurance  Baseline:  Goal status: IN  PROGRESS 08/08/23  4.  Perform 5x sit to stand in < or = to 12 seconds to reduce falls risk Baseline:  Goal status: MET 08/02/23  5.  Initiate a walking program and verbalize safe progression to improve community endurance  Baseline:  Goal status: IN PROGRESS 08/15/23  6.  Report > or = to 50% improved confidence with balance in the community due to improved strength and stability  Baseline:  Goal status: MET 08/15/23 - more concerned with endurance  7.  Improved BERG score to >=53/56 demonstrating improved higher level balance Baseline:  Goal status: INITIAL    PLAN:  PT FREQUENCY: 2x/week  PT DURATION: 8 weeks  PLANNED INTERVENTIONS: 97110-Therapeutic exercises, 97530- Therapeutic activity, [id number]- Neuromuscular re-education, 97535- Self Care, [id number]- Manual therapy, [id number]- Gait training, [phone]- Canalith repositioning, [id number]- Aquatic Therapy, [phone] (1-2 muscles), [id number] (3+ muscles)- Dry Needling, Patient/Family education, Balance training, Stair training, Taping, Joint mobilization, Vestibular training, Cryotherapy, and Moist heat  PLAN FOR NEXT SESSION: continue split stance, narrow BOS, adding head turns with balance. Seated hip flexion strengthening, glute med strengthening, SLS and tandem stance    Kristeen Sar, PT 09/08/23 4:22 PM

## 2023-09-13 ENCOUNTER — Ambulatory Visit: Admitting: Physical Therapy

## 2023-09-13 ENCOUNTER — Encounter: Payer: Self-pay | Admitting: Physical Therapy

## 2023-09-13 DIAGNOSIS — R2689 Other abnormalities of gait and mobility: Secondary | ICD-10-CM | POA: Diagnosis not present

## 2023-09-13 DIAGNOSIS — M6281 Muscle weakness (generalized): Secondary | ICD-10-CM

## 2023-09-13 NOTE — Therapy (Signed)
 OUTPATIENT PHYSICAL THERAPY TREATMENT TREATMENT   Patient Name: Summer Collins MRN: [id number] DOB:Apr 24, 1940, 83 y.o., female Today's Date: 09/13/2023  END OF SESSION:  PT End of Session - 09/13/23 1536     Visit Number 14    Date for PT Re-Evaluation 09/28/23    Authorization Type Medicare B, Tricare (KX at 15)    Progress Note Due on Visit 20    PT Start Time 1535    PT Stop Time 1620    PT Time Calculation (min) 45 min                       Past Medical History:  Diagnosis Date   Anxiety     on low dose zoloft  for a while   Arthritis    Cancer (HCC)    kidneys   Depression    Hyperlipidemia    Osteopenia    Vitamin D  deficiency    Past Surgical History:  Procedure Laterality Date   CARPAL TUNNEL RELEASE     DILATION AND CURETTAGE OF UTERUS     x 3   ROBOT ASSISTED LAPAROSCOPIC NEPHRECTOMY Right 07/20/2019   Procedure: XI ROBOTIC ASSISTED LAPAROSCOPIC RADICAL NEPHRECTOMY;  Surgeon: Alvaro Hummer, MD;  Location: [facility];  Service: Urology;  Laterality: Right;  3 HRS   Patient Active Problem List   Diagnosis Date Noted   Right renal mass 07/20/2019   Nasal congestion 02/04/2014   Estrogen deficiency 02/04/2014   Medication management 02/04/2014   Medicare annual wellness visit, subsequent 02/04/2014   Postural dizziness 11/30/2013   Knee swelling 05/09/2012   Adverse drug effect 05/09/2012   Ocular rosacea 12/15/2011   Hx of nonmelanoma skin cancer 12/15/2011   Nonspecific abnormal results of liver function study 10/31/2010   Screening for ischemic heart disease 10/31/2010   Preventative health care 10/31/2010   Hormone replacement therapy (postmenopausal) 08/22/2010   Hyperlipidemia    Osteopenia    Anxiety    Anxiety     PCP: Loreli Fallow, MD  REFERRING PROVIDER: Loreli Fallow, MD  REFERRING DIAG:  Diagnosis  R26.81 (ICD-10-CM) - Unsteadiness on feet    THERAPY DIAG:  Muscle weakness (generalized)  Other abnormalities of gait  and mobility  Rationale for Evaluation and Treatment: Rehabilitation  ONSET DATE: chronic balance deficits-has been furniture walking  SUBJECTIVE:   SUBJECTIVE STATEMENT: I've had a busy day.  PERTINENT HISTORY: Osteopenia, anxiety, kidney-Rt removed (07/22/19)  PAIN: 08/10/23 Are you having pain? Yes: NPRS scale: 0/10 Pain location: bil hips Pain description: sore/aching  Aggravating factors: standing and walking, sidelying in bed  Relieving factors: movement   PRECAUTIONS: Fall  RED FLAGS: None   WEIGHT BEARING RESTRICTIONS: No  FALLS:  Has patient fallen in last 6 months? No  LIVING ENVIRONMENT: Lives with: lives alone Lives in: House/apartment Stairs: No Has following equipment at home: None  OCCUPATION: retired   PLOF: Independent and Leisure:  I need to walk  PATIENT GOALS: reduce falls risk, start walking   NEXT MD VISIT: none   OBJECTIVE:  Note: Objective measures were completed at Evaluation unless otherwise noted.  DIAGNOSTIC FINDINGS: bone scan for osteoporosis    COGNITION: Overall cognitive status: Within functional limits for tasks assessed     SENSATION: WFL    POSTURE: rounded shoulders and forward head  PALPATION: NA    LOWER EXTREMITY MMT:  Bil hips 4/5, knees 4+/5, ankles 4/5  08/15/23  R hip flex 4+/5, ABD 4/5, else 5/5  FUNCTIONAL TESTS:  09/13/23  08/15/23 DGI  22/24 ABC 1290 / 1600 = 80.6 %   BERG 51/56   08/08/23  - 1148 ft RPE 5/10  08/02/23: 5 times sit to stand: 11.14 sec no UE support TUG: 6.34 sec no UE support with transfers and a strong pivot turn   Eval: 5 times sit to stand: 15.15 seconds  Timed up and go (TUG): 9.53 seconds  6 minute walk test: 1008 ft with RPE 6/10- age related norm 1164 feet   GAIT: Distance walked: 100 feet  Assistive device utilized: None Level of assistance: Complete Independence Comments: mild staggering with fatigue, shortened step length                                                                                                                                  TREATMENT DATE:  09/13/23 NuStep level 5 for 6 min- PT monitored patient progress  Seated hip flexor stretch x 30 sec B - due to pt feeling her hip flexors Single leg on thera pad + opposite leg swing (forwards, lateral, backwards) x 8 bilateral unilateral UE at barre Tandem walking at barre (forwards & backwards) x 5 laps with UE support then one lap with no UE support Agility Ladder - multiple activities focusing on backward, diagonals and cross steps behind leg. Discussion of starting a walking program to address LTG  09/08/23 NuStep level 4 for 8 min- PT monitored patient progress Heel raise at barre standing on balance pad 2x10 Single leg on thera pad + opposite leg swing (forwards, lateral, backwards) x 8 bilateral unilateral UE at barre 6 inch step ups holding 5# DB x 10 bilateral  Tandem walking at barre (forwards & backwards) x 3 laps with UE support then one lap with no UE support Standing hip abduction and extension at barre: standing on foam pad 2x10 Standing on purple foam with 10 heads turns and nods (did not required UE support) Agility Ladder( high march, sideways, in/out) x 1 lap each Shoulder extension with red TB + march x 20 total  5# Unilateral farmer's carry x 1 lap around gym    09/06/23 NuStep level 5 for 8 min- PT monitored patient progress Heel raise at barre standing on balance pad 2x10 Standing  narrow BOS for 30 sec on purple foam no UE support  Walking on foam - cues to raise R foot higher Standing on purple foam with 10 heads turns and nods Modified tandem stance 2x20 seconds Rt and Lt without UE support and then with head turns  SLS taps to 1st and 2nd steps 2x 20 B Standing abduction and extension at barre: standing on foam pad 2x10 SLS step taps to 6 in step, standing leg on purple foam pad 2x 10 each leg Sit to stand 5lb with chest  press 2x10 (do from hi/lo table to challenge pt with lower surface.    08/18/23  NuStep level 5 for 8 min- PT monitored patient progress Heel raise at barre standing on balance pad 2x10 Standing for 30 sec on purple foam no UE support  Standing on purple foam with 2x 5 heads turn on both sides Modified tandem stance 2x20 seconds Rt and Lt without UE support Feet together with head turns and head up/down x30 seconds each  Marches in sitting with 1 lb weight 2x20  Standing abduction and extension at barre: standing on foam pad 2x10 SLS step taps to 6 in step, standing leg on purple foam pad 2x 10 each leg Sit to stand 5lb with chest press 2x10   08/15/23 NuStep level 5 for 8 min- PT monitored patient progress DGI  22/24 ABC 1290 / 1600 = 80.6 %   BERG 51/56 SLS and tandem stance - multiple reps cues for glute engagment Discussed walking program at home - inside the house until weather cools   PATIENT EDUCATION:  Education details: Access Code: [email or web address] Person educated: Patient Education method: Explanation, Demonstration, and Handouts Education comprehension: verbalized understanding and returned demonstration  HOME EXERCISE PROGRAM: Access Code: [email or web address] URL: [email or web address] Date: 08/15/2023 Prepared by: Mliss  Exercises - Seated Long Arc Quad  - 3 x daily - 7 x weekly - 2 sets - 10 reps - 5 hold - Seated March   - 3 x daily - 7 x weekly - 3 sets - 10 reps - Seated Heel Toe Raises   - 3 x daily - 7 x weekly - 2 sets - 10 reps - Seated Hamstring Stretch  - 3 x daily - 7 x weekly - 1 sets - 3 reps - 20 hold - Seated Figure 4 Piriformis Stretch  - 1 x daily - 7 x weekly - 3 sets - 10 reps - Sit to Stand Without Arm Support  - 2 x daily - 7 x weekly - 1-2 sets - 10 reps - Standing Hip Abduction with Counter Support  - 1 x daily - 7 x weekly - 1-2 sets - 10 reps - Standing Hip Extension with Counter Support  - 1 x daily - 7 x weekly - 1-2 sets - 10 reps - Heel  Raises with Counter Support  - 1 x daily - 7 x weekly - 2 sets - 10 reps - Forward Step Up  - 1 x daily - 7 x weekly - 2 sets - 10 reps - Tandem Stance with Support  - 1-2 x daily - 7 x weekly - 1 sets - 5 reps - max hold - Standing Single Leg Stance with Counter Support  - 1-2 x daily - 7 x weekly - 1 sets - 5 reps - max hold  Patient Education - walking program   ASSESSMENT:  CLINICAL IMPRESSION: Patient met her for distance today, but still reports RPE of 5/10. This may have been due to her doing the Nustep first. She is very challenged with SL foam, tandem walking (forward more than bwd), and agility ladder activities. Encouraged pt to start a regular walking program. Check appt availability next week for patient. KX next visit.   OBJECTIVE IMPAIRMENTS: decreased activity tolerance, decreased balance, decreased endurance, difficulty walking, decreased strength, postural dysfunction, and pain.   ACTIVITY LIMITATIONS: standing, stairs, transfers, and locomotion level  PARTICIPATION LIMITATIONS: meal prep, cleaning, laundry, shopping, and community activity  PERSONAL FACTORS: Age, Time since onset of injury/illness/exacerbation, and 1 comorbidity: osteopenia are also affecting patient's functional outcome.   REHAB POTENTIAL: Good  CLINICAL DECISION MAKING: Stable/uncomplicated  EVALUATION COMPLEXITY: Low   GOALS: Goals reviewed with patient? Yes  SHORT TERM GOALS: Target date: 07/20/2023   Be independent in initial HEP Baseline: Goal status: in progress   2.  Report > or = to 50% less use of Ues with sit to stand transition due to improved leg strength  Baseline:  Goal status: MET 07/20/23  3.  Improve endurance to report < or 4/10 RPE with 6 min walk test  Baseline:  Goal status: ONGOING 07/26/23      LONG TERM GOALS: Target date: 09/28/23    Be independent in advanced HEP Baseline:  Goal status: ONGOING 08/15/23  2.  Improve 6 min walk test to 1150 feet to  improve community endurance  Baseline: 1008 feet  Goal status: MET 09/13/23 1241 ft  3.  Report < or = 3/10 RPE with 6 min walk test due to improved endurance  Baseline:  Goal status: IN PROGRESS 09/13/23 5/10  4.  Perform 5x sit to stand in < or = to 12 seconds to reduce falls risk Baseline:  Goal status: MET 08/02/23  5.  Initiate a walking program and verbalize safe progression to improve community endurance  Baseline:  Goal status: IN PROGRESS 08/15/23  6.  Report > or = to 50% improved confidence with balance in the community due to improved strength and stability  Baseline:  Goal status: MET 08/15/23 - more concerned with endurance  7.  Improved BERG score to >=53/56 demonstrating improved higher level balance Baseline:  Goal status: INITIAL    PLAN:  PT FREQUENCY: 2x/week  PT DURATION: 8 weeks  PLANNED INTERVENTIONS: 97110-Therapeutic exercises, 97530- Therapeutic activity, [id number]- Neuromuscular re-education, 97535- Self Care, 02859- Manual therapy, [id number]- Gait training, [phone]- Canalith repositioning, [id number]- Aquatic Therapy, [phone] (1-2 muscles), 20561 (3+ muscles)- Dry Needling, Patient/Family education, Balance training, Stair training, Taping, Joint mobilization, Vestibular training, Cryotherapy, and Moist heat  PLAN FOR NEXT SESSION: continue SL stance on foam, tandem walking, agility ladder, split stance, narrow BOS,  head turns with balance. Seated hip flexion strengthening, glute med strengthening, SLS and tandem stance    Mliss Cummins, PT  09/13/23 4:34 PM

## 2023-09-15 ENCOUNTER — Ambulatory Visit

## 2023-09-15 DIAGNOSIS — M6281 Muscle weakness (generalized): Secondary | ICD-10-CM | POA: Diagnosis not present

## 2023-09-15 DIAGNOSIS — R2689 Other abnormalities of gait and mobility: Secondary | ICD-10-CM | POA: Diagnosis not present

## 2023-09-15 NOTE — Therapy (Signed)
 OUTPATIENT PHYSICAL THERAPY TREATMENT TREATMENT   Patient Name: Summer Collins MRN: [id number] DOB:Apr 11, 1940, 83 y.o., female Today's Date: 09/15/2023  END OF SESSION:  PT End of Session - 09/15/23 1314     Visit Number 15    Date for PT Re-Evaluation 09/28/23    Authorization Type Medicare B, [employer] ([place] now)    Progress Note Due on Visit 20    PT Start Time 1232    PT Stop Time 1314    PT Time Calculation (min) 42 min    Activity Tolerance Patient tolerated treatment well    Behavior During Therapy WFL for tasks assessed/performed                        Past Medical History:  Diagnosis Date   Anxiety     on low dose zoloft  for a while   Arthritis    Cancer (HCC)    kidneys   Depression    Hyperlipidemia    Osteopenia    Vitamin D  deficiency    Past Surgical History:  Procedure Laterality Date   CARPAL TUNNEL RELEASE     DILATION AND CURETTAGE OF UTERUS     x 3   ROBOT ASSISTED LAPAROSCOPIC NEPHRECTOMY Right 07/20/2019   Procedure: XI ROBOTIC ASSISTED LAPAROSCOPIC RADICAL NEPHRECTOMY;  Surgeon: Alvaro Hummer, MD;  Location: [facility];  Service: Urology;  Laterality: Right;  3 HRS   Patient Active Problem List   Diagnosis Date Noted   Right renal mass 07/20/2019   Nasal congestion 02/04/2014   Estrogen deficiency 02/04/2014   Medication management 02/04/2014   Medicare annual wellness visit, subsequent 02/04/2014   Postural dizziness 11/30/2013   Knee swelling 05/09/2012   Adverse drug effect 05/09/2012   Ocular rosacea 12/15/2011   Hx of nonmelanoma skin cancer 12/15/2011   Nonspecific abnormal results of liver function study 10/31/2010   Screening for ischemic heart disease 10/31/2010   Preventative health care 10/31/2010   Hormone replacement therapy (postmenopausal) 08/22/2010   Hyperlipidemia    Osteopenia    Anxiety    Anxiety     PCP: Loreli Fallow, MD  REFERRING PROVIDER: Loreli Fallow, MD  REFERRING DIAG:  Diagnosis  R26.81  (ICD-10-CM) - Unsteadiness on feet    THERAPY DIAG:  Muscle weakness (generalized)  Other abnormalities of gait and mobility  Rationale for Evaluation and Treatment: Rehabilitation  ONSET DATE: chronic balance deficits-has been furniture walking  SUBJECTIVE:   SUBJECTIVE STATEMENT: I've had a busy day.  PERTINENT HISTORY: Osteopenia, anxiety, kidney-Rt removed (07/22/19)  PAIN: 08/10/23 Are you having pain? Yes: NPRS scale: 0/10 Pain location: bil hips Pain description: sore/aching  Aggravating factors: standing and walking, sidelying in bed  Relieving factors: movement   PRECAUTIONS: Fall  RED FLAGS: None   WEIGHT BEARING RESTRICTIONS: No  FALLS:  Has patient fallen in last 6 months? No  LIVING ENVIRONMENT: Lives with: lives alone Lives in: House/apartment Stairs: No Has following equipment at home: None  OCCUPATION: retired   PLOF: Independent and Leisure:  I need to walk  PATIENT GOALS: reduce falls risk, start walking   NEXT MD VISIT: none   OBJECTIVE:  Note: Objective measures were completed at Evaluation unless otherwise noted.  DIAGNOSTIC FINDINGS: bone scan for osteoporosis    COGNITION: Overall cognitive status: Within functional limits for tasks assessed     SENSATION: WFL    POSTURE: rounded shoulders and forward head  PALPATION: NA    LOWER EXTREMITY MMT:  Bil hips 4/5, knees 4+/5, ankles 4/5  08/15/23  R hip flex 4+/5, ABD 4/5, else 5/5   FUNCTIONAL TESTS:  09/13/23  08/15/23 DGI  22/24 ABC 1290 / 1600 = 80.6 %   BERG 51/56   08/08/23  - 1148 ft RPE 5/10  08/02/23: 5 times sit to stand: 11.14 sec no UE support TUG: 6.34 sec no UE support with transfers and a strong pivot turn   Eval: 5 times sit to stand: 15.15 seconds  Timed up and go (TUG): 9.53 seconds  6 minute walk test: 1008 ft with RPE 6/10- age related norm 1164 feet   GAIT: Distance walked: 100 feet  Assistive device utilized:  None Level of assistance: Complete Independence Comments: mild staggering with fatigue, shortened step length                                                                                                                                 TREATMENT DATE:  09/15/23 NuStep level 5 for 8 min- PT monitored patient progress Seated hip flexor stretch x 30 sec B - using 2nd step Single leg on blue thera pad + opposite leg swing (forwards, lateral, backwards) x 8 bilateral unilateral UE at barre Tandem walking at barre (forwards & backwards) x 5 laps with UE support then one lap with no UE support hurdles - forward and lateral step-to without UE support- Close CGA by PT Sit to stand 10# x10 Farmer's carry: 5# around building each hand  Alternating step taps 6 step x20    09/13/23 NuStep level 5 for 6 min- PT monitored patient progress  Seated hip flexor stretch x 30 sec B - due to pt feeling her hip flexors Single leg on thera pad + opposite leg swing (forwards, lateral, backwards) x 8 bilateral unilateral UE at barre Tandem walking at barre (forwards & backwards) x 5 laps with UE support then one lap with no UE support Agility Ladder - multiple activities focusing on backward, diagonals and cross steps behind leg. Discussion of starting a walking program to address LTG  09/08/23 NuStep level 4 for 8 min- PT monitored patient progress Heel raise at barre standing on balance pad 2x10 Single leg on thera pad + opposite leg swing (forwards, lateral, backwards) x 8 bilateral unilateral UE at barre 6 inch step ups holding 5# DB x 10 bilateral  Tandem walking at barre (forwards & backwards) x 3 laps with UE support then one lap with no UE support Standing hip abduction and extension at barre: standing on foam pad 2x10 Standing on purple foam with 10 heads turns and nods (did not required UE support) Agility Ladder( high march, sideways, in/out) x 1 lap each Shoulder extension with red TB +  march x 20 total  5# Unilateral farmer's carry x 1 lap around gym    PATIENT EDUCATION:  Education details: Access Code: [email or web address] Person educated: Patient Education method:  Explanation, Demonstration, and Handouts Education comprehension: verbalized understanding and returned demonstration  HOME EXERCISE PROGRAM: Access Code: [email or web address] URL: [email or web address] Date: 08/15/2023 Prepared by: Mliss  Exercises - Seated Long Arc Quad  - 3 x daily - 7 x weekly - 2 sets - 10 reps - 5 hold - Seated March   - 3 x daily - 7 x weekly - 3 sets - 10 reps - Seated Heel Toe Raises   - 3 x daily - 7 x weekly - 2 sets - 10 reps - Seated Hamstring Stretch  - 3 x daily - 7 x weekly - 1 sets - 3 reps - 20 hold - Seated Figure 4 Piriformis Stretch  - 1 x daily - 7 x weekly - 3 sets - 10 reps - Sit to Stand Without Arm Support  - 2 x daily - 7 x weekly - 1-2 sets - 10 reps - Standing Hip Abduction with Counter Support  - 1 x daily - 7 x weekly - 1-2 sets - 10 reps - Standing Hip Extension with Counter Support  - 1 x daily - 7 x weekly - 1-2 sets - 10 reps - Heel Raises with Counter Support  - 1 x daily - 7 x weekly - 2 sets - 10 reps - Forward Step Up  - 1 x daily - 7 x weekly - 2 sets - 10 reps - Tandem Stance with Support  - 1-2 x daily - 7 x weekly - 1 sets - 5 reps - max hold - Standing Single Leg Stance with Counter Support  - 1-2 x daily - 7 x weekly - 1 sets - 5 reps - max hold  Patient Education - walking program   ASSESSMENT:  CLINICAL IMPRESSION: Pt continues to benefit from strength, mobility and balance progression.  She continues to be challenged by balance exercises and PT provided supervision and guard for safety.  Pt plans to initiate a walking program now that weather has cooled down.  Patient will benefit from skilled PT to address the below impairments and improve overall function.    OBJECTIVE IMPAIRMENTS: decreased activity tolerance, decreased balance, decreased  endurance, difficulty walking, decreased strength, postural dysfunction, and pain.   ACTIVITY LIMITATIONS: standing, stairs, transfers, and locomotion level  PARTICIPATION LIMITATIONS: meal prep, cleaning, laundry, shopping, and community activity  PERSONAL FACTORS: Age, Time since onset of injury/illness/exacerbation, and 1 comorbidity: osteopenia are also affecting patient's functional outcome.   REHAB POTENTIAL: Good  CLINICAL DECISION MAKING: Stable/uncomplicated  EVALUATION COMPLEXITY: Low   GOALS: Goals reviewed with patient? Yes  SHORT TERM GOALS: Target date: 07/20/2023   Be independent in initial HEP Baseline: Goal status: in progress   2.  Report > or = to 50% less use of Ues with sit to stand transition due to improved leg strength  Baseline:  Goal status: MET 07/20/23  3.  Improve endurance to report < or 4/10 RPE with 6 min walk test  Baseline:  Goal status: ONGOING 07/26/23      LONG TERM GOALS: Target date: 09/28/23    Be independent in advanced HEP Baseline:  Goal status: ONGOING 08/15/23  2.  Improve 6 min walk test to 1150 feet to improve community endurance  Baseline: 1008 feet  Goal status: MET 09/13/23 1241 ft  3.  Report < or = 3/10 RPE with 6 min walk test due to improved endurance  Baseline:  Goal status: IN PROGRESS 09/13/23 5/10  4.  Perform 5x sit to stand in <  or = to 12 seconds to reduce falls risk Baseline:  Goal status: MET 08/02/23  5.  Initiate a walking program and verbalize safe progression to improve community endurance  Baseline:  Goal status: IN PROGRESS 08/15/23  6.  Report > or = to 50% improved confidence with balance in the community due to improved strength and stability  Baseline:  Goal status: MET 08/15/23 - more concerned with endurance  7.  Improved BERG score to >=53/56 demonstrating improved higher level balance Baseline:  Goal status: INITIAL    PLAN:  PT FREQUENCY: 2x/week  PT DURATION: 8 weeks  PLANNED  INTERVENTIONS: 97110-Therapeutic exercises, 97530- Therapeutic activity, [id number]- Neuromuscular re-education, 97535- Self Care, 02859- Manual therapy, [id number]- Gait training, [phone]- Canalith repositioning, [id number]- Aquatic Therapy, [phone] (1-2 muscles), [id number] (3+ muscles)- Dry Needling, Patient/Family education, Balance training, Stair training, Taping, Joint mobilization, Vestibular training, Cryotherapy, and Moist heat  PLAN FOR NEXT SESSION: continue SL stance on foam, tandem walking, agility ladder, split stance, narrow BOS,  head turns with balance. Seated hip flexion strengthening, glute med strengthening, SLS and tandem stance    Burnard Joy, PT 09/15/23 1:15 PM

## 2023-09-28 ENCOUNTER — Ambulatory Visit: Admitting: Physical Therapy

## 2023-09-29 ENCOUNTER — Ambulatory Visit: Admitting: Physical Therapy

## 2023-10-06 ENCOUNTER — Ambulatory Visit: Attending: Internal Medicine

## 2023-10-06 DIAGNOSIS — R252 Cramp and spasm: Secondary | ICD-10-CM | POA: Insufficient documentation

## 2023-10-06 DIAGNOSIS — R262 Difficulty in walking, not elsewhere classified: Secondary | ICD-10-CM | POA: Insufficient documentation

## 2023-10-06 DIAGNOSIS — R293 Abnormal posture: Secondary | ICD-10-CM | POA: Diagnosis not present

## 2023-10-06 DIAGNOSIS — M6281 Muscle weakness (generalized): Secondary | ICD-10-CM | POA: Diagnosis not present

## 2023-10-06 NOTE — Therapy (Signed)
 OUTPATIENT PHYSICAL THERAPY TREATMENT TREATMENT PHYSICAL THERAPY DISCHARGE SUMMARY  Visits from Start of Care: 16  Current functional level related to goals / functional outcomes: See below   Remaining deficits: See below   Education / Equipment: See below   Patient agrees to discharge. Patient goals were partially met. Patient is being discharged due to maximized rehab potential.     Patient Name: Summer Collins MRN: [id number] DOB:May 11, 1940, 83 y.o., female Today's Date: 10/06/2023  END OF SESSION:  PT End of Session - 10/06/23 1403     Visit Number 16    Date for Recertification  09/28/23    Authorization Type Medicare B, [employer]are ([place] now)    PT Start Time 1403    PT Stop Time 1450    PT Time Calculation (min) 47 min    Activity Tolerance Patient tolerated treatment well    Behavior During Therapy WFL for tasks assessed/performed           Past Medical History:  Diagnosis Date   Anxiety     on low dose zoloft  for a while   Arthritis    Cancer (HCC)    kidneys   Depression    Hyperlipidemia    Osteopenia    Vitamin D  deficiency    Past Surgical History:  Procedure Laterality Date   CARPAL TUNNEL RELEASE     DILATION AND CURETTAGE OF UTERUS     x 3   ROBOT ASSISTED LAPAROSCOPIC NEPHRECTOMY Right 07/20/2019   Procedure: XI ROBOTIC ASSISTED LAPAROSCOPIC RADICAL NEPHRECTOMY;  Surgeon: Alvaro Hummer, MD;  Location: [facility];  Service: Urology;  Laterality: Right;  3 HRS   Patient Active Problem List   Diagnosis Date Noted   Right renal mass 07/20/2019   Nasal congestion 02/04/2014   Estrogen deficiency 02/04/2014   Medication management 02/04/2014   Medicare annual wellness visit, subsequent 02/04/2014   Postural dizziness 11/30/2013   Knee swelling 05/09/2012   Adverse drug effect 05/09/2012   Ocular rosacea 12/15/2011   Hx of nonmelanoma skin cancer 12/15/2011   Nonspecific abnormal results of liver function study 10/31/2010   Screening for ischemic  heart disease 10/31/2010   Preventative health care 10/31/2010   Hormone replacement therapy (postmenopausal) 08/22/2010   Hyperlipidemia    Osteopenia    Anxiety    Anxiety     PCP: Loreli Fallow, MD  REFERRING PROVIDER: Loreli Fallow, MD  REFERRING DIAG:  Diagnosis  R26.81 (ICD-10-CM) - Unsteadiness on feet    THERAPY DIAG:  Difficulty in walking, not elsewhere classified  Muscle weakness (generalized)  Cramp and spasm  Abnormal posture  Rationale for Evaluation and Treatment: Rehabilitation  ONSET DATE: chronic balance deficits-has been furniture walking  SUBJECTIVE:   SUBJECTIVE STATEMENT: I've had a busy day.  PERTINENT HISTORY: Osteopenia, anxiety, kidney-Rt removed (07/22/19)  PAIN: 08/10/23 Are you having pain? Yes: NPRS scale: 0/10 Pain location: bil hips Pain description: sore/aching  Aggravating factors: standing and walking, sidelying in bed  Relieving factors: movement   PRECAUTIONS: Fall  RED FLAGS: None   WEIGHT BEARING RESTRICTIONS: No  FALLS:  Has patient fallen in last 6 months? No  LIVING ENVIRONMENT: Lives with: lives alone Lives in: House/apartment Stairs: No Has following equipment at home: None  OCCUPATION: retired   PLOF: Independent and Leisure:  I need to walk  PATIENT GOALS: reduce falls risk, start walking   NEXT MD VISIT: none   OBJECTIVE:  Note: Objective measures were completed at Evaluation unless otherwise noted.  DIAGNOSTIC  FINDINGS: bone scan for osteoporosis    COGNITION: Overall cognitive status: Within functional limits for tasks assessed     SENSATION: WFL    POSTURE: rounded shoulders and forward head  PALPATION: NA    LOWER EXTREMITY MMT:  Bil hips 4/5, knees 4+/5, ankles 4/5  08/15/23  R hip flex 4+/5, ABD 4/5, else 5/5   FUNCTIONAL TESTS:  10/06/23 5 times sit to stand: 9.79 sec no UE support TUG: 5.96 sec no UE support with transfers and a strong pivot turn  ABC scale:  1150/1600= 71.9%   09/13/23  08/15/23 DGI  22/24 ABC 1290 / 1600 = 80.6 %   BERG 51/56   08/08/23  - 1148 ft RPE 5/10  08/02/23: 5 times sit to stand: 11.14 sec no UE support TUG: 6.34 sec no UE support with transfers and a strong pivot turn   Eval: 5 times sit to stand: 15.15 seconds  Timed up and go (TUG): 9.53 seconds  6 minute walk test: 1008 ft with RPE 6/10- age related norm 1164 feet   GAIT: Distance walked: 100 feet  Assistive device utilized: None Level of assistance: Complete Independence Comments: mild staggering with fatigue, shortened step length                                                                                                                                 TREATMENT DATE:  10/06/23 NuStep level 5 for 9 min- PT monitored patient progress Re-assessment completed: patient quite talkative so difficult to complete all testing today.    Seated hip flexor stretch x 30 sec B - using 2nd step Single leg on blue thera pad + opposite leg swing (forwards, lateral, backwards) x 8 bilateral unilateral UE at barre Tandem walking at barre (forwards & backwards) x 5 laps with UE support then one lap with no UE support hurdles - forward and lateral step-to without UE support- Close CGA by PT Sit to stand 10# x10 Farmer's carry: 5# around building each hand  Alternating step taps 6 step x20   09/15/23 NuStep level 5 for 8 min- PT monitored patient progress Seated hip flexor stretch x 30 sec B - using 2nd step Single leg on blue thera pad + opposite leg swing (forwards, lateral, backwards) x 8 bilateral unilateral UE at barre Tandem walking at barre (forwards & backwards) x 5 laps with UE support then one lap with no UE support hurdles - forward and lateral step-to without UE support- Close CGA by PT Sit to stand 10# x10 Farmer's carry: 5# around building each hand  Alternating step taps 6 step x20    09/13/23 NuStep level 5 for 6 min- PT  monitored patient progress  Seated hip flexor stretch x 30 sec B - due to pt feeling her hip flexors Single leg on thera pad + opposite leg swing (forwards, lateral, backwards) x 8 bilateral unilateral UE at barre Tandem  walking at barre (forwards & backwards) x 5 laps with UE support then one lap with no UE support Agility Ladder - multiple activities focusing on backward, diagonals and cross steps behind leg. Discussion of starting a walking program to address LTG  09/08/23 NuStep level 4 for 8 min- PT monitored patient progress Heel raise at barre standing on balance pad 2x10 Single leg on thera pad + opposite leg swing (forwards, lateral, backwards) x 8 bilateral unilateral UE at barre 6 inch step ups holding 5# DB x 10 bilateral  Tandem walking at barre (forwards & backwards) x 3 laps with UE support then one lap with no UE support Standing hip abduction and extension at barre: standing on foam pad 2x10 Standing on purple foam with 10 heads turns and nods (did not required UE support) Agility Ladder( high march, sideways, in/out) x 1 lap each Shoulder extension with red TB + march x 20 total  5# Unilateral farmer's carry x 1 lap around gym    PATIENT EDUCATION:  Education details: Access Code: [email or web address] Person educated: Patient Education method: Explanation, Demonstration, and Handouts Education comprehension: verbalized understanding and returned demonstration  HOME EXERCISE PROGRAM: Access Code: [email or web address] URL: [email or web address] Date: 08/15/2023 Prepared by: Mliss  Exercises - Seated Long Arc Quad  - 3 x daily - 7 x weekly - 2 sets - 10 reps - 5 hold - Seated March   - 3 x daily - 7 x weekly - 3 sets - 10 reps - Seated Heel Toe Raises   - 3 x daily - 7 x weekly - 2 sets - 10 reps - Seated Hamstring Stretch  - 3 x daily - 7 x weekly - 1 sets - 3 reps - 20 hold - Seated Figure 4 Piriformis Stretch  - 1 x daily - 7 x weekly - 3 sets - 10 reps - Sit to  Stand Without Arm Support  - 2 x daily - 7 x weekly - 1-2 sets - 10 reps - Standing Hip Abduction with Counter Support  - 1 x daily - 7 x weekly - 1-2 sets - 10 reps - Standing Hip Extension with Counter Support  - 1 x daily - 7 x weekly - 1-2 sets - 10 reps - Heel Raises with Counter Support  - 1 x daily - 7 x weekly - 2 sets - 10 reps - Forward Step Up  - 1 x daily - 7 x weekly - 2 sets - 10 reps - Tandem Stance with Support  - 1-2 x daily - 7 x weekly - 1 sets - 5 reps - max hold - Standing Single Leg Stance with Counter Support  - 1-2 x daily - 7 x weekly - 1 sets - 5 reps - max hold  Patient Education - walking program   ASSESSMENT:  CLINICAL IMPRESSION: Alta has met a plateau at this time.  She is functioning at her baseline level.  She continues to furniture walk at times.  Her objective findings are improved with exception of ABC scale which is about the same.  She admits she only does a few of the exercises at home and she has not yet started her walking program.  We reviewed all of HEP and encouraged consistency with HEP and importance of walking program.  We will DC at this time for patient to continue her HEP independently.     OBJECTIVE IMPAIRMENTS: decreased activity tolerance, decreased balance, decreased endurance, difficulty walking, decreased strength, postural dysfunction, and pain.  ACTIVITY LIMITATIONS: standing, stairs, transfers, and locomotion level  PARTICIPATION LIMITATIONS: meal prep, cleaning, laundry, shopping, and community activity  PERSONAL FACTORS: Age, Time since onset of injury/illness/exacerbation, and 1 comorbidity: osteopenia are also affecting patient's functional outcome.   REHAB POTENTIAL: Good  CLINICAL DECISION MAKING: Stable/uncomplicated  EVALUATION COMPLEXITY: Low   GOALS: Goals reviewed with patient? Yes  SHORT TERM GOALS: Target date: 07/20/2023   Be independent in initial HEP Baseline: Goal status: NOT MET (minimal compliance)    2.  Report > or = to 50% less use of Ues with sit to stand transition due to improved leg strength  Baseline:  Goal status: MET 07/20/23  3.  Improve endurance to report < or 4/10 RPE with 6 min walk test  Baseline:  Goal status: MET 10/06/23      LONG TERM GOALS: Target date: 09/28/23    Be independent in advanced HEP Baseline:  Goal status: NOT MET   2.  Improve 6 min walk test to 1150 feet to improve community endurance  Baseline: 1008 feet  Goal status: MET 09/13/23 1241 ft  3.  Report < or = 3/10 RPE with 6 min walk test due to improved endurance  Baseline:  Goal status: NOT MET  4.  Perform 5x sit to stand in < or = to 12 seconds to reduce falls risk Baseline:  Goal status: MET 08/02/23  5.  Initiate a walking program and verbalize safe progression to improve community endurance  Baseline:  Goal status: NOT MET  6.  Report > or = to 50% improved confidence with balance in the community due to improved strength and stability  Baseline:  Goal status: MET 08/15/23 - more concerned with endurance  7.  Improved BERG score to >=53/56 demonstrating improved higher level balance Baseline:  Goal status: REVISED did not retest due to multiple other tests    PLAN:  PT FREQUENCY: 2x/week  PT DURATION: 8 weeks  PLANNED INTERVENTIONS: 97110-Therapeutic exercises, 97530- Therapeutic activity, [id number]- Neuromuscular re-education, 97535- Self Care, 02859- Manual therapy, [id number]- Gait training, [phone]- Canalith repositioning, [id number]- Aquatic Therapy, [phone] (1-2 muscles), 20561 (3+ muscles)- Dry Needling, Patient/Family education, Balance training, Stair training, Taping, Joint mobilization, Vestibular training, Cryotherapy, and Moist heat  PLAN FOR NEXT SESSION: DC for patient to continue HEP independently.     Delon B. Duke Weisensel, PT 10/06/23 3:45 PM [facility] [address], [address] [place], [place] [address] Phone # [phone] Fax  [phone]

## 2023-10-06 NOTE — Addendum Note (Signed)
 Addended by: HARVEY NEST B on: 10/06/2023 03:59 PM   Modules accepted: Orders

## 2023-11-28 DIAGNOSIS — Z1231 Encounter for screening mammogram for malignant neoplasm of breast: Secondary | ICD-10-CM | POA: Diagnosis not present
